# Patient Record
Sex: Female | Born: 1935 | Race: White | Hispanic: No | Smoking: Never smoker
Health system: Southern US, Community
[De-identification: ages and names within clinical notes are randomized; demographics above are authoritative.]

## PROBLEM LIST (undated history)

## (undated) DIAGNOSIS — I519 Heart disease, unspecified: Secondary | ICD-10-CM

## (undated) DIAGNOSIS — Z950 Presence of cardiac pacemaker: Secondary | ICD-10-CM

## (undated) DIAGNOSIS — Z8619 Personal history of other infectious and parasitic diseases: Secondary | ICD-10-CM

## (undated) DIAGNOSIS — I10 Essential (primary) hypertension: Secondary | ICD-10-CM

## (undated) DIAGNOSIS — E785 Hyperlipidemia, unspecified: Secondary | ICD-10-CM

## (undated) DIAGNOSIS — M1990 Unspecified osteoarthritis, unspecified site: Secondary | ICD-10-CM

## (undated) DIAGNOSIS — C801 Malignant (primary) neoplasm, unspecified: Secondary | ICD-10-CM

## (undated) DIAGNOSIS — T7840XA Allergy, unspecified, initial encounter: Secondary | ICD-10-CM

## (undated) DIAGNOSIS — K635 Polyp of colon: Secondary | ICD-10-CM

## (undated) HISTORY — DX: Personal history of other infectious and parasitic diseases: Z86.19

## (undated) HISTORY — DX: Allergy, unspecified, initial encounter: T78.40XA

## (undated) HISTORY — PX: EYE SURGERY: SHX253

## (undated) HISTORY — DX: Heart disease, unspecified: I51.9

## (undated) HISTORY — DX: Unspecified osteoarthritis, unspecified site: M19.90

## (undated) HISTORY — DX: Polyp of colon: K63.5

## (undated) HISTORY — DX: Essential (primary) hypertension: I10

## (undated) HISTORY — PX: SPINE SURGERY: SHX786

## (undated) HISTORY — DX: Hyperlipidemia, unspecified: E78.5

## (undated) HISTORY — DX: Malignant (primary) neoplasm, unspecified: C80.1

---
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
## 1968-04-06 HISTORY — PX: TUBAL LIGATION: SHX77

## 2000-11-02 LAB — HM COLONOSCOPY

## 2003-09-28 HISTORY — PX: BREAST BIOPSY: SHX20

## 2005-11-25 HISTORY — PX: CATARACT EXTRACTION: SUR2

## 2007-01-19 LAB — HM COLONOSCOPY

## 2007-03-08 HISTORY — PX: ANGIOPLASTY: SHX39

## 2012-11-24 HISTORY — PX: CERVICAL SPINE SURGERY: SHX589

## 2016-04-08 DIAGNOSIS — I10 Essential (primary) hypertension: Secondary | ICD-10-CM | POA: Diagnosis not present

## 2016-04-08 DIAGNOSIS — M542 Cervicalgia: Secondary | ICD-10-CM | POA: Diagnosis not present

## 2016-04-08 DIAGNOSIS — E785 Hyperlipidemia, unspecified: Secondary | ICD-10-CM | POA: Diagnosis not present

## 2016-04-20 DIAGNOSIS — I10 Essential (primary) hypertension: Secondary | ICD-10-CM | POA: Diagnosis not present

## 2016-04-20 DIAGNOSIS — E785 Hyperlipidemia, unspecified: Secondary | ICD-10-CM | POA: Diagnosis not present

## 2016-09-16 DIAGNOSIS — M542 Cervicalgia: Secondary | ICD-10-CM | POA: Diagnosis not present

## 2016-09-16 DIAGNOSIS — I10 Essential (primary) hypertension: Secondary | ICD-10-CM | POA: Insufficient documentation

## 2016-09-16 DIAGNOSIS — G8929 Other chronic pain: Secondary | ICD-10-CM | POA: Diagnosis not present

## 2016-09-16 DIAGNOSIS — M545 Low back pain: Secondary | ICD-10-CM | POA: Diagnosis not present

## 2016-09-16 DIAGNOSIS — E785 Hyperlipidemia, unspecified: Secondary | ICD-10-CM | POA: Diagnosis not present

## 2016-09-16 DIAGNOSIS — J301 Allergic rhinitis due to pollen: Secondary | ICD-10-CM | POA: Insufficient documentation

## 2016-09-16 DIAGNOSIS — C4490 Unspecified malignant neoplasm of skin, unspecified: Secondary | ICD-10-CM | POA: Insufficient documentation

## 2016-10-12 DIAGNOSIS — I10 Essential (primary) hypertension: Secondary | ICD-10-CM | POA: Diagnosis not present

## 2016-10-12 DIAGNOSIS — E785 Hyperlipidemia, unspecified: Secondary | ICD-10-CM | POA: Diagnosis not present

## 2016-10-12 LAB — BASIC METABOLIC PANEL
BUN: 19 (ref 4–21)
CREATININE: 0.7 (ref 0.5–1.1)
Glucose: 95
POTASSIUM: 5.2 (ref 3.4–5.3)
SODIUM: 140 (ref 137–147)

## 2016-10-12 LAB — LIPID PANEL
CHOLESTEROL: 137 (ref 0–200)
HDL: 62 (ref 35–70)
LDL Cholesterol: 61
TRIGLYCERIDES: 77 (ref 40–160)

## 2016-10-12 LAB — HEPATIC FUNCTION PANEL
ALT: 33 (ref 7–35)
AST: 35 (ref 13–35)
Alkaline Phosphatase: 57 (ref 25–125)
Bilirubin, Total: 0.4

## 2016-10-12 LAB — CBC AND DIFFERENTIAL
HCT: 37 (ref 36–46)
Hemoglobin: 12.5 (ref 12.0–16.0)
WBC: 4.8

## 2016-11-05 DIAGNOSIS — H15122 Nodular episcleritis, left eye: Secondary | ICD-10-CM | POA: Diagnosis not present

## 2017-01-05 DIAGNOSIS — R69 Illness, unspecified: Secondary | ICD-10-CM | POA: Diagnosis not present

## 2017-03-09 DIAGNOSIS — J069 Acute upper respiratory infection, unspecified: Secondary | ICD-10-CM | POA: Diagnosis not present

## 2017-03-12 DIAGNOSIS — J069 Acute upper respiratory infection, unspecified: Secondary | ICD-10-CM | POA: Diagnosis not present

## 2017-03-17 DIAGNOSIS — J301 Allergic rhinitis due to pollen: Secondary | ICD-10-CM | POA: Diagnosis not present

## 2017-03-17 DIAGNOSIS — E785 Hyperlipidemia, unspecified: Secondary | ICD-10-CM | POA: Diagnosis not present

## 2017-03-17 DIAGNOSIS — I10 Essential (primary) hypertension: Secondary | ICD-10-CM | POA: Diagnosis not present

## 2017-03-17 DIAGNOSIS — M545 Low back pain: Secondary | ICD-10-CM | POA: Diagnosis not present

## 2017-03-17 DIAGNOSIS — G8929 Other chronic pain: Secondary | ICD-10-CM | POA: Diagnosis not present

## 2017-03-17 DIAGNOSIS — M542 Cervicalgia: Secondary | ICD-10-CM | POA: Diagnosis not present

## 2017-03-23 DIAGNOSIS — I10 Essential (primary) hypertension: Secondary | ICD-10-CM | POA: Diagnosis not present

## 2017-03-23 DIAGNOSIS — E785 Hyperlipidemia, unspecified: Secondary | ICD-10-CM | POA: Diagnosis not present

## 2017-03-24 LAB — BASIC METABOLIC PANEL
BUN: 25 — AB (ref 4–21)
CREATININE: 0.8 (ref 0.5–1.1)
Glucose: 91
Potassium: 4.6 (ref 3.4–5.3)
Sodium: 140 (ref 137–147)

## 2017-03-24 LAB — HEPATIC FUNCTION PANEL
ALK PHOS: 58 (ref 25–125)
ALT: 27 (ref 7–35)
AST: 38 — AB (ref 13–35)
BILIRUBIN, TOTAL: 0.4

## 2017-03-24 LAB — LIPID PANEL
Cholesterol: 134 (ref 0–200)
HDL: 59 (ref 35–70)
LDL Cholesterol: 66
TRIGLYCERIDES: 58 (ref 40–160)

## 2017-03-24 LAB — CBC AND DIFFERENTIAL
HCT: 37 (ref 36–46)
Hemoglobin: 12.1 (ref 12.0–16.0)
Neutrophils Absolute: 2
PLATELETS: 255 (ref 150–399)
WBC: 3.9

## 2017-05-12 ENCOUNTER — Encounter: Payer: Self-pay | Admitting: Emergency Medicine

## 2017-05-12 DIAGNOSIS — M545 Low back pain, unspecified: Secondary | ICD-10-CM

## 2017-05-12 DIAGNOSIS — J301 Allergic rhinitis due to pollen: Secondary | ICD-10-CM

## 2017-05-12 DIAGNOSIS — E785 Hyperlipidemia, unspecified: Secondary | ICD-10-CM

## 2017-05-12 DIAGNOSIS — M542 Cervicalgia: Secondary | ICD-10-CM

## 2017-05-12 DIAGNOSIS — I10 Essential (primary) hypertension: Secondary | ICD-10-CM | POA: Insufficient documentation

## 2017-05-12 DIAGNOSIS — G8929 Other chronic pain: Secondary | ICD-10-CM | POA: Insufficient documentation

## 2017-05-12 DIAGNOSIS — J302 Other seasonal allergic rhinitis: Secondary | ICD-10-CM | POA: Insufficient documentation

## 2017-05-14 ENCOUNTER — Ambulatory Visit (INDEPENDENT_AMBULATORY_CARE_PROVIDER_SITE_OTHER): Payer: Medicare HMO | Admitting: Physician Assistant

## 2017-05-14 ENCOUNTER — Other Ambulatory Visit: Payer: Self-pay

## 2017-05-14 ENCOUNTER — Encounter: Payer: Self-pay | Admitting: Physician Assistant

## 2017-05-14 VITALS — BP 130/70 | HR 64 | Temp 98.2°F | Resp 14 | Ht 61.5 in | Wt 111.0 lb

## 2017-05-14 DIAGNOSIS — E785 Hyperlipidemia, unspecified: Secondary | ICD-10-CM

## 2017-05-14 DIAGNOSIS — I10 Essential (primary) hypertension: Secondary | ICD-10-CM

## 2017-05-14 NOTE — Progress Notes (Signed)
Patient presents to clinic today to establish care.  Acute Concerns: Denies acute concerns today.  Chronic Issues: Hypertension -- Long-standing history. Is currently on a regimen of Carvedilol 12.5 mg BID, Lisinopril 10 mg QD. Is taking daily as directed and denies side effect. Patient denies chest pain, palpitations, lightheadedness, dizziness, vision changes or frequent headaches.  Hyperlipidemia -- Is currently on a regimen of simvastatin 40 mg daily. Is taking as directed. Is keeping a low fat diet and is staying active to get daily exercise. Endorses fasting labs < 1 month ago. Has a copy of labs at home that she will bring in to review. .  Health Maintenance: Immunizations -- up-to-date. Colonoscopy -- Up-to-date. No further screenings needed.  Bone Density --  Unsure of last check. Will obtain and review records.   Past Medical History:  Diagnosis Date  . Allergy   . Arthritis   . Cancer (HCC)    Skin  . Colon polyps   . Heart disease   . History of chickenpox   . Hyperlipidemia   . Hypertension     Past Surgical History:  Procedure Laterality Date  . BREAST BIOPSY    . CATARACT EXTRACTION    . CERVICAL SPINE SURGERY    . TONSILLECTOMY    . TUBAL LIGATION      Current Outpatient Medications on File Prior to Visit  Medication Sig Dispense Refill  . aspirin 81 MG EC tablet aspirin 81 mg tablet,delayed release  Take 1 tablet every day by oral route.    . carvedilol (COREG) 12.5 MG tablet Take 12.5 mg by mouth 2 (two) times daily with a meal.    . fluticasone (FLONASE) 50 MCG/ACT nasal spray Place 1 spray into both nostrils daily.    Marland Kitchen ketotifen (ZADITOR) 0.025 % ophthalmic solution Place 1 drop into both eyes 2 (two) times daily.    Marland Kitchen lisinopril (PRINIVIL,ZESTRIL) 10 MG tablet Take 10 mg by mouth 2 (two) times daily.    . prednisoLONE acetate (PREDNISOLONE ACETATE P-F) 1 % ophthalmic suspension Place 1 drop into the right eye 4 (four) times daily.    .  simvastatin (ZOCOR) 40 MG tablet Take 40 mg by mouth daily.    . traMADol (ULTRAM) 50 MG tablet Take 50 mg by mouth every 8 (eight) hours as needed.     No current facility-administered medications on file prior to visit.     Allergies  Allergen Reactions  . Nickel   . Penicillins Rash  . Sulfa Antibiotics Rash    Family History  Problem Relation Age of Onset  . Hearing loss Mother   . Hypertension Mother   . Osteoporosis Mother   . Arthritis Mother   . Cancer Father        Skin  . Hearing loss Father   . Arthritis Father   . Diabetes Brother   . Stroke Brother   . Early death Brother   . Depression Daughter   . Early death Son   . Early death Maternal Grandfather   . Early death Paternal Grandfather     Social History   Socioeconomic History  . Marital status: Divorced    Spouse name: Not on file  . Number of children: Not on file  . Years of education: Not on file  . Highest education level: Not on file  Social Needs  . Financial resource strain: Not on file  . Food insecurity - worry: Not on file  . Food insecurity -  inability: Not on file  . Transportation needs - medical: Not on file  . Transportation needs - non-medical: Not on file  Occupational History  . Occupation: Retired  Tobacco Use  . Smoking status: Never Smoker  . Smokeless tobacco: Never Used  Substance and Sexual Activity  . Alcohol use: No    Frequency: Never  . Drug use: No  . Sexual activity: Not Currently  Other Topics Concern  . Not on file  Social History Narrative  . Not on file    Review of Systems  Constitutional: Negative for fever and weight loss.  HENT: Negative for ear discharge, ear pain, hearing loss and tinnitus.   Eyes: Negative for blurred vision, double vision, photophobia and pain.  Respiratory: Negative for cough and shortness of breath.   Cardiovascular: Negative for chest pain and palpitations.  Gastrointestinal: Negative for abdominal pain, blood in stool,  constipation, diarrhea, heartburn, melena, nausea and vomiting.  Genitourinary: Negative for dysuria, flank pain, frequency, hematuria and urgency.  Musculoskeletal: Positive for neck pain (chronic). Negative for falls.  Neurological: Negative for dizziness, loss of consciousness and headaches.  Endo/Heme/Allergies: Negative for environmental allergies.  Psychiatric/Behavioral: Negative for depression, hallucinations, substance abuse and suicidal ideas. The patient is not nervous/anxious and does not have insomnia.    BP 130/70   Pulse 64   Temp 98.2 F (36.8 C) (Oral)   Resp 14   Ht 5' 1.5" (1.562 m)   Wt 111 lb (50.3 kg)   SpO2 98%   BMI 20.63 kg/m   Physical Exam  Constitutional: She is oriented to person, place, and time and well-developed, well-nourished, and in no distress.  HENT:  Head: Normocephalic and atraumatic.  Eyes: Conjunctivae are normal.  Neck: Neck supple.  Cardiovascular: Normal rate, regular rhythm, normal heart sounds and intact distal pulses.  Pulmonary/Chest: Effort normal and breath sounds normal. No respiratory distress. She has no wheezes. She has no rales. She exhibits no tenderness.  Abdominal: Soft. Bowel sounds are normal. She exhibits no distension. There is no tenderness.  Neurological: She is alert and oriented to person, place, and time. No cranial nerve deficit.  Skin: Skin is warm and dry. No rash noted.  Psychiatric: Affect normal.  Vitals reviewed.  Assessment/Plan: Essential hypertension BP normotensive. Asymptomatic. Continue current antihypertensive regimen along with 81 mg ASA daily. She will bring in copy of recent labs for review.   Hyperlipidemia Continue statin. Dietary and exercise recommendations reviewed. Body mass index is 20.63 kg/m. Will have her bring in copy of recent lab results for review. Will alter treatment accordingly.     Leeanne Rio, PA-C

## 2017-05-14 NOTE — Patient Instructions (Signed)
Please continue medications as directed. Consider starting a turmeric supplement on Mon, Wed and Fri to help with joints.  Keep well-hydrated and stay active.  Please bring me a copy of your recent lab results so I can review them! We will get old records to review.   Follow-up when due for your Medicare Physical.

## 2017-05-15 NOTE — Assessment & Plan Note (Signed)
Continue statin. Dietary and exercise recommendations reviewed. Body mass index is 20.63 kg/m. Will have her bring in copy of recent lab results for review. Will alter treatment accordingly.

## 2017-05-15 NOTE — Assessment & Plan Note (Signed)
BP normotensive. Asymptomatic. Continue current antihypertensive regimen along with 81 mg ASA daily. She will bring in copy of recent labs for review.

## 2017-05-19 DIAGNOSIS — Z961 Presence of intraocular lens: Secondary | ICD-10-CM | POA: Diagnosis not present

## 2017-05-19 DIAGNOSIS — H1045 Other chronic allergic conjunctivitis: Secondary | ICD-10-CM | POA: Diagnosis not present

## 2017-06-01 ENCOUNTER — Encounter: Payer: Self-pay | Admitting: Emergency Medicine

## 2017-06-04 ENCOUNTER — Encounter: Payer: Self-pay | Admitting: Physician Assistant

## 2017-07-26 DIAGNOSIS — H1011 Acute atopic conjunctivitis, right eye: Secondary | ICD-10-CM | POA: Diagnosis not present

## 2017-08-20 ENCOUNTER — Other Ambulatory Visit: Payer: Self-pay | Admitting: Emergency Medicine

## 2017-08-20 ENCOUNTER — Encounter: Payer: Self-pay | Admitting: Physician Assistant

## 2017-08-20 MED ORDER — LISINOPRIL 10 MG PO TABS: 10.0000 mg | ORAL_TABLET | Freq: Two times a day (BID) | ORAL | 1 refills | Status: DC

## 2017-08-20 MED ORDER — CARVEDILOL 12.5 MG PO TABS: 12.5000 mg | ORAL_TABLET | Freq: Two times a day (BID) | ORAL | 1 refills | Status: DC

## 2017-10-04 ENCOUNTER — Encounter: Payer: Self-pay | Admitting: Physician Assistant

## 2017-10-04 ENCOUNTER — Other Ambulatory Visit: Payer: Self-pay | Admitting: Emergency Medicine

## 2017-10-04 MED ORDER — SIMVASTATIN 40 MG PO TABS: 40.0000 mg | ORAL_TABLET | Freq: Every day | ORAL | 1 refills | Status: DC

## 2017-11-01 ENCOUNTER — Encounter: Payer: Self-pay | Admitting: Physician Assistant

## 2017-11-01 NOTE — Telephone Encounter (Signed)
Medication has not been filled by PCP.  Please advise of medication refill of the Tramadol for chronic neck pain

## 2017-11-01 NOTE — Telephone Encounter (Signed)
For [place] to take over, patient will need to come in to discuss ongoing neck issues and need for medicine so that we can have documentation supporting my prescribing on the medication due to state laws. I am happy to refill for her after we discuss in detail and get her contract signed.

## 2017-11-03 ENCOUNTER — Encounter: Payer: Self-pay | Admitting: Physician Assistant

## 2017-11-03 ENCOUNTER — Ambulatory Visit (INDEPENDENT_AMBULATORY_CARE_PROVIDER_SITE_OTHER): Payer: Medicare HMO | Admitting: Physician Assistant

## 2017-11-03 ENCOUNTER — Other Ambulatory Visit: Payer: Self-pay

## 2017-11-03 VITALS — BP 133/76 | HR 71 | Temp 98.7°F | Resp 17 | Ht 62.0 in | Wt 111.4 lb

## 2017-11-03 DIAGNOSIS — G8929 Other chronic pain: Secondary | ICD-10-CM

## 2017-11-03 DIAGNOSIS — M545 Low back pain: Secondary | ICD-10-CM

## 2017-11-03 DIAGNOSIS — M542 Cervicalgia: Secondary | ICD-10-CM

## 2017-11-03 NOTE — Patient Instructions (Addendum)
I am taking over the prescribing of your Tramadol. I will want to see you every 6 months to continue this.   I will set you up for a Bone Density test with [facility].  Take Care!

## 2017-11-03 NOTE — Progress Notes (Signed)
Reviewed and updated  Today.  Indication for chronic opioid:  Medication and dose: Tramadol # pills per month: 90 Last UDS date: None. Needed today. Pain contract signed (date): 11/03/2017 Date narcotic database last reviewed (include red flags): 11/03/2017  Pain Inventory (1-10 worse): Average Pain -4/5 Pain Right Now -3 My pain is aching (character i.e. sharp, stabbing, dull, constant etc)  Pain is worse with: certain positioning.  Relief from Meds: good relief.   In the last 24 hours, has pain interfered with the following (1-10 greatest interference) ? General activity - 2 Relation with others - 2 Enjoyment of life - 2 What TIME of day is your pain at its worst? Evening and early AM.  Sleep (in general) - 3-4  Mobility/Function: Assistance device: none How many minutes can you walk? 15-30 Ability to climb steps?  yes Do you drive? yes  Neuro/Psych Sx: (bladder, bowel, weakness, dizziness, depression etc) None currently  Prior Studies: See EMR  Physicians involved in your care: Any changes since last visit?  None  Past Medical History:  Diagnosis Date  . Allergy   . Arthritis   . Cancer (HCC)    Skin  . Colon polyps   . Heart disease   . History of chickenpox   . Hyperlipidemia   . Hypertension     Current Outpatient Medications on File Prior to Visit  Medication Sig Dispense Refill  . aspirin 81 MG EC tablet aspirin 81 mg tablet,delayed release  Take 1 tablet every day by oral route.    . carvedilol (COREG) 12.5 MG tablet Take 1 tablet (12.5 mg total) by mouth 2 (two) times daily with a meal. 180 tablet 1  . fluticasone (FLONASE) 50 MCG/ACT nasal spray Place 1 spray into both nostrils daily.    Marland Kitchen ketotifen (ZADITOR) 0.025 % ophthalmic solution Place 1 drop into both eyes 2 (two) times daily.    Marland Kitchen lisinopril (PRINIVIL,ZESTRIL) 10 MG tablet Take 1 tablet (10 mg total) by mouth 2 (two) times daily. 180 tablet 1  . prednisoLONE acetate (PREDNISOLONE  ACETATE P-F) 1 % ophthalmic suspension Place 1 drop into the right eye 4 (four) times daily.    . simvastatin (ZOCOR) 40 MG tablet Take 1 tablet (40 mg total) by mouth daily. 90 tablet 1  . traMADol (ULTRAM) 50 MG tablet Take 50 mg by mouth every 8 (eight) hours as needed.     No current facility-administered medications on file prior to visit.     Allergies  Allergen Reactions  . Nickel   . Penicillins Rash  . Sulfa Antibiotics Rash    Family History  Problem Relation Age of Onset  . Hearing loss Mother   . Hypertension Mother   . Osteoporosis Mother   . Arthritis Mother   . Cancer Father        Skin  . Hearing loss Father   . Arthritis Father   . Diabetes Brother   . Stroke Brother   . Early death Brother   . Depression Daughter   . Early death Son   . Early death Maternal Grandfather   . Early death Paternal Grandfather     Social History   Socioeconomic History  . Marital status: Divorced    Spouse name: Not on file  . Number of children: Not on file  . Years of education: Not on file  . Highest education level: Not on file  Occupational History  . Occupation: Retired  Scientific laboratory technician  . Financial  resource strain: Not on file  . Food insecurity:    Worry: Not on file    Inability: Not on file  . Transportation needs:    Medical: Not on file    Non-medical: Not on file  Tobacco Use  . Smoking status: Never Smoker  . Smokeless tobacco: Never Used  Substance and Sexual Activity  . Alcohol use: No    Frequency: Never  . Drug use: No  . Sexual activity: Not Currently  Lifestyle  . Physical activity:    Days per week: Not on file    Minutes per session: Not on file  . Stress: Not on file  Relationships  . Social connections:    Talks on phone: Not on file    Gets together: Not on file    Attends religious service: Not on file    Active member of club or organization: Not on file    Attends meetings of clubs or organizations: Not on file     Relationship status: Not on file  Other Topics Concern  . Not on file  Social History Narrative  . Not on file   Review of Systems - See HPI.  All other ROS are negative.  BP 133/76   Pulse 71   Temp 98.7 F (37.1 C) (Oral)   Resp 17   Ht 5\' 2"  (1.575 m)   Wt 111 lb 6 oz (50.5 kg)   SpO2 98%   BMI 20.37 kg/m   Physical Exam  Constitutional: She is oriented to person, place, and time. She appears well-developed and well-nourished.  HENT:  Head: Normocephalic and atraumatic.  Right Ear: External ear normal.  Left Ear: External ear normal.  Mouth/Throat: Oropharynx is clear and moist.  Eyes: Conjunctivae are normal.  Neck: Neck supple.  Cardiovascular: Normal rate, regular rhythm and normal heart sounds.  Pulmonary/Chest: Effort normal.  Neurological: She is alert and oriented to person, place, and time.  Psychiatric: She has a normal mood and affect.  Vitals reviewed.  Assessment/Plan: 1. Chronic neck pain 2. Chronic low back pain without sciatica, unspecified back pain laterality Stablet. Will take over Rx Tramadol. CSC signed. CS Database reviewed with no red flags. Follow-up 6 months as Tramadol is schedule IV.   Leeanne Rio, PA-C

## 2017-11-05 ENCOUNTER — Other Ambulatory Visit: Payer: Self-pay | Admitting: Physician Assistant

## 2017-11-05 ENCOUNTER — Encounter: Payer: Self-pay | Admitting: Emergency Medicine

## 2017-11-05 DIAGNOSIS — N959 Unspecified menopausal and perimenopausal disorder: Secondary | ICD-10-CM

## 2017-11-08 MED ORDER — TRAMADOL HCL 50 MG PO TABS: 50.0000 mg | ORAL_TABLET | Freq: Three times a day (TID) | ORAL | 0 refills | Status: DC | PRN

## 2017-11-11 ENCOUNTER — Other Ambulatory Visit: Payer: Self-pay | Admitting: Physician Assistant

## 2017-11-11 DIAGNOSIS — E2839 Other primary ovarian failure: Secondary | ICD-10-CM

## 2017-12-11 ENCOUNTER — Encounter: Payer: Self-pay | Admitting: Physician Assistant

## 2017-12-13 MED ORDER — TRAMADOL HCL 50 MG PO TABS: 50.0000 mg | ORAL_TABLET | Freq: Three times a day (TID) | ORAL | 0 refills | Status: DC | PRN

## 2018-01-03 ENCOUNTER — Encounter: Payer: Self-pay | Admitting: Physician Assistant

## 2018-01-03 ENCOUNTER — Other Ambulatory Visit: Payer: Self-pay

## 2018-01-03 ENCOUNTER — Ambulatory Visit (INDEPENDENT_AMBULATORY_CARE_PROVIDER_SITE_OTHER): Payer: Medicare HMO | Admitting: Physician Assistant

## 2018-01-03 VITALS — BP 132/70 | HR 74 | Temp 97.7°F | Resp 16 | Ht 62.0 in | Wt 110.0 lb

## 2018-01-03 DIAGNOSIS — M542 Cervicalgia: Secondary | ICD-10-CM

## 2018-01-03 DIAGNOSIS — Z23 Encounter for immunization: Secondary | ICD-10-CM

## 2018-01-03 MED ORDER — TRAMADOL HCL ER 100 MG PO TB24: 100.0000 mg | ORAL_TABLET | Freq: Every day | ORAL | 0 refills | Status: DC

## 2018-01-03 NOTE — Progress Notes (Signed)
Patient presents to clinic today c/o deteriorating cervical neck pain. Patient with history of arthritis, previously well-controlled with Tramdol as needed. Over the past few months, notes that medication has become less therapeutic. Notes breakthrough symptoms with medication. Denies trauma or injury. Denies radiation of pain into extremities. Denies numbness, tingling or weakness of extremity.   Past Medical History:  Diagnosis Date  . Allergy   . Arthritis   . Cancer (HCC)    Skin  . Colon polyps   . Heart disease   . History of chickenpox   . Hyperlipidemia   . Hypertension     Current Outpatient Medications on File Prior to Visit  Medication Sig Dispense Refill  . aspirin 81 MG EC tablet aspirin 81 mg tablet,delayed release  Take 1 tablet every day by oral route.    . carvedilol (COREG) 12.5 MG tablet Take 1 tablet (12.5 mg total) by mouth 2 (two) times daily with a meal. 180 tablet 1  . fluticasone (FLONASE) 50 MCG/ACT nasal spray Place 1 spray into both nostrils daily.    Marland Kitchen ketotifen (ZADITOR) 0.025 % ophthalmic solution Place 1 drop into both eyes 2 (two) times daily.    Marland Kitchen lisinopril (PRINIVIL,ZESTRIL) 10 MG tablet Take 1 tablet (10 mg total) by mouth 2 (two) times daily. 180 tablet 1  . simvastatin (ZOCOR) 40 MG tablet Take 1 tablet (40 mg total) by mouth daily. 90 tablet 1  . prednisoLONE acetate (PREDNISOLONE ACETATE P-F) 1 % ophthalmic suspension Place 1 drop into the right eye 4 (four) times daily.     No current facility-administered medications on file prior to visit.     Allergies  Allergen Reactions  . Nickel   . Penicillins Rash  . Sulfa Antibiotics Rash    Family History  Problem Relation Age of Onset  . Hearing loss Mother   . Hypertension Mother   . Osteoporosis Mother   . Arthritis Mother   . Cancer Father        Skin  . Hearing loss Father   . Arthritis Father   . Diabetes Brother   . Stroke Brother   . Early death Brother   . Depression  Daughter   . Early death Son   . Early death Maternal Grandfather   . Early death Paternal Grandfather     Social History   Socioeconomic History  . Marital status: Divorced    Spouse name: Not on file  . Number of children: Not on file  . Years of education: Not on file  . Highest education level: Not on file  Occupational History  . Occupation: Retired  Scientific laboratory technician  . Financial resource strain: Not on file  . Food insecurity:    Worry: Not on file    Inability: Not on file  . Transportation needs:    Medical: Not on file    Non-medical: Not on file  Tobacco Use  . Smoking status: Never Smoker  . Smokeless tobacco: Never Used  Substance and Sexual Activity  . Alcohol use: No    Frequency: Never  . Drug use: No  . Sexual activity: Not Currently  Lifestyle  . Physical activity:    Days per week: Not on file    Minutes per session: Not on file  . Stress: Not on file  Relationships  . Social connections:    Talks on phone: Not on file    Gets together: Not on file    Attends religious service: Not  on file    Active member of club or organization: Not on file    Attends meetings of clubs or organizations: Not on file    Relationship status: Not on file  Other Topics Concern  . Not on file  Social History Narrative  . Not on file   Review of Systems - See HPI.  All other ROS are negative.  BP 132/70   Pulse 74   Temp 97.7 F (36.5 C) (Oral)   Resp 16   Ht 5\' 2"  (1.575 m)   Wt 110 lb (49.9 kg)   SpO2 99%   BMI 20.12 kg/m   Physical Exam  Constitutional: She appears well-developed and well-nourished.  HENT:  Head: Normocephalic and atraumatic.  Eyes: Pupils are equal, round, and reactive to light. Conjunctivae and EOM are normal.  Neck: Normal range of motion. Neck supple.  Cardiovascular: Normal rate, regular rhythm, normal heart sounds and intact distal pulses.  Lymphadenopathy:    She has no cervical adenopathy.  Psychiatric: She has a normal mood  and affect.  Vitals reviewed.  Assessment/Plan: 1. Cervicalgia Exam overall unremarkable. Feel that symptoms are related to deteriorating arthritic changes. Will check x-ray today. Stop Tramadol. Start Tramadol ER 100 mg daily. Will adjust further once x-ray results are in.  - DG Cervical Spine Complete; Future - traMADol (ULTRAM-ER) 100 MG 24 hr tablet; Take 1 tablet (100 mg total) by mouth daily.  Dispense: 30 tablet; Refill: 0  2. Encounter for immunization High-dose flu given today. - Flu vaccine HIGH DOSE PF    Leeanne Rio, PA-C

## 2018-01-03 NOTE — Patient Instructions (Signed)
Please go to the [place] office at [place] for x-ray. I will call with results and we will make further changes.   For now stop the current prescription for Tramadol and start the new prescription for the extended release version.  Let me know how this works for you. We will follow-up in 3-4 weeks.

## 2018-01-04 ENCOUNTER — Other Ambulatory Visit: Payer: Self-pay | Admitting: Physician Assistant

## 2018-01-04 ENCOUNTER — Ambulatory Visit (INDEPENDENT_AMBULATORY_CARE_PROVIDER_SITE_OTHER): Payer: Medicare HMO

## 2018-01-04 DIAGNOSIS — G8929 Other chronic pain: Secondary | ICD-10-CM

## 2018-01-04 DIAGNOSIS — M542 Cervicalgia: Secondary | ICD-10-CM

## 2018-01-04 DIAGNOSIS — M545 Low back pain: Secondary | ICD-10-CM

## 2018-01-06 ENCOUNTER — Encounter: Payer: Self-pay | Admitting: Physician Assistant

## 2018-01-19 ENCOUNTER — Other Ambulatory Visit: Payer: Self-pay | Admitting: Physician Assistant

## 2018-01-19 DIAGNOSIS — M542 Cervicalgia: Secondary | ICD-10-CM

## 2018-01-19 NOTE — Telephone Encounter (Signed)
Pt calling back

## 2018-01-19 NOTE — Telephone Encounter (Signed)
Called patient and lmovm. We need  In reviewing patient chart On 01/03/18 was taking Tramadol 50 mg q 8hrs-not helping with pain control Medication changed to Tramadol ER 100 mg daily-not helping with pain control Increased Tramadol ER 100 mg 2 tab daily-no lasting long enough for pain control  Patient wants to switch back to Tramadol 50 mg every 6 hrs for better pain control. Or take 250 mg per day. We need clarification while PCP is out of the office

## 2018-01-20 NOTE — Telephone Encounter (Signed)
Patient wants Tramadol 200MG  (extended release) daily. She states if it needs to be the 100mg  tablet she cant take 2 daily.

## 2018-01-20 NOTE — Telephone Encounter (Signed)
LM asking for patient to call.  Need the questions answered that the provider has typed below.    Okay for PEC to discuss this with patient to let us know exactly which one patient is needing.

## 2018-01-21 MED ORDER — TRAMADOL HCL ER 200 MG PO TB24: 200.0000 mg | ORAL_TABLET | Freq: Every day | ORAL | 0 refills | Status: DC

## 2018-01-21 NOTE — Addendum Note (Signed)
Addended by: Midge Minium on: 01/21/2018 09:07 AM   Modules accepted: Orders

## 2018-01-21 NOTE — Telephone Encounter (Signed)
Prescription for Tramadol XR 200mg  sent to pharmacy and should be available for pick up.

## 2018-02-07 ENCOUNTER — Encounter: Payer: Self-pay | Admitting: Physician Assistant

## 2018-02-08 MED ORDER — TRAMADOL HCL 50 MG PO TABS: 50.0000 mg | ORAL_TABLET | Freq: Four times a day (QID) | ORAL | 0 refills | Status: DC | PRN

## 2018-02-14 ENCOUNTER — Ambulatory Visit (INDEPENDENT_AMBULATORY_CARE_PROVIDER_SITE_OTHER): Payer: Self-pay | Admitting: Orthopaedic Surgery

## 2018-02-21 ENCOUNTER — Ambulatory Visit (INDEPENDENT_AMBULATORY_CARE_PROVIDER_SITE_OTHER): Payer: MEDICARE

## 2018-02-21 ENCOUNTER — Encounter (INDEPENDENT_AMBULATORY_CARE_PROVIDER_SITE_OTHER): Payer: Self-pay | Admitting: Orthopaedic Surgery

## 2018-02-21 ENCOUNTER — Ambulatory Visit (INDEPENDENT_AMBULATORY_CARE_PROVIDER_SITE_OTHER): Payer: MEDICARE | Admitting: Orthopaedic Surgery

## 2018-02-21 VITALS — BP 134/66 | HR 74 | Resp 18 | Ht 61.5 in | Wt 110.0 lb

## 2018-02-21 DIAGNOSIS — G8929 Other chronic pain: Secondary | ICD-10-CM

## 2018-02-21 DIAGNOSIS — M5442 Lumbago with sciatica, left side: Secondary | ICD-10-CM

## 2018-02-21 DIAGNOSIS — M5441 Lumbago with sciatica, right side: Secondary | ICD-10-CM

## 2018-02-21 DIAGNOSIS — M542 Cervicalgia: Secondary | ICD-10-CM | POA: Diagnosis not present

## 2018-02-21 NOTE — Progress Notes (Signed)
Office Visit Note   Patient: Bethany Carpenter           Date of Birth: [birth year]           MRN: [id number] Visit Date: 02/21/2018              Requested by: Brunetta Jeans, PA-C [id number] [place], [place] [address] PCP: Brunetta Jeans, PA-C   Assessment & Plan: Visit Diagnoses:  1. Chronic bilateral low back pain with bilateral sciatica   2. 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  Symptoms are also consistent with spinal stenosis and claudication.  Has prior instrumentation of cervical spine from C2-T2 without obvious complication.  We will try a course of physical therapy for her neck and her low back and then reevaluate over the next 4 to 6 weeks.  Consider MRI scan of lumbar spine if still having trouble.  Has extensive instrumentation of the cervical spine many years ago while living in [place].  I am sure she has some residual arthritis above and below the instrumentation that hopefully will respond to therapy.  Low back pain is related to her degenerative arthritis and possible stenosis.  Would consider to the MRI scan if no provement  Follow-Up Instructions: Return in about 6 weeks (around 04/04/2018).   Orders:  Orders Placed This Encounter  Procedures  . XR Lumbar Spine 2-3 Views  . Ambulatory referral to Physical Therapy   No orders of the defined types were placed in this encounter.     Procedures: No procedures performed   Clinical Data: No additional findings.   Subjective: Chief Complaint  Patient presents with  . Lower Back - Pain  . Back Pain    Low back pain, bil posterior thigh pain, increased tramadol - helping,   Bethany Carpenter is 84 years old and visits the office for evaluation of chronic neck and low back pain.  Approximately 10 years ago she underwent cervical spine instrumentation while living in [facility].  She is done fairly well since that time with occasional ache or pain in the upper  thoracic region.  She is not sure if she is had physical therapy.  She has been on chronic tramadol with good relief of her pain.  She is not having any referred pain to either upper extremity.  She also notes that she is had some some chronic low back pain with some pain referred to her buttock and both of her posterior thighs particularly when she stands or walks for a length of time.  No numbness or tingling.  She is still very active and likes to walk and work in her garden and only has minimal problems. She has had recent x-rays of her cervical spine per her primary care physician these were reviewed on the PACS system.  She has posterior instrumentation from about C2 to the second thoracic vertebrae.  No obvious hardware complications.  HPI  Review of Systems  Constitutional: Negative for fatigue.  HENT: Negative for trouble swallowing.   Eyes: Negative for pain.  Respiratory: Negative for shortness of breath.   Cardiovascular: Negative for leg swelling.  Gastrointestinal: Negative for constipation.  Endocrine: Negative for cold intolerance.  Genitourinary: Negative for difficulty urinating.  Musculoskeletal: Positive for back pain and joint swelling.  Skin: Negative for rash.  Allergic/Immunologic: Negative for food allergies.  Neurological: Negative for weakness.  Hematological: Does not bruise/bleed easily.  Psychiatric/Behavioral:  Negative for sleep disturbance.     Objective: Vital Signs: BP 134/66 (BP Location: Left Arm, Patient Position: Sitting, Cuff Size: Normal)   Pulse 74   Resp 18   Ht 5' 1.5" (1.562 m)   Wt 110 lb (49.9 kg)   BMI 20.45 kg/m   Physical Exam  Constitutional: She is oriented to person, place, and time. She appears well-developed and well-nourished.  HENT:  Mouth/Throat: Oropharynx is clear and moist.  Eyes: Pupils are equal, round, and reactive to light. EOM are normal.  Pulmonary/Chest: Effort normal.  Neurological: She is alert and oriented to  person, place, and time.  Skin: Skin is warm and dry.  Psychiatric: She has a normal mood and affect. Her behavior is normal.    Ortho Exam awake alert and oriented x3.  Comfortable sitting.  Straight leg raise negative bilaterally.  Painless range of motion of both hips.  Good strength.  Walks without a limp.  No significant percussible tenderness of the lumbar spine or the sacroiliac joints.  Neurologically intact.  Limited range of motion of cervical spine with no discomfort.  Has about 45 degrees of rotation of the right to the left.  Lacks about 3 fingerbreadths of touching her chin to her chest and only about 40 degrees of normal neck extension related to her instrumentation.  No pain.  Full range of motion of both shoulders elbows with good grip and release of both hands.  Specialty Comments:  No specialty comments available.  Imaging: Xr Lumbar Spine 2-3 Views  Result Date: 02/21/2018 Films of the lumbar spine were obtained in 2 projections.  There is significant degenerative disc disease at L3-4 L4-5 and L5-S1.  There is an anterior listhesis of 3 on 4 and 4 on 5.  Diffuse "facet sclerosis at the same 3 levels.  Diffuse calcification of the abdominal aorta without obvious aneurysmal dilatation.  AP view notes  degenerative left scoliosis    PMFS History: Patient Active Problem List   Diagnosis Date Noted  . Essential hypertension 05/12/2017  . Hyperlipidemia 05/12/2017  . Chronic neck pain 05/12/2017  . Chronic low back pain without sciatica 05/12/2017  . Seasonal allergic rhinitis 05/12/2017  . Skin cancer 09/16/2016   Past Medical History:  Diagnosis Date  . Allergy   . Arthritis   . Cancer (HCC)    Skin  . Colon polyps   . Heart disease   . History of chickenpox   . Hyperlipidemia   . Hypertension     Family History  Problem Relation Age of Onset  . Hearing loss Mother   . Hypertension Mother   . Osteoporosis Mother   . Arthritis Mother   . Cancer Father          Skin  . Hearing loss Father   . Arthritis Father   . Diabetes Brother   . Stroke Brother   . Early death Brother   . Depression Daughter   . Early death Son   . Early death Maternal Grandfather   . Early death Paternal Grandfather     Past Surgical History:  Procedure Laterality Date  . ANGIOPLASTY  03/08/2007  . BREAST BIOPSY  09/28/2003  . CATARACT EXTRACTION  11/25/2005   04/07/2006  . CERVICAL SPINE SURGERY  11/24/2012   02/02/2008  . TONSILLECTOMY  1952  . TUBAL LIGATION  1970   Social History   Occupational History  . Occupation: Retired  Tobacco Use  . Smoking status: Never Smoker  .  Smokeless tobacco: Never Used  Substance and Sexual Activity  . Alcohol use: No    Frequency: Never  . Drug use: No  . Sexual activity: Not Currently

## 2018-02-26 ENCOUNTER — Encounter: Payer: Self-pay | Admitting: Physician Assistant

## 2018-02-28 MED ORDER — CARVEDILOL 12.5 MG PO TABS: 12.5000 mg | ORAL_TABLET | Freq: Two times a day (BID) | ORAL | 1 refills | Status: DC

## 2018-02-28 MED ORDER — LISINOPRIL 10 MG PO TABS: 10.0000 mg | ORAL_TABLET | Freq: Two times a day (BID) | ORAL | 1 refills | Status: DC

## 2018-03-08 ENCOUNTER — Encounter: Payer: Self-pay | Admitting: Physician Assistant

## 2018-03-09 MED ORDER — TRAMADOL HCL 50 MG PO TABS: 50.0000 mg | ORAL_TABLET | Freq: Four times a day (QID) | ORAL | 0 refills | Status: DC | PRN

## 2018-04-12 ENCOUNTER — Encounter: Payer: Self-pay | Admitting: Physician Assistant

## 2018-04-13 MED ORDER — TRAMADOL HCL 50 MG PO TABS: 50.0000 mg | ORAL_TABLET | Freq: Four times a day (QID) | ORAL | 0 refills | Status: DC | PRN

## 2018-04-13 MED ORDER — SIMVASTATIN 40 MG PO TABS: 40.0000 mg | ORAL_TABLET | Freq: Every day | ORAL | 1 refills | Status: DC

## 2018-04-13 NOTE — Telephone Encounter (Signed)
Last rx for Tramadol was filled on 03/09/18 #120 [facility]: 11/03/17 UDS: none  Please advise of refill.  Patient has appointment on 05/02/18

## 2018-04-19 ENCOUNTER — Other Ambulatory Visit: Payer: Self-pay | Admitting: Emergency Medicine

## 2018-04-19 DIAGNOSIS — E785 Hyperlipidemia, unspecified: Secondary | ICD-10-CM

## 2018-04-19 MED ORDER — SIMVASTATIN 40 MG PO TABS: 40.0000 mg | ORAL_TABLET | Freq: Every day | ORAL | 1 refills | Status: DC

## 2018-05-02 ENCOUNTER — Encounter: Payer: Self-pay | Admitting: Physician Assistant

## 2018-05-02 ENCOUNTER — Other Ambulatory Visit: Payer: Self-pay

## 2018-05-02 ENCOUNTER — Ambulatory Visit (INDEPENDENT_AMBULATORY_CARE_PROVIDER_SITE_OTHER): Payer: Medicare HMO | Admitting: Physician Assistant

## 2018-05-02 ENCOUNTER — Ambulatory Visit: Payer: Medicare HMO | Admitting: Physician Assistant

## 2018-05-02 VITALS — BP 102/50 | HR 66 | Temp 97.9°F | Resp 14 | Ht 62.0 in | Wt 105.0 lb

## 2018-05-02 DIAGNOSIS — E785 Hyperlipidemia, unspecified: Secondary | ICD-10-CM

## 2018-05-02 DIAGNOSIS — Z780 Asymptomatic menopausal state: Secondary | ICD-10-CM | POA: Diagnosis not present

## 2018-05-02 DIAGNOSIS — B9789 Other viral agents as the cause of diseases classified elsewhere: Secondary | ICD-10-CM

## 2018-05-02 DIAGNOSIS — M542 Cervicalgia: Secondary | ICD-10-CM

## 2018-05-02 DIAGNOSIS — I10 Essential (primary) hypertension: Secondary | ICD-10-CM

## 2018-05-02 DIAGNOSIS — G8929 Other chronic pain: Secondary | ICD-10-CM

## 2018-05-02 DIAGNOSIS — J329 Chronic sinusitis, unspecified: Secondary | ICD-10-CM | POA: Diagnosis not present

## 2018-05-02 LAB — CBC
HCT: 35.4 % — ABNORMAL LOW (ref 36.0–46.0)
Hemoglobin: 11.7 g/dL — ABNORMAL LOW (ref 12.0–15.0)
MCHC: 33 g/dL (ref 30.0–36.0)
MCV: 88.1 fl (ref 78.0–100.0)
PLATELETS: 167 10*3/uL (ref 150.0–400.0)
RBC: 4.02 Mil/uL (ref 3.87–5.11)
RDW: 14.5 % (ref 11.5–15.5)
WBC: 4.2 10*3/uL (ref 4.0–10.5)

## 2018-05-02 LAB — COMPREHENSIVE METABOLIC PANEL
ALBUMIN: 3.9 g/dL (ref 3.5–5.2)
ALT: 19 U/L (ref 0–35)
AST: 30 U/L (ref 0–37)
Alkaline Phosphatase: 47 U/L (ref 39–117)
BUN: 25 mg/dL — AB (ref 6–23)
CO2: 29 mEq/L (ref 19–32)
Calcium: 9.1 mg/dL (ref 8.4–10.5)
Chloride: 102 mEq/L (ref 96–112)
Creatinine, Ser: 0.88 mg/dL (ref 0.40–1.20)
GFR: 61.49 mL/min (ref >60.00)
GLUCOSE: 113 mg/dL — AB (ref 70–99)
POTASSIUM: 3.8 meq/L (ref 3.5–5.1)
SODIUM: 138 meq/L (ref 135–145)
Total Bilirubin: 0.3 mg/dL (ref 0.2–1.2)
Total Protein: 6.5 g/dL (ref 6.0–8.3)

## 2018-05-02 LAB — LIPID PANEL
Cholesterol: 119 mg/dL (ref 0–200)
HDL: 56.7 mg/dL (ref >39.00)
LDL Cholesterol: 49 mg/dL (ref 0–99)
NonHDL: 62.76
Total CHOL/HDL Ratio: 2
Triglycerides: 70 mg/dL (ref 0.0–149.0)
VLDL: 14 mg/dL (ref 0.0–40.0)

## 2018-05-02 MED ORDER — AZELASTINE HCL 0.1 % NA SOLN: 1.0000 | Freq: Two times a day (BID) | NASAL | 1 refills | Status: DC

## 2018-05-02 NOTE — Progress Notes (Deleted)
Established Patient Office Visit  Subjective:  Patient ID: Bethany Carpenter, female    DOB: Jul 06, 1933  Age: 85 y.o. MRN: [id number]  CC:  Chief Complaint  Patient presents with  . Pain    Neck/Back pain-Refill Tramadol    HPI [name] presents for ***  Cholesterol:  Simvastatin - denies muscle aches, constipation, Headaches  HTN; Denies lightheadedness on usual basis, SOB, swelling.   Back pain:  URI: Started Friday night with slight improvement. body aches, nasal congestion with yellow nasal discharge, no cough, frontal sinus pressure, ear pressure, fever and chills,lightheadedness and decreased appetite.    Denies: vomiting, diarrhea, constipation Nasal rinse is helping.  Pt advised to try pedialyte and gatorade with plenty of water as appetite has been diminished.    Past Medical History:  Diagnosis Date  . Allergy   . Arthritis   . Cancer (HCC)    Skin  . Colon polyps   . Heart disease   . History of chickenpox   . Hyperlipidemia   . Hypertension     Past Surgical History:  Procedure Laterality Date  . ANGIOPLASTY  03/08/2007  . BREAST BIOPSY  09/28/2003  . CATARACT EXTRACTION  11/25/2005   04/07/2006  . CERVICAL SPINE SURGERY  11/24/2012   02/02/2008  . TONSILLECTOMY  1952  . TUBAL LIGATION  1970    Family History  Problem Relation Age of Onset  . Hearing loss Mother   . Hypertension Mother   . Osteoporosis Mother   . Arthritis Mother   . Cancer Father        Skin  . Hearing loss Father   . Arthritis Father   . Diabetes Brother   . Stroke Brother   . Early death Brother   . Depression Daughter   . Early death Son   . Early death Maternal Grandfather   . Early death Paternal Grandfather     Social History   Socioeconomic History  . Marital status: Divorced    Spouse name: Not on file  . Number of children: Not on file  . Years of education: Not on file  . Highest education level: Not on file  Occupational History  . Occupation:  Retired  Scientific laboratory technician  . Financial resource strain: Not on file  . Food insecurity:    Worry: Not on file    Inability: Not on file  . Transportation needs:    Medical: Not on file    Non-medical: Not on file  Tobacco Use  . Smoking status: Never Smoker  . Smokeless tobacco: Never Used  Substance and Sexual Activity  . Alcohol use: No    Frequency: Never  . Drug use: No  . Sexual activity: Not Currently  Lifestyle  . Physical activity:    Days per week: Not on file    Minutes per session: Not on file  . Stress: Not on file  Relationships  . Social connections:    Talks on phone: Not on file    Gets together: Not on file    Attends religious service: Not on file    Active member of club or organization: Not on file    Attends meetings of clubs or organizations: Not on file    Relationship status: Not on file  . Intimate partner violence:    Fear of current or ex partner: Not on file    Emotionally abused: Not on file    Physically abused: Not on file    Forced  sexual activity: Not on file  Other Topics Concern  . Not on file  Social History Narrative  . Not on file    Outpatient Medications Prior to Visit  Medication Sig Dispense Refill  . aspirin 81 MG EC tablet aspirin 81 mg tablet,delayed release  Take 1 tablet every day by oral route.    . carvedilol (COREG) 12.5 MG tablet Take 1 tablet (12.5 mg total) by mouth 2 (two) times daily with a meal. 180 tablet 1  . fluticasone (FLONASE) 50 MCG/ACT nasal spray Place 1 spray into both nostrils daily.    Marland Kitchen ketotifen (ZADITOR) 0.025 % ophthalmic solution Place 1 drop into both eyes 2 (two) times daily.    Marland Kitchen lisinopril (PRINIVIL,ZESTRIL) 10 MG tablet Take 1 tablet (10 mg total) by mouth 2 (two) times daily. 180 tablet 1  . prednisoLONE acetate (PREDNISOLONE ACETATE P-F) 1 % ophthalmic suspension Place 1 drop into the right eye 4 (four) times daily.    . simvastatin (ZOCOR) 40 MG tablet Take 1 tablet (40 mg total) by mouth  daily. 90 tablet 1  . traMADol (ULTRAM) 50 MG tablet Take 1 tablet (50 mg total) by mouth every 6 (six) hours as needed. 120 tablet 0   No facility-administered medications prior to visit.     Allergies  Allergen Reactions  . Nickel   . Penicillins Rash  . Sulfa Antibiotics Rash    ROS Review of Systems    Objective:    Physical Exam  HENT:  Mouth/Throat: Posterior oropharyngeal erythema present.  Neck:    Cardiovascular: Normal rate, regular rhythm and normal heart sounds.  Pulmonary/Chest: Effort normal and breath sounds normal.    BP (!) 102/50   Pulse 66   Temp 97.9 F (36.6 C) (Oral)   Resp 14   Ht 5\' 2"  (1.575 m)   Wt 47.6 kg   SpO2 96%   BMI 19.20 kg/m  Wt Readings from Last 3 Encounters:  05/02/18 47.6 kg  02/21/18 49.9 kg  01/03/18 49.9 kg     Health Maintenance Due  Topic Date Due  . DEXA SCAN  03/03/2001    There are no preventive care reminders to display for this patient.  No results found for: TSH Lab Results  Component Value Date   WBC 3.9 03/24/2017   HGB 12.1 03/24/2017   HCT 37 03/24/2017   PLT 255 03/24/2017   Lab Results  Component Value Date   NA 140 03/24/2017   K 4.6 03/24/2017   BUN 25 (A) 03/24/2017   CREATININE 0.8 03/24/2017   ALKPHOS 58 03/24/2017   AST 38 (A) 03/24/2017   ALT 27 03/24/2017   Lab Results  Component Value Date   CHOL 134 03/24/2017   Lab Results  Component Value Date   HDL 59 03/24/2017   Lab Results  Component Value Date   LDLCALC 66 03/24/2017   Lab Results  Component Value Date   TRIG 58 03/24/2017   No results found for: CHOLHDL No results found for: HGBA1C    Assessment & Plan:   Problem List Items Addressed This Visit    None      No orders of the defined types were placed in this encounter.   Follow-up: No follow-ups on file.    Marqus Macphee E Evalene Vath, Student-PA

## 2018-05-02 NOTE — Patient Instructions (Signed)
Please go to the lab today for blood work.  I will call you with your results. We will alter treatment regimen(s) if indicated by your results.   I want you to hold your lisinopril today and tomorrow while you work on hydrating yourself and working on food intake. Then you can restart.   I am sending in a nasal spray called Astelin to use short-term with your Flonase. Keep with with the saline nasal rinses.  Symptoms should ease up over the next few days. If not please call me.

## 2018-05-02 NOTE — Progress Notes (Signed)
Patient presents to clinic today for follow-up of hypertension, hyperlipidemia and chronic cervicalgia. Patient also with acute concerns.   Patient is currently on a regimen of lisinopril, carvedilol, simvastatin and tramadol. Endorses taking all medications as directed. Denies noted side effect of medication. Patient denies chest pain, palpitations, lightheadedness, dizziness, vision changes or frequent headaches.   BP Readings from Last 3 Encounters:  05/02/18 (!) 102/50  02/21/18 134/66  01/03/18 132/70   Patient endorses URI symptoms starting Friday night (2.5 days ago). Notes nasal congestion, yellow nasal drainage, frontal sinus pressure, ear pressure, chills. Notes some mild lightheadedness with symptoms. Has been trying to keep hydrated. Is uring a nasal rinse. Feels symptoms are improved from onset. Has not been hydrating well.  Past Medical History:  Diagnosis Date  . Allergy   . Arthritis   . Cancer (HCC)    Skin  . Colon polyps   . Heart disease   . History of chickenpox   . Hyperlipidemia   . Hypertension     Current Outpatient Medications on File Prior to Visit  Medication Sig Dispense Refill  . aspirin 81 MG EC tablet aspirin 81 mg tablet,delayed release  Take 1 tablet every day by oral route.    . carvedilol (COREG) 12.5 MG tablet Take 1 tablet (12.5 mg total) by mouth 2 (two) times daily with a meal. 180 tablet 1  . fluticasone (FLONASE) 50 MCG/ACT nasal spray Place 1 spray into both nostrils daily.    Marland Kitchen ketotifen (ZADITOR) 0.025 % ophthalmic solution Place 1 drop into both eyes 2 (two) times daily.    Marland Kitchen lisinopril (PRINIVIL,ZESTRIL) 10 MG tablet Take 1 tablet (10 mg total) by mouth 2 (two) times daily. 180 tablet 1  . prednisoLONE acetate (PREDNISOLONE ACETATE P-F) 1 % ophthalmic suspension Place 1 drop into the right eye 4 (four) times daily.    . simvastatin (ZOCOR) 40 MG tablet Take 1 tablet (40 mg total) by mouth daily. 90 tablet 1  . traMADol (ULTRAM) 50  MG tablet Take 1 tablet (50 mg total) by mouth every 6 (six) hours as needed. 120 tablet 0   No current facility-administered medications on file prior to visit.     Allergies  Allergen Reactions  . Nickel   . Penicillins Rash  . Sulfa Antibiotics Rash    Family History  Problem Relation Age of Onset  . Hearing loss Mother   . Hypertension Mother   . Osteoporosis Mother   . Arthritis Mother   . Cancer Father        Skin  . Hearing loss Father   . Arthritis Father   . Diabetes Brother   . Stroke Brother   . Early death Brother   . Depression Daughter   . Early death Son   . Early death Maternal Grandfather   . Early death Paternal Grandfather     Social History   Socioeconomic History  . Marital status: Divorced    Spouse name: Not on file  . Number of children: Not on file  . Years of education: Not on file  . Highest education level: Not on file  Occupational History  . Occupation: Retired  Scientific laboratory technician  . Financial resource strain: Not on file  . Food insecurity:    Worry: Not on file    Inability: Not on file  . Transportation needs:    Medical: Not on file    Non-medical: Not on file  Tobacco Use  . Smoking status:  Never Smoker  . Smokeless tobacco: Never Used  Substance and Sexual Activity  . Alcohol use: No    Frequency: Never  . Drug use: No  . Sexual activity: Not Currently  Lifestyle  . Physical activity:    Days per week: Not on file    Minutes per session: Not on file  . Stress: Not on file  Relationships  . Social connections:    Talks on phone: Not on file    Gets together: Not on file    Attends religious service: Not on file    Active member of club or organization: Not on file    Attends meetings of clubs or organizations: Not on file    Relationship status: Not on file  Other Topics Concern  . Not on file  Social History Narrative  . Not on file   Review of Systems - See HPI.  All other ROS are negative.  BP (!) 102/50    Pulse 66   Temp 97.9 F (36.6 C) (Oral)   Resp 14   Ht _0  (1.575 m)   Wt 105 lb (47.6 kg)   SpO2 96%   BMI 19.20 kg/m   Physical Exam Vitals signs reviewed.  Constitutional:      Appearance: Normal appearance.  HENT:     Head: Normocephalic and atraumatic.     Right Ear: Tympanic membrane normal.     Left Ear: Tympanic membrane normal.     Nose: Congestion present.     Mouth/Throat:     Mouth: Mucous membranes are moist.  Eyes:     Pupils: Pupils are equal, round, and reactive to light.  Neck:     Musculoskeletal: Neck supple.  Cardiovascular:     Rate and Rhythm: Normal rate and regular rhythm.     Pulses: Normal pulses.     Heart sounds: Normal heart sounds.  Pulmonary:     Effort: Pulmonary effort is normal.     Breath sounds: Normal breath sounds.  Neurological:     General: No focal deficit present.     Mental Status: She is alert and oriented to person, place, and time.  Psychiatric:        Mood and Affect: Mood normal.    Assessment/Plan: 1. Essential hypertension BP slightly low today likely due to lack of intake the past few days. Hold lisinopril today and tomorrow. Push fluids and work on regular diet. Once improved at home, restart lisinopril. Will check labs today. - Comp Met (CMET) - CBC - Lipid Profile  2. Hyperlipidemia, unspecified hyperlipidemia type - CBC - Lipid Profile  3. Chronic neck pain Doing well. Continue current regimen. Repeat LFT today. - Comp Met (CMET)  4. Postmenopausal estrogen deficiency DEXA reordered. - DG Bone Density; Future  5. Viral sinusitis Improving. 2.5 days of symptoms. Increase fluids. Rest. Continue Flonase and start Astelin. Supportive measures reviewed. Strict return precautions reviewed with patient.    Leeanne Rio, PA-C

## 2018-05-06 ENCOUNTER — Ambulatory Visit: Payer: Medicare HMO | Admitting: Physician Assistant

## 2018-05-09 ENCOUNTER — Telehealth: Payer: Self-pay

## 2018-05-09 NOTE — Telephone Encounter (Signed)
Pt called in stating that in her last appt you

## 2018-05-10 ENCOUNTER — Other Ambulatory Visit: Payer: Self-pay | Admitting: Physician Assistant

## 2018-05-10 DIAGNOSIS — R7989 Other specified abnormal findings of blood chemistry: Secondary | ICD-10-CM

## 2018-05-15 ENCOUNTER — Other Ambulatory Visit: Payer: Self-pay | Admitting: Physician Assistant

## 2018-05-16 ENCOUNTER — Encounter: Payer: Self-pay | Admitting: Physician Assistant

## 2018-05-16 MED ORDER — TRAMADOL HCL 50 MG PO TABS: 50.0000 mg | ORAL_TABLET | Freq: Four times a day (QID) | ORAL | 0 refills | Status: DC | PRN

## 2018-05-16 NOTE — Telephone Encounter (Signed)
Last refill:04/13/18 #120, 0 Last OV:05/02/18

## 2018-05-25 ENCOUNTER — Other Ambulatory Visit (INDEPENDENT_AMBULATORY_CARE_PROVIDER_SITE_OTHER): Payer: Medicare HMO

## 2018-05-25 DIAGNOSIS — R7989 Other specified abnormal findings of blood chemistry: Secondary | ICD-10-CM

## 2018-05-25 LAB — CBC WITH DIFFERENTIAL/PLATELET
BASOS ABS: 0 10*3/uL (ref 0.0–0.1)
Basophils Relative: 0.7 % (ref 0.0–3.0)
Eosinophils Absolute: 0.1 10*3/uL (ref 0.0–0.7)
Eosinophils Relative: 3.2 % (ref 0.0–5.0)
HCT: 35.1 % — ABNORMAL LOW (ref 36.0–46.0)
Hemoglobin: 11.6 g/dL — ABNORMAL LOW (ref 12.0–15.0)
Lymphocytes Relative: 30.4 % (ref 12.0–46.0)
Lymphs Abs: 1.3 10*3/uL (ref 0.7–4.0)
MCHC: 33 g/dL (ref 30.0–36.0)
MCV: 88 fl (ref 78.0–100.0)
Monocytes Absolute: 0.5 10*3/uL (ref 0.1–1.0)
Monocytes Relative: 11.7 % (ref 3.0–12.0)
Neutro Abs: 2.4 10*3/uL (ref 1.4–7.7)
Neutrophils Relative %: 54 % (ref 43.0–77.0)
PLATELETS: 206 10*3/uL (ref 150.0–400.0)
RBC: 3.98 Mil/uL (ref 3.87–5.11)
RDW: 14.4 % (ref 11.5–15.5)
WBC: 4.4 10*3/uL (ref 4.0–10.5)

## 2018-05-26 ENCOUNTER — Other Ambulatory Visit: Payer: Self-pay | Admitting: Physician Assistant

## 2018-05-26 DIAGNOSIS — D649 Anemia, unspecified: Secondary | ICD-10-CM

## 2018-06-15 ENCOUNTER — Encounter: Payer: Self-pay | Admitting: Physician Assistant

## 2018-06-15 DIAGNOSIS — M542 Cervicalgia: Secondary | ICD-10-CM

## 2018-06-15 MED ORDER — TRAMADOL HCL 50 MG PO TABS: 50.0000 mg | ORAL_TABLET | Freq: Four times a day (QID) | ORAL | 0 refills | Status: DC | PRN

## 2018-06-15 NOTE — Telephone Encounter (Signed)
Last rx for Tramadol on 05/16/18 #120 Last OV 05/02/18

## 2018-06-17 ENCOUNTER — Encounter: Payer: Self-pay | Admitting: Physician Assistant

## 2018-07-18 ENCOUNTER — Encounter: Payer: Self-pay | Admitting: Physician Assistant

## 2018-07-18 DIAGNOSIS — M542 Cervicalgia: Secondary | ICD-10-CM

## 2018-07-18 MED ORDER — TRAMADOL HCL 50 MG PO TABS: 50.0000 mg | ORAL_TABLET | Freq: Four times a day (QID) | ORAL | 0 refills | Status: DC | PRN

## 2018-07-18 NOTE — Telephone Encounter (Signed)
Indication for chronic opioid: Chronic Neck Pain Medication and dose: Tramadol 50 mg # pills per month: 120 on 06/15/18 Last UDS date: No Opioid Treatment Agreement signed (Y/N): No Opioid Treatment Agreement last reviewed with patient:   N[facility] reviewed this encounter (include red flags):     Please advise

## 2018-08-05 ENCOUNTER — Encounter: Payer: Self-pay | Admitting: Physician Assistant

## 2018-08-09 ENCOUNTER — Encounter: Payer: Self-pay | Admitting: Physician Assistant

## 2018-08-09 ENCOUNTER — Other Ambulatory Visit: Payer: Self-pay | Admitting: Emergency Medicine

## 2018-08-09 DIAGNOSIS — M542 Cervicalgia: Secondary | ICD-10-CM

## 2018-08-09 DIAGNOSIS — I10 Essential (primary) hypertension: Secondary | ICD-10-CM

## 2018-08-09 MED ORDER — TRAMADOL HCL 50 MG PO TABS: 50.0000 mg | ORAL_TABLET | Freq: Four times a day (QID) | ORAL | 0 refills | Status: DC | PRN

## 2018-08-09 MED ORDER — CARVEDILOL 12.5 MG PO TABS: 12.5000 mg | ORAL_TABLET | Freq: Two times a day (BID) | ORAL | 1 refills | Status: DC

## 2018-08-09 MED ORDER — LISINOPRIL 10 MG PO TABS: 10.0000 mg | ORAL_TABLET | Freq: Two times a day (BID) | ORAL | 1 refills | Status: DC

## 2018-08-09 NOTE — Telephone Encounter (Signed)
Have refilled the Carvedilol and Lisinopril to the [facility].  Patient does want rx for Tramadol to go to the mail order pharmacy.  Tramadol last rx 07/18/18 #120

## 2018-08-16 ENCOUNTER — Encounter: Payer: Self-pay | Admitting: Physician Assistant

## 2018-08-16 ENCOUNTER — Ambulatory Visit (INDEPENDENT_AMBULATORY_CARE_PROVIDER_SITE_OTHER): Payer: Medicare HMO | Admitting: Physician Assistant

## 2018-08-16 ENCOUNTER — Other Ambulatory Visit: Payer: Self-pay

## 2018-08-16 ENCOUNTER — Encounter: Payer: Self-pay | Admitting: Emergency Medicine

## 2018-08-16 DIAGNOSIS — G8929 Other chronic pain: Secondary | ICD-10-CM

## 2018-08-16 DIAGNOSIS — M542 Cervicalgia: Secondary | ICD-10-CM | POA: Diagnosis not present

## 2018-08-16 NOTE — Progress Notes (Signed)
Virtual Visit via Telephone Note  I connected with Bethany Carpenter on 08/16/18 at  9:20 AM EDT by telephone and verified that I am speaking with the correct person using two identifiers.  Location: Patient: Home Provider: [facility]   I discussed the limitations, risks, security and privacy concerns of performing an evaluation and management service by telephone and the availability of in person appointments. I also discussed with the patient that there may be a patient responsible charge related to this service. The patient expressed understanding and agreed to proceed.   History of Present Illness: Patient presents today via phone to follow-up for chronic cervicalgia 2/2 DDD. Is currently on a regimen of Tramadol 50 mg Q6H. Has been on this regimen for sometime. Still taking as directed and tolerating well overall. Notes if she is consistent with medication her pain level is very well-controlled. If she forgets to take the medicine she notes a huge increase in the dull, aching pain. Notes without medication pain averages 6-7/10, sometimes a 9/10 if she has done a lot around the house. With her medication notes pain down to a 1-2/10.    Observations/Objective: Patient is in no acute distress.  Resting comfortably at home.  No labored breathing.  Speech is clear and coherent with logical content.  Patient is alert and oriented at baseline.   Assessment and Plan: 1. Chronic neck pain Doing very well. Continue current regimen. PDMP reviewed today -- no red flags. Will renew [place] next visit in office. Meds just filled so not due for 3.5 weeks.    Follow Up Instructions: Follow-up 3 months for reassessment.    I discussed the assessment and treatment plan with the patient. The patient was provided an opportunity to ask questions and all were answered. The patient agreed with the plan and demonstrated an understanding of the instructions.   The patient was advised to call back  or seek an in-person evaluation if the symptoms worsen or if the condition fails to improve as anticipated.  I provided 10 minutes of non-face-to-face time during this encounter.   Bethany Rio, PA-C

## 2018-08-16 NOTE — Progress Notes (Signed)
I have discussed the procedure for the virtual visit with the patient who has given consent to proceed with assessment and treatment.   Shelitha Magley S Lillias Difrancesco, CMA     

## 2018-09-13 ENCOUNTER — Encounter: Payer: Self-pay | Admitting: Physician Assistant

## 2018-09-13 DIAGNOSIS — M542 Cervicalgia: Secondary | ICD-10-CM

## 2018-09-13 MED ORDER — TRAMADOL HCL 50 MG PO TABS: 50.0000 mg | ORAL_TABLET | Freq: Four times a day (QID) | ORAL | 0 refills | Status: DC | PRN

## 2018-09-13 NOTE — Telephone Encounter (Signed)
Tramadol last rx 08/09/18 #120 LOV: 08/16/18 Chronic neck pain

## 2018-10-13 ENCOUNTER — Encounter: Payer: Self-pay | Admitting: Physician Assistant

## 2018-10-13 DIAGNOSIS — M542 Cervicalgia: Secondary | ICD-10-CM

## 2018-10-13 MED ORDER — TRAMADOL HCL 50 MG PO TABS: 50.0000 mg | ORAL_TABLET | Freq: Four times a day (QID) | ORAL | 0 refills | Status: DC | PRN

## 2018-11-16 ENCOUNTER — Encounter: Payer: Self-pay | Admitting: Physician Assistant

## 2018-11-16 DIAGNOSIS — M542 Cervicalgia: Secondary | ICD-10-CM

## 2018-11-18 MED ORDER — TRAMADOL HCL 50 MG PO TABS: 50.0000 mg | ORAL_TABLET | Freq: Four times a day (QID) | ORAL | 0 refills | Status: DC | PRN

## 2018-11-23 ENCOUNTER — Encounter: Payer: Self-pay | Admitting: Physician Assistant

## 2018-11-23 ENCOUNTER — Ambulatory Visit (INDEPENDENT_AMBULATORY_CARE_PROVIDER_SITE_OTHER): Payer: Medicare HMO | Admitting: Physician Assistant

## 2018-11-23 ENCOUNTER — Other Ambulatory Visit: Payer: Self-pay

## 2018-11-23 DIAGNOSIS — M542 Cervicalgia: Secondary | ICD-10-CM | POA: Diagnosis not present

## 2018-11-23 DIAGNOSIS — G8929 Other chronic pain: Secondary | ICD-10-CM

## 2018-11-23 NOTE — Progress Notes (Signed)
Virtual Visit via Video   I connected with patient on 11/23/18 at 10:00 AM EDT by a video enabled telemedicine application and verified that I am speaking with the correct person using two identifiers.  Location patient: Home Location provider: [facility], Office Persons participating in the virtual visit: Patient, Provider, [place] (Patina Moore)  I discussed the limitations of evaluation and management by telemedicine and the availability of in person appointments. The patient expressed understanding and agreed to proceed.  Subjective:   HPI:   Reviewed and updated today.  Indication for chronic opioid: Chronic Cervicalgia, OA of cervical spine Medication and dose: Tramadol 50 mg  # pills per month: 120 Last UDS date: Due. Will obtain at next in-office visit.  Pain contract signed (date): 11/03/2017. Needs to be renewed.  Date narcotic database last reviewed (include red flags): 11/23/2018. No red flags.   Pain Inventory (1-10 worse): Average Pain 8/10 before meds, down to 1-2/10 with medication Pain Right Now 3/10 My pain is aching and constant (character i.e. sharp, stabbing, dull, constant etc)  Pain is worse with: exertion, weather Relief from Meds: great  In the last 24 hours, has pain interfered with the following (1-10 greatest interference) ? General activity 2 Relation with others 1 Enjoyment of life 1 What TIME of day is your pain at its worst? Evening or after yardwork       Sleep (in general) stable  Mobility/Function: Assistance device: None How many minutes can you walk? 15-20 Ability to climb steps?  Y Do you drive? Y Disabled (date): N/A  Neuro/Psych Sx: (bladder, bowel, weakness, dizziness, depression etc) None at present. Prior Studies: See EMR Physicians involved in your care: Any changes since last visit?  None  ROS:   See pertinent positives and negatives per HPI.  Patient Active Problem List   Diagnosis Date Noted  . Essential  hypertension 05/12/2017  . Hyperlipidemia 05/12/2017  . Chronic neck pain 05/12/2017  . Chronic low back pain without sciatica 05/12/2017  . Seasonal allergic rhinitis 05/12/2017  . Skin cancer 09/16/2016    Social History   Tobacco Use  . Smoking status: Never Smoker  . Smokeless tobacco: Never Used  Substance Use Topics  . Alcohol use: No    Frequency: Never    Current Outpatient Medications:  .  aspirin 81 MG EC tablet, aspirin 81 mg tablet,delayed release  Take 1 tablet every day by oral route., Disp: , Rfl:  .  carvedilol (COREG) 12.5 MG tablet, Take 1 tablet (12.5 mg total) by mouth 2 (two) times daily with a meal., Disp: 180 tablet, Rfl: 1 .  fluticasone (FLONASE) 50 MCG/ACT nasal spray, Place 1 spray into both nostrils daily., Disp: , Rfl:  .  lisinopril (ZESTRIL) 10 MG tablet, Take 1 tablet (10 mg total) by mouth 2 (two) times daily., Disp: 180 tablet, Rfl: 1 .  prednisoLONE acetate (PREDNISOLONE ACETATE P-F) 1 % ophthalmic suspension, Place 1 drop into the right eye 4 (four) times daily., Disp: , Rfl:  .  simvastatin (ZOCOR) 40 MG tablet, Take 1 tablet (40 mg total) by mouth daily., Disp: 90 tablet, Rfl: 1 .  traMADol (ULTRAM) 50 MG tablet, Take 1 tablet (50 mg total) by mouth every 6 (six) hours as needed., Disp: 120 tablet, Rfl: 0  Allergies  Allergen Reactions  . Nickel   . Penicillins Rash  . Sulfa Antibiotics Rash    Objective:   There were no vitals taken for this visit.  Patient is well-developed,  well-nourished in no acute distress.  Resting comfortably at home.  Head is normocephalic, atraumatic.  No labored breathing.  Speech is clear and coherent with logical content.  Patient is alert and oriented at baseline.   Assessment and Plan:   1. Chronic neck pain 2. Encounter for chronic pain management Stable. New CSC sent to MyChart for completion. She is going to scan this back to [place]. PDMP reviewed today. No red flags. Medications refilled. Follow-up  in office in 3 months. Will update UDS at that time.     Leeanne Rio, PA-C 11/23/2018

## 2018-11-23 NOTE — Progress Notes (Signed)
I have discussed the procedure for the virtual visit with the patient who has given consent to proceed with assessment and treatment.   Makenah Karas S Brek Reece, CMA     

## 2018-12-06 ENCOUNTER — Ambulatory Visit (INDEPENDENT_AMBULATORY_CARE_PROVIDER_SITE_OTHER): Payer: Medicare HMO

## 2018-12-06 ENCOUNTER — Other Ambulatory Visit: Payer: Self-pay

## 2018-12-06 DIAGNOSIS — Z23 Encounter for immunization: Secondary | ICD-10-CM

## 2018-12-18 DIAGNOSIS — Z1159 Encounter for screening for other viral diseases: Secondary | ICD-10-CM | POA: Diagnosis not present

## 2018-12-22 ENCOUNTER — Telehealth: Payer: Self-pay | Admitting: Physician Assistant

## 2018-12-22 ENCOUNTER — Other Ambulatory Visit: Payer: Self-pay

## 2018-12-22 DIAGNOSIS — M542 Cervicalgia: Secondary | ICD-10-CM

## 2018-12-22 MED ORDER — TRAMADOL HCL 50 MG PO TABS: 50.0000 mg | ORAL_TABLET | Freq: Four times a day (QID) | ORAL | 0 refills | Status: DC | PRN

## 2018-12-22 NOTE — Telephone Encounter (Signed)
Refill request has been sent to Dr. Birdie Riddle for approval

## 2018-12-22 NOTE — Telephone Encounter (Signed)
Pt called in asking for a refill on the tramadol to be sent into the [facility]. Pt can be reached at the home # she states that she will be out of this medication tomorrow.

## 2018-12-22 NOTE — Telephone Encounter (Signed)
Last refill: 11/18/18 #120, 0 Last OV: 8.19.20 dx. Chronic neck pain

## 2018-12-22 NOTE — Telephone Encounter (Signed)
Medication filled to pharmacy as requested.   

## 2018-12-24 ENCOUNTER — Other Ambulatory Visit: Payer: Self-pay | Admitting: Physician Assistant

## 2018-12-24 DIAGNOSIS — E785 Hyperlipidemia, unspecified: Secondary | ICD-10-CM

## 2019-01-10 ENCOUNTER — Telehealth: Payer: Self-pay

## 2019-01-10 ENCOUNTER — Ambulatory Visit (INDEPENDENT_AMBULATORY_CARE_PROVIDER_SITE_OTHER): Payer: Medicare HMO | Admitting: Physician Assistant

## 2019-01-10 ENCOUNTER — Encounter: Payer: Self-pay | Admitting: Physician Assistant

## 2019-01-10 ENCOUNTER — Other Ambulatory Visit: Payer: Self-pay

## 2019-01-10 VITALS — BP 130/60 | HR 29 | Temp 96.3°F | Resp 14 | Ht 62.0 in | Wt 111.0 lb

## 2019-01-10 DIAGNOSIS — R001 Bradycardia, unspecified: Secondary | ICD-10-CM | POA: Diagnosis not present

## 2019-01-10 LAB — TSH: TSH: 2.36 u[IU]/mL (ref 0.35–4.50)

## 2019-01-10 LAB — CBC
HCT: 35.4 % — ABNORMAL LOW (ref 36.0–46.0)
Hemoglobin: 11.6 g/dL — ABNORMAL LOW (ref 12.0–15.0)
MCHC: 32.7 g/dL (ref 30.0–36.0)
MCV: 89.3 fl (ref 78.0–100.0)
Platelets: 176 10*3/uL (ref 150.0–400.0)
RBC: 3.97 Mil/uL (ref 3.87–5.11)
RDW: 14.5 % (ref 11.5–15.5)
WBC: 6.1 10*3/uL (ref 4.0–10.5)

## 2019-01-10 LAB — BASIC METABOLIC PANEL
BUN: 42 mg/dL — ABNORMAL HIGH (ref 6–23)
CO2: 26 mEq/L (ref 19–32)
Calcium: 9.6 mg/dL (ref 8.4–10.5)
Chloride: 102 mEq/L (ref 96–112)
Creatinine, Ser: 1.29 mg/dL — ABNORMAL HIGH (ref 0.40–1.20)
GFR: 39.48 mL/min — ABNORMAL LOW (ref >60.00)
Glucose, Bld: 108 mg/dL — ABNORMAL HIGH (ref 70–99)
Potassium: 4.7 mEq/L (ref 3.5–5.1)
Sodium: 135 mEq/L (ref 135–145)

## 2019-01-10 NOTE — Patient Instructions (Addendum)
Please go to the lab today for blood work.  I will call you with your results. We will alter treatment regimen(s) if indicated by your results.   Stop Carvedilol immediately.  I want you to follow-up with me on Thursday morning for repeat assessment. If still low at that time we will need to have you see Cardiology..  If you note any chest pain, shortness of breath or severe lightheadedness, please call 911.  Bethany Carpenter

## 2019-01-10 NOTE — Telephone Encounter (Signed)
° ° °  Call from [place] at [place], requesting same day appointment for patient with low HR. Waiting for response from DOD. Message left vcml to ask for DOD nurse at [place]. Please transfer to DOD -NL  STAT if HR is under 50 or over 120 (normal HR is 60-100 beats per minute)  1) What is your heart rate? 20-->30  2) Do you have a log of your heart rate readings (document readings)?   3) Do you have any other symptoms?

## 2019-01-10 NOTE — Progress Notes (Signed)
Patient presents to clinic today c/o fatigue and neck ache x 5 days. Has history of cervicalgia but notes this is a muscular aching. Not noting elsewhere. Denies any trauma or injury. Denies recent heavy lifting. No radiation of pain into arms or thoracic back. Notes some lightheadedness with exertion. Noting some decreased appetite as well. Tramadol helps some. Denies fever, chills. Denies recent travel or sick contact. Denies known exposure to COVID.  Past Medical History:  Diagnosis Date  . Allergy   . Arthritis   . Cancer (HCC)    Skin  . Colon polyps   . Heart disease   . History of chickenpox   . Hyperlipidemia   . Hypertension     Current Outpatient Medications on File Prior to Visit  Medication Sig Dispense Refill  . aspirin 81 MG EC tablet aspirin 81 mg tablet,delayed release  Take 1 tablet every day by oral route.    . carvedilol (COREG) 12.5 MG tablet Take 1 tablet (12.5 mg total) by mouth 2 (two) times daily with a meal. 180 tablet 1  . fluticasone (FLONASE) 50 MCG/ACT nasal spray Place 1 spray into both nostrils daily.    Marland Kitchen lisinopril (ZESTRIL) 10 MG tablet Take 1 tablet (10 mg total) by mouth 2 (two) times daily. 180 tablet 1  . prednisoLONE acetate (PREDNISOLONE ACETATE P-F) 1 % ophthalmic suspension Place 1 drop into the right eye 4 (four) times daily.    . simvastatin (ZOCOR) 40 MG tablet TAKE 1 TABLET DAILY 90 tablet 1  . traMADol (ULTRAM) 50 MG tablet Take 1 tablet (50 mg total) by mouth every 6 (six) hours as needed. 120 tablet 0   No current facility-administered medications on file prior to visit.     Allergies  Allergen Reactions  . Nickel   . Penicillins Rash  . Sulfa Antibiotics Rash    Family History  Problem Relation Age of Onset  . Hearing loss Mother   . Hypertension Mother   . Osteoporosis Mother   . Arthritis Mother   . Cancer Father        Skin  . Hearing loss Father   . Arthritis Father   . Diabetes Brother   . Stroke Brother   .  Early death Brother   . Depression Daughter   . Early death Son   . Early death Maternal Grandfather   . Early death Paternal Grandfather     Social History   Socioeconomic History  . Marital status: Divorced    Spouse name: Not on file  . Number of children: Not on file  . Years of education: Not on file  . Highest education level: Not on file  Occupational History  . Occupation: Retired  Scientific laboratory technician  . Financial resource strain: Not on file  . Food insecurity    Worry: Not on file    Inability: Not on file  . Transportation needs    Medical: Not on file    Non-medical: Not on file  Tobacco Use  . Smoking status: Never Smoker  . Smokeless tobacco: Never Used  Substance and Sexual Activity  . Alcohol use: No    Frequency: Never  . Drug use: No  . Sexual activity: Not Currently  Lifestyle  . Physical activity    Days per week: Not on file    Minutes per session: Not on file  . Stress: Not on file  Relationships  . Social connections    Talks on phone: Not on  file    Gets together: Not on file    Attends religious service: Not on file    Active member of club or organization: Not on file    Attends meetings of clubs or organizations: Not on file    Relationship status: Not on file  Other Topics Concern  . Not on file  Social History Narrative  . Not on file   Review of Systems - See HPI.  All other ROS are negative.  BP 130/60   Pulse (!) 29   Temp (!) 96.3 F (35.7 C) (Temporal)   Resp 14   Ht 5\' 2"  (1.575 m)   Wt 111 lb (50.3 kg)   SpO2 100%   BMI 20.30 kg/m   Physical Exam Vitals signs reviewed.  Constitutional:      Appearance: Normal appearance.  HENT:     Head: Normocephalic and atraumatic.     Right Ear: Tympanic membrane normal.     Left Ear: Tympanic membrane normal.  Cardiovascular:     Rate and Rhythm: Bradycardia present.     Pulses: Normal pulses.     Heart sounds: Normal heart sounds.  Pulmonary:     Effort: Pulmonary effort is  normal.     Breath sounds: Normal breath sounds.  Neurological:     General: No focal deficit present.     Mental Status: She is alert and oriented to person, place, and time.     Cranial Nerves: No cranial nerve deficit.     Coordination: Coordination normal.  Psychiatric:        Mood and Affect: Mood normal.    Assessment/Plan: 1. Bradycardia significant bradycardia which is new for her. Otherwise asymptomatic at present. EKG revealing bradycardia with ventricular rate of 59 bpm (after she had jumped off of the table to fix gown and jumped back on table per CMA). Some ectopic beats noted. Pulse remained 29-34 throughout duration of visit otherwise. Exam otherwise unremarkable. She is on low dose Carvedilol BID which is contributing. She was told to stop this. Contacted [facility] to discuss need for Cards consult today. Spoke with Dr. Gwenlyn Found who advised if she is otherwise stable, no ER or Card appt needed at present. Agreed stopping her BB with close follow-up in 2 days for reassessment. Strict ER precautions reviewed with patient. Follow-up scheduled for Thursday morning. Labs obtained today as noted below.  - EKG 12-Lead - CBC - Basic metabolic panel - TSH - Ambulatory referral to Cardiology - canceled after talk with Cardiologist.   Leeanne Rio, PA-C

## 2019-01-10 NOTE — Telephone Encounter (Signed)
Contacted [facility]ka who transferred call to [place], [place]. He spoke with Dr. Gwenlyn Found regarding this pt. Dr. Gwenlyn Found advised that pt should follow up with Einar Pheasant, PA-C in 2 days to check HR

## 2019-01-11 ENCOUNTER — Ambulatory Visit: Payer: Medicare HMO | Admitting: Physician Assistant

## 2019-01-11 ENCOUNTER — Telehealth: Payer: Self-pay | Admitting: *Deleted

## 2019-01-11 NOTE — Telephone Encounter (Signed)
A message was left, re: her new patient appointment. 

## 2019-01-12 ENCOUNTER — Telehealth: Payer: Self-pay | Admitting: Physician Assistant

## 2019-01-12 ENCOUNTER — Encounter (HOSPITAL_COMMUNITY)
Admission: EM | Disposition: A | Discharge status: Home / Self Care | Payer: Self-pay | Source: Home / Self Care | Attending: Emergency Medicine

## 2019-01-12 ENCOUNTER — Ambulatory Visit: Payer: Medicare HMO | Admitting: Physician Assistant

## 2019-01-12 ENCOUNTER — Ambulatory Visit (HOSPITAL_COMMUNITY)
Admission: EM | Admit: 2019-01-12 | Discharge: 2019-01-13 | Disposition: A | Discharge status: Home / Self Care | Payer: Medicare HMO | Attending: Internal Medicine | Admitting: Internal Medicine

## 2019-01-12 ENCOUNTER — Emergency Department (HOSPITAL_COMMUNITY): Payer: Medicare HMO

## 2019-01-12 ENCOUNTER — Other Ambulatory Visit: Payer: Self-pay

## 2019-01-12 ENCOUNTER — Emergency Department (HOSPITAL_BASED_OUTPATIENT_CLINIC_OR_DEPARTMENT_OTHER): Payer: Medicare HMO

## 2019-01-12 ENCOUNTER — Encounter (HOSPITAL_COMMUNITY): Payer: Self-pay | Admitting: Emergency Medicine

## 2019-01-12 DIAGNOSIS — I10 Essential (primary) hypertension: Secondary | ICD-10-CM | POA: Diagnosis not present

## 2019-01-12 DIAGNOSIS — I340 Nonrheumatic mitral (valve) insufficiency: Secondary | ICD-10-CM | POA: Diagnosis not present

## 2019-01-12 DIAGNOSIS — M542 Cervicalgia: Secondary | ICD-10-CM | POA: Diagnosis not present

## 2019-01-12 DIAGNOSIS — E785 Hyperlipidemia, unspecified: Secondary | ICD-10-CM | POA: Insufficient documentation

## 2019-01-12 DIAGNOSIS — Z20828 Contact with and (suspected) exposure to other viral communicable diseases: Secondary | ICD-10-CM | POA: Diagnosis not present

## 2019-01-12 DIAGNOSIS — I361 Nonrheumatic tricuspid (valve) insufficiency: Secondary | ICD-10-CM

## 2019-01-12 DIAGNOSIS — I442 Atrioventricular block, complete: Secondary | ICD-10-CM | POA: Diagnosis not present

## 2019-01-12 DIAGNOSIS — Z7982 Long term (current) use of aspirin: Secondary | ICD-10-CM | POA: Diagnosis not present

## 2019-01-12 DIAGNOSIS — I959 Hypotension, unspecified: Secondary | ICD-10-CM | POA: Diagnosis not present

## 2019-01-12 DIAGNOSIS — M1990 Unspecified osteoarthritis, unspecified site: Secondary | ICD-10-CM | POA: Diagnosis not present

## 2019-01-12 DIAGNOSIS — Z959 Presence of cardiac and vascular implant and graft, unspecified: Secondary | ICD-10-CM

## 2019-01-12 DIAGNOSIS — Z950 Presence of cardiac pacemaker: Secondary | ICD-10-CM | POA: Diagnosis not present

## 2019-01-12 DIAGNOSIS — Z880 Allergy status to penicillin: Secondary | ICD-10-CM | POA: Diagnosis not present

## 2019-01-12 DIAGNOSIS — R42 Dizziness and giddiness: Secondary | ICD-10-CM | POA: Diagnosis not present

## 2019-01-12 DIAGNOSIS — Z79899 Other long term (current) drug therapy: Secondary | ICD-10-CM | POA: Diagnosis not present

## 2019-01-12 DIAGNOSIS — R001 Bradycardia, unspecified: Secondary | ICD-10-CM

## 2019-01-12 DIAGNOSIS — I4430 Unspecified atrioventricular block: Secondary | ICD-10-CM | POA: Diagnosis not present

## 2019-01-12 DIAGNOSIS — Z882 Allergy status to sulfonamides status: Secondary | ICD-10-CM | POA: Diagnosis not present

## 2019-01-12 HISTORY — DX: Presence of cardiac pacemaker: Z95.0

## 2019-01-12 HISTORY — PX: PACEMAKER IMPLANT: EP1218

## 2019-01-12 HISTORY — PX: INSERT / REPLACE / REMOVE PACEMAKER: SUR710

## 2019-01-12 LAB — CBC
HCT: 35.6 % — ABNORMAL LOW (ref 36.0–46.0)
Hemoglobin: 11.6 g/dL — ABNORMAL LOW (ref 12.0–15.0)
MCH: 29.9 pg (ref 26.0–34.0)
MCHC: 32.6 g/dL (ref 30.0–36.0)
MCV: 91.8 fL (ref 80.0–100.0)
Platelets: 181 10*3/uL (ref 150–400)
RBC: 3.88 MIL/uL (ref 3.87–5.11)
RDW: 13.8 % (ref 11.5–15.5)
WBC: 7 10*3/uL (ref 4.0–10.5)
nRBC: 0 % (ref 0.0–0.2)

## 2019-01-12 LAB — SURGICAL PCR SCREEN
MRSA, PCR: NEGATIVE
Staphylococcus aureus: NEGATIVE

## 2019-01-12 LAB — BASIC METABOLIC PANEL
Anion gap: 10 (ref 5–15)
BUN: 38 mg/dL — ABNORMAL HIGH (ref 8–23)
CO2: 22 mmol/L (ref 22–32)
Calcium: 9.1 mg/dL (ref 8.9–10.3)
Chloride: 103 mmol/L (ref 98–111)
Creatinine, Ser: 1.08 mg/dL — ABNORMAL HIGH (ref 0.44–1.00)
GFR calc Af Amer: 55 mL/min — ABNORMAL LOW (ref >60)
GFR calc non Af Amer: 48 mL/min — ABNORMAL LOW (ref >60)
Glucose, Bld: 132 mg/dL — ABNORMAL HIGH (ref 70–99)
Potassium: 4.8 mmol/L (ref 3.5–5.1)
Sodium: 135 mmol/L (ref 135–145)

## 2019-01-12 LAB — ECHOCARDIOGRAM LIMITED

## 2019-01-12 LAB — SARS CORONAVIRUS 2 BY RT PCR (HOSPITAL ORDER, PERFORMED IN ~~LOC~~ HOSPITAL LAB): SARS Coronavirus 2: NEGATIVE

## 2019-01-12 LAB — MAGNESIUM: Magnesium: 2.7 mg/dL — ABNORMAL HIGH (ref 1.7–2.4)

## 2019-01-12 LAB — APTT: aPTT: 25 seconds (ref 24–36)

## 2019-01-12 LAB — PROTIME-INR
INR: 1 (ref 0.8–1.2)
Prothrombin Time: 12.9 seconds (ref 11.4–15.2)

## 2019-01-12 SURGERY — PACEMAKER IMPLANT

## 2019-01-12 MED ORDER — KETOTIFEN FUMARATE 0.025 % OP SOLN
1.0000 [drp] | Freq: Every day | OPHTHALMIC | Status: DC
  Administered 2019-01-13: 1 [drp] via OPHTHALMIC
  Filled 2019-01-12: qty 5

## 2019-01-12 MED ORDER — CARVEDILOL 12.5 MG PO TABS
12.5000 mg | ORAL_TABLET | Freq: Two times a day (BID) | ORAL | Status: DC
  Administered 2019-01-12 – 2019-01-13 (×2): 12.5 mg via ORAL
  Filled 2019-01-12 (×2): qty 1

## 2019-01-12 MED ORDER — SODIUM CHLORIDE 0.9 % IV SOLN
INTRAVENOUS | Status: AC
  Filled 2019-01-12: qty 2

## 2019-01-12 MED ORDER — SODIUM CHLORIDE 0.9 % IV SOLN
250.0000 mL | INTRAVENOUS | Status: DC | PRN
  Administered 2019-01-13: 250 mL via INTRAVENOUS

## 2019-01-12 MED ORDER — LIDOCAINE HCL (PF) 1 % IJ SOLN
INTRAMUSCULAR | Status: AC
  Filled 2019-01-12: qty 30

## 2019-01-12 MED ORDER — VANCOMYCIN HCL IN DEXTROSE 1-5 GM/200ML-% IV SOLN
1000.0000 mg | INTRAVENOUS | Status: AC
  Administered 2019-01-12: 1000 mg via INTRAVENOUS

## 2019-01-12 MED ORDER — FENTANYL CITRATE (PF) 100 MCG/2ML IJ SOLN
INTRAMUSCULAR | Status: AC
  Filled 2019-01-12: qty 2

## 2019-01-12 MED ORDER — ACETAMINOPHEN 325 MG PO TABS: 325.0000 mg | ORAL_TABLET | ORAL | Status: DC | PRN

## 2019-01-12 MED ORDER — VANCOMYCIN HCL IN DEXTROSE 1-5 GM/200ML-% IV SOLN
INTRAVENOUS | Status: AC
  Filled 2019-01-12: qty 200

## 2019-01-12 MED ORDER — HYDROCODONE-ACETAMINOPHEN 5-325 MG PO TABS: 1.0000 | ORAL_TABLET | ORAL | Status: DC | PRN

## 2019-01-12 MED ORDER — SODIUM CHLORIDE 0.9% FLUSH
3.0000 mL | Freq: Two times a day (BID) | INTRAVENOUS | Status: DC
  Administered 2019-01-12 – 2019-01-13 (×2): 3 mL via INTRAVENOUS

## 2019-01-12 MED ORDER — VANCOMYCIN HCL IN DEXTROSE 1-5 GM/200ML-% IV SOLN
1000.0000 mg | Freq: Two times a day (BID) | INTRAVENOUS | Status: AC
  Administered 2019-01-13: 1000 mg via INTRAVENOUS
  Filled 2019-01-12: qty 200

## 2019-01-12 MED ORDER — FLUTICASONE PROPIONATE 50 MCG/ACT NA SUSP
1.0000 | Freq: Every day | NASAL | Status: DC
  Filled 2019-01-12: qty 16

## 2019-01-12 MED ORDER — SODIUM CHLORIDE 0.9% FLUSH: 3.0000 mL | INTRAVENOUS | Status: DC | PRN

## 2019-01-12 MED ORDER — FENTANYL CITRATE (PF) 100 MCG/2ML IJ SOLN
INTRAMUSCULAR | Status: DC | PRN
  Administered 2019-01-12: 12.5 ug via INTRAVENOUS

## 2019-01-12 MED ORDER — LISINOPRIL 10 MG PO TABS
10.0000 mg | ORAL_TABLET | Freq: Two times a day (BID) | ORAL | Status: DC
  Administered 2019-01-12 – 2019-01-13 (×2): 10 mg via ORAL
  Filled 2019-01-12 (×2): qty 1

## 2019-01-12 MED ORDER — TRAMADOL HCL 50 MG PO TABS
50.0000 mg | ORAL_TABLET | Freq: Four times a day (QID) | ORAL | Status: DC | PRN
  Administered 2019-01-12 – 2019-01-13 (×2): 50 mg via ORAL
  Filled 2019-01-12 (×2): qty 1

## 2019-01-12 MED ORDER — IOHEXOL 350 MG/ML SOLN
INTRAVENOUS | Status: DC | PRN
  Administered 2019-01-12: 15 mL

## 2019-01-12 MED ORDER — SODIUM CHLORIDE 0.9 % IV SOLN
80.0000 mg | INTRAVENOUS | Status: AC
  Administered 2019-01-12: 80 mg

## 2019-01-12 MED ORDER — ONDANSETRON HCL 4 MG/2ML IJ SOLN
4.0000 mg | Freq: Four times a day (QID) | INTRAMUSCULAR | Status: DC | PRN
  Administered 2019-01-13: 4 mg via INTRAVENOUS
  Filled 2019-01-12: qty 2

## 2019-01-12 SURGICAL SUPPLY — 10 items
CABLE SURGICAL S-101-97-12 (CABLE) ×3 IMPLANT
IPG PACE AZUR XT DR MRI W1DR01 (Pacemaker) IMPLANT
KIT MICROPUNCTURE NIT STIFF (SHEATH) ×1 IMPLANT
LEAD CAPSURE NOVUS 45CM (Lead) ×1 IMPLANT
LEAD CAPSURE NOVUS 5076-58CM (Lead) ×1 IMPLANT
PACE AZURE XT DR MRI W1DR01 (Pacemaker) ×2 IMPLANT
PAD PRO RADIOLUCENT 2001M-C (PAD) ×2 IMPLANT
SHEATH 7FR PRELUDE SNAP 13 (SHEATH) ×2 IMPLANT
SHEATH PINNACLE 6F 10CM (SHEATH) ×1 IMPLANT
TRAY PACEMAKER INSERTION (PACKS) ×2 IMPLANT

## 2019-01-12 NOTE — Telephone Encounter (Signed)
Can you please follow-up with patient to see if she was taken to hospital. Otherwise she would need to call 911.

## 2019-01-12 NOTE — Interval H&P Note (Signed)
History and Physical Interval Note:  01/12/2019 2:54 PM  Bethany Carpenter  has presented today for surgery, with the diagnosis of heart block.  The various methods of treatment have been discussed with the patient and family. After consideration of risks, benefits and other options for treatment, the patient has consented to  Procedure(s): PACEMAKER IMPLANT (N/A) as a surgical intervention.  The patient's history has been reviewed, patient examined, no change in status, stable for surgery.  I have reviewed the patient's chart and labs.  Questions were answered to the patient's satisfaction.     Thompson Grayer

## 2019-01-12 NOTE — Consult Note (Addendum)
ELECTROPHYSIOLOGY CONSULT NOTE    Patient ID: Bethany Carpenter MRN: [id number], DOB/AGE: 1933/09/16 85 y.o.  Admit date: 01/12/2019 Date of Consult: 01/12/2019  Primary Physician: Brunetta Jeans, PA-C Primary Cardiologist: New Electrophysiologist: New to Dr. Rayann Heman  Referring Provider: Dr. Kathrynn Humble  Patient Profile: Bethany Carpenter is a 85 y.o. female with a history of HLD, HTN, and unspecified heart disease who is being seen today for the evaluation of CHB at the request of Dr. Kathrynn Humble.  HPI:  Bethany Carpenter is a 85 y.o. female who was seen by her PCP 01/10/2019 for fatigue and neck ache x 5 days; she has a history of cervicalgia but thought this felt different.  Instructed to continue her tramadol with no clear reason for there exacerbation. EKG that visit noted variable HB and coreg was stopped.   She presented to the ED today for continued neck, back pain, and fatigue. She has persistent advanced heart block including 2:1 block and CHB. EP asked to see for PPM consideration.    Pt feeling OK at rest. She has had fatigue and near syncope over the past week, worse with exertion.   She states she has a distance history of cardiology work up, in 2002-2004. She underwent catheterization and she does not recall having any blockages. She was told she had "broken heart" syndrome, but does not remember any specific discussion of the pumping function of her heart. She thinks she was told she would likely need a pacemaker at some point some years back.   Currently at rest, she denies chest pain, palpitations, dyspnea, PND, orthopnea, nausea, vomiting, dizziness, syncope, edema, weight gain, or early satiety.  Past Medical History:  Diagnosis Date  . Allergy   . Arthritis   . Cancer (HCC)    Skin  . Colon polyps   . Heart disease   . History of chickenpox   . Hyperlipidemia   . Hypertension      Surgical History:  Past Surgical History:  Procedure Laterality Date  . ANGIOPLASTY   03/08/2007  . BREAST BIOPSY  09/28/2003  . CATARACT EXTRACTION  11/25/2005   04/07/2006  . CERVICAL SPINE SURGERY  11/24/2012   02/02/2008  . TONSILLECTOMY  1952  . TUBAL LIGATION  1970     (Not in a hospital admission)   Inpatient Medications:   Allergies:  Allergies  Allergen Reactions  . Nickel   . Penicillins Rash  . Sulfa Antibiotics Rash    Social History   Socioeconomic History  . Marital status: Divorced    Spouse name: Not on file  . Number of children: Not on file  . Years of education: Not on file  . Highest education level: Not on file  Occupational History  . Occupation: Retired  Scientific laboratory technician  . Financial resource strain: Not on file  . Food insecurity    Worry: Not on file    Inability: Not on file  . Transportation needs    Medical: Not on file    Non-medical: Not on file  Tobacco Use  . Smoking status: Never Smoker  . Smokeless tobacco: Never Used  Substance and Sexual Activity  . Alcohol use: No    Frequency: Never  . Drug use: No  . Sexual activity: Not Currently  Lifestyle  . Physical activity    Days per week: Not on file    Minutes per session: Not on file  . Stress: Not on file  Relationships  . Social connections  Talks on phone: Not on file    Gets together: Not on file    Attends religious service: Not on file    Active member of club or organization: Not on file    Attends meetings of clubs or organizations: Not on file    Relationship status: Not on file  . Intimate partner violence    Fear of current or ex partner: Not on file    Emotionally abused: Not on file    Physically abused: Not on file    Forced sexual activity: Not on file  Other Topics Concern  . Not on file  Social History Narrative  . Not on file     Family History  Problem Relation Age of Onset  . Hearing loss Mother   . Hypertension Mother   . Osteoporosis Mother   . Arthritis Mother   . Cancer Father        Skin  . Hearing loss Father   .  Arthritis Father   . Diabetes Brother   . Stroke Brother   . Early death Brother   . Depression Daughter   . Early death Son   . Early death Maternal Grandfather   . Early death Paternal Grandfather      Review of Systems: All other systems reviewed and are otherwise negative except as noted above.  Physical Exam: Vitals:   01/12/19 1104 01/12/19 1114  BP: (!) 103/52   Pulse: (!) 43   Resp: 20   Temp:  98.2 F (36.8 C)  TempSrc:  Oral  SpO2: 98%     GEN- The patient is elderly appearing, alert and oriented x 3 today.   HEENT: normocephalic, atraumatic; sclera clear, conjunctiva pink; hearing intact; oropharynx clear; neck supple Lungs- Clear to ausculation bilaterally, normal work of breathing.  No wheezes, rales, rhonchi Heart- Marked bradycardia on exam. No murmurs, rubs or gallops appreciated GI- soft, non-tender, non-distended, bowel sounds present Extremities- no clubbing, cyanosis, or edema; DP/PT/radial pulses 2+ bilaterally MS- no significant deformity or atrophy Skin- warm and dry, no rash or lesion Psych- euthymic mood, full affect Neuro- strength and sensation are intact  Labs:   Lab Results  Component Value Date   WBC 6.1 01/10/2019   HGB 11.6 (L) 01/10/2019   HCT 35.4 (L) 01/10/2019   MCV 89.3 01/10/2019   PLT 176.0 01/10/2019    Recent Labs  Lab 01/10/19 1235  NA 135  K 4.7  CL 102  CO2 26  BUN 42*  CREATININE 1.29*  CALCIUM 9.6  GLUCOSE 108*      Radiology/Studies: No results found.  EKG: today shows CHB with escape beat at 26 bpm and QRS of 142 ms   (personally reviewed) EKG 01/10/2019 showed 2:1 AV block at 59 bpms, QRS 140  TELEMETRY: CHB with ventricular escape in the 20-30s. Atrial rate ranges from 60-80 (personally reviewed)  Assessment/Plan: 1. CHB Last dose of coreg 12.5 mg BID taken am of 10/6 Pt has persistent CHB despite wash out of her carvedilol and meets indications for PPM implantation.  She has an unclear history of  cardiomyopathy, and will thus plan stat echo to evaluate EF. If normal, will likely be good candidate for leadless pacemaker this afternoon. If EF abnormal, will need further work up and will discuss disposition with Dr. Rayann Heman.   For questions or updates, please contact [facility] Please consult [email or web address] for contact info under Cardiology/STEMI.  Jacalyn Lefevre, PA-C  Pager: [phone]  01/12/2019 11:24  AM  I have seen, examined the patient, and reviewed the above assessment and plan.  Changes to above are made where necessary.  On exam, bradycardic rhythm.  The patient has symptomatic complete heart block.  No reversible causes are found.  Her EF is preserved today.  I would therefore recommend pacemaker implantation at this time.  Risks, benefits, alternatives to pacemaker implantation were discussed in detail with the patient today. The patient understands that the risks include but are not limited to bleeding, infection, pneumothorax, perforation, tamponade, vascular damage, renal failure, MI, stroke, death,  and lead dislodgement and wishes to proceed. We will therefore proceed urgently to PPM implantation.   Co Sign: Thompson Grayer, MD 01/12/2019 2:52 PM

## 2019-01-12 NOTE — H&P (View-Only) (Signed)
ELECTROPHYSIOLOGY CONSULT NOTE    Patient ID: Bethany Carpenter MRN: [id number], DOB/AGE: 83-May-1935 83 y.o.  Admit date: 01/12/2019 Date of Consult: 01/12/2019  Primary Physician: Brunetta Jeans, PA-C Primary Cardiologist: New Electrophysiologist: New to Dr. Rayann Heman  Referring Provider: Dr. Kathrynn Humble  Patient Profile: Bethany Carpenter is a 85 y.o. female with a history of HLD, HTN, and unspecified heart disease who is being seen today for the evaluation of CHB at the request of Dr. Kathrynn Humble.  HPI:  Bethany Carpenter is a 85 y.o. female who was seen by her PCP 01/10/2019 for fatigue and neck ache x 5 days; she has a history of cervicalgia but thought this felt different.  Instructed to continue her tramadol with no clear reason for there exacerbation. EKG that visit noted variable HB and coreg was stopped.   She presented to the ED today for continued neck, back pain, and fatigue. She has persistent advanced heart block including 2:1 block and CHB. EP asked to see for PPM consideration.    Pt feeling OK at rest. She has had fatigue and near syncope over the past week, worse with exertion.   She states she has a distance history of cardiology work up, in 2002-2004. She underwent catheterization and she does not recall having any blockages. She was told she had "broken heart" syndrome, but does not remember any specific discussion of the pumping function of her heart. She thinks she was told she would likely need a pacemaker at some point some years back.   Currently at rest, she denies chest pain, palpitations, dyspnea, PND, orthopnea, nausea, vomiting, dizziness, syncope, edema, weight gain, or early satiety.  Past Medical History:  Diagnosis Date  . Allergy   . Arthritis   . Cancer (HCC)    Skin  . Colon polyps   . Heart disease   . History of chickenpox   . Hyperlipidemia   . Hypertension      Surgical History:  Past Surgical History:  Procedure Laterality Date  . ANGIOPLASTY   03/08/2007  . BREAST BIOPSY  09/28/2003  . CATARACT EXTRACTION  11/25/2005   04/07/2006  . CERVICAL SPINE SURGERY  11/24/2012   02/02/2008  . TONSILLECTOMY  1952  . TUBAL LIGATION  1970     (Not in a hospital admission)   Inpatient Medications:   Allergies:  Allergies  Allergen Reactions  . Nickel   . Penicillins Rash  . Sulfa Antibiotics Rash    Social History   Socioeconomic History  . Marital status: Divorced    Spouse name: Not on file  . Number of children: Not on file  . Years of education: Not on file  . Highest education level: Not on file  Occupational History  . Occupation: Retired  Scientific laboratory technician  . Financial resource strain: Not on file  . Food insecurity    Worry: Not on file    Inability: Not on file  . Transportation needs    Medical: Not on file    Non-medical: Not on file  Tobacco Use  . Smoking status: Never Smoker  . Smokeless tobacco: Never Used  Substance and Sexual Activity  . Alcohol use: No    Frequency: Never  . Drug use: No  . Sexual activity: Not Currently  Lifestyle  . Physical activity    Days per week: Not on file    Minutes per session: Not on file  . Stress: Not on file  Relationships  . Social connections  Talks on phone: Not on file    Gets together: Not on file    Attends religious service: Not on file    Active member of club or organization: Not on file    Attends meetings of clubs or organizations: Not on file    Relationship status: Not on file  . Intimate partner violence    Fear of current or ex partner: Not on file    Emotionally abused: Not on file    Physically abused: Not on file    Forced sexual activity: Not on file  Other Topics Concern  . Not on file  Social History Narrative  . Not on file     Family History  Problem Relation Age of Onset  . Hearing loss Mother   . Hypertension Mother   . Osteoporosis Mother   . Arthritis Mother   . Cancer Father        Skin  . Hearing loss Father   .  Arthritis Father   . Diabetes Brother   . Stroke Brother   . Early death Brother   . Depression Daughter   . Early death Son   . Early death Maternal Grandfather   . Early death Paternal Grandfather      Review of Systems: All other systems reviewed and are otherwise negative except as noted above.  Physical Exam: Vitals:   01/12/19 1104 01/12/19 1114  BP: (!) 103/52   Pulse: (!) 43   Resp: 20   Temp:  98.2 F (36.8 C)  TempSrc:  Oral  SpO2: 98%     GEN- The patient is elderly appearing, alert and oriented x 3 today.   HEENT: normocephalic, atraumatic; sclera clear, conjunctiva pink; hearing intact; oropharynx clear; neck supple Lungs- Clear to ausculation bilaterally, normal work of breathing.  No wheezes, rales, rhonchi Heart- Marked bradycardia on exam. No murmurs, rubs or gallops appreciated GI- soft, non-tender, non-distended, bowel sounds present Extremities- no clubbing, cyanosis, or edema; DP/PT/radial pulses 2+ bilaterally MS- no significant deformity or atrophy Skin- warm and dry, no rash or lesion Psych- euthymic mood, full affect Neuro- strength and sensation are intact  Labs:   Lab Results  Component Value Date   WBC 6.1 01/10/2019   HGB 11.6 (L) 01/10/2019   HCT 35.4 (L) 01/10/2019   MCV 89.3 01/10/2019   PLT 176.0 01/10/2019    Recent Labs  Lab 01/10/19 1235  NA 135  K 4.7  CL 102  CO2 26  BUN 42*  CREATININE 1.29*  CALCIUM 9.6  GLUCOSE 108*      Radiology/Studies: No results found.  EKG: today shows CHB with escape beat at 26 bpm and QRS of 142 ms   (personally reviewed) EKG 01/10/2019 showed 2:1 AV block at 59 bpms, QRS 140  TELEMETRY: CHB with ventricular escape in the 20-30s. Atrial rate ranges from 60-80 (personally reviewed)  Assessment/Plan: 1. CHB Last dose of coreg 12.5 mg BID taken am of 10/6 Pt has persistent CHB despite wash out of her carvedilol and meets indications for PPM implantation.  She has an unclear history of  cardiomyopathy, and will thus plan stat echo to evaluate EF. If normal, will likely be good candidate for leadless pacemaker this afternoon. If EF abnormal, will need further work up and will discuss disposition with Dr. Rayann Heman.   For questions or updates, please contact [place] Please consult [email or web address] for contact info under Cardiology/STEMI.  Jacalyn Lefevre, PA-C  Pager: [phone]  01/12/2019 11:24  AM  I have seen, examined the patient, and reviewed the above assessment and plan.  Changes to above are made where necessary.  On exam, bradycardic rhythm.  The patient has symptomatic complete heart block.  No reversible causes are found.  Her EF is preserved today.  I would therefore recommend pacemaker implantation at this time.  Risks, benefits, alternatives to pacemaker implantation were discussed in detail with the patient today. The patient understands that the risks include but are not limited to bleeding, infection, pneumothorax, perforation, tamponade, vascular damage, renal failure, MI, stroke, death,  and lead dislodgement and wishes to proceed. We will therefore proceed urgently to PPM implantation.   Co Sign: Thompson Grayer, MD 01/12/2019 2:52 PM

## 2019-01-12 NOTE — ED Provider Notes (Signed)
[facility] EMERGENCY DEPARTMENT Provider Note   CSN: [id number] Arrival date & time: 01/12/19  1046     History   Chief Complaint Chief Complaint  Patient presents with  . Heart Block    HPI Bethany Carpenter is a 85 y.o. female.     HPI  85 year old female comes in a chief complaint of dizziness and weakness. She has no known cardiac history.  She has history of hypertension and hyperlipidemia.  Patient reports that over the past few days she has been having episodes of weakness, chest discomfort.  She has had some dizziness with near fainting spell as well.  She has not seen the doctor, but last night she was unable to sleep because of chest discomfort and decided to come to the ER.  She denies any history of CAD with stent placement.  Past Medical History:  Diagnosis Date  . Allergy   . Arthritis   . Cancer (HCC)    Skin  . Colon polyps   . Heart disease   . History of chickenpox   . Hyperlipidemia   . Hypertension     Patient Active Problem List   Diagnosis Date Noted  . Essential hypertension 05/12/2017  . Hyperlipidemia 05/12/2017  . Chronic neck pain 05/12/2017  . Chronic low back pain without sciatica 05/12/2017  . Seasonal allergic rhinitis 05/12/2017  . Skin cancer 09/16/2016    Past Surgical History:  Procedure Laterality Date  . ANGIOPLASTY  03/08/2007  . BREAST BIOPSY  09/28/2003  . CATARACT EXTRACTION  11/25/2005   04/07/2006  . CERVICAL SPINE SURGERY  11/24/2012   02/02/2008  . TONSILLECTOMY  1952  . TUBAL LIGATION  1970     OB History   No obstetric history on file.      Home Medications    Prior to Admission medications   Medication Sig Start Date End Date Taking? Authorizing Provider  aspirin 81 MG EC tablet Take 81 mg by mouth daily.    Yes [provider]  fluticasone (FLONASE) 50 MCG/ACT nasal spray Place 1 spray into both nostrils daily.   Yes [provider]  ketotifen (ALAWAY) 0.025 %  ophthalmic solution Place 1 drop into both eyes daily.   Yes [provider]  lisinopril (ZESTRIL) 10 MG tablet Take 1 tablet (10 mg total) by mouth 2 (two) times daily. 08/09/18  Yes Brunetta Jeans, PA-C  simvastatin (ZOCOR) 40 MG tablet TAKE 1 TABLET DAILY Patient taking differently: Take 40 mg by mouth daily.  12/26/18  Yes Brunetta Jeans, PA-C  traMADol (ULTRAM) 50 MG tablet Take 1 tablet (50 mg total) by mouth every 6 (six) hours as needed. Patient taking differently: Take 50 mg by mouth every 6 (six) hours as needed for moderate pain.  12/22/18  Yes Midge Minium, MD  carvedilol (COREG) 12.5 MG tablet Take 1 tablet (12.5 mg total) by mouth 2 (two) times daily with a meal. 08/09/18   Brunetta Jeans, PA-C    Family History Family History  Problem Relation Age of Onset  . Hearing loss Mother   . Hypertension Mother   . Osteoporosis Mother   . Arthritis Mother   . Cancer Father        Skin  . Hearing loss Father   . Arthritis Father   . Diabetes Brother   . Stroke Brother   . Early death Brother   . Depression Daughter   . Early death Son   .  Early death Maternal Grandfather   . Early death Paternal Grandfather     Social History Social History   Tobacco Use  . Smoking status: Never Smoker  . Smokeless tobacco: Never Used  Substance Use Topics  . Alcohol use: No    Frequency: Never  . Drug use: No     Allergies   Nickel, Penicillins, and Sulfa antibiotics   Review of Systems Review of Systems  Constitutional: Positive for activity change.  Respiratory: Positive for shortness of breath.   Cardiovascular: Positive for chest pain.  Gastrointestinal: Positive for nausea. Negative for vomiting.  Allergic/Immunologic: Negative for immunocompromised state.  Neurological: Positive for dizziness.  Hematological: Does not bruise/bleed easily.  All other systems reviewed and are negative.    Physical Exam Updated Vital Signs BP 106/60 (BP  Location: Left Arm)   Pulse (!) 25   Temp 98.2 F (36.8 C) (Oral)   Resp 17   SpO2 93%   Physical Exam Vitals signs and nursing note reviewed.  Constitutional:      Appearance: She is well-developed.  HENT:     Head: Normocephalic and atraumatic.  Eyes:     Pupils: Pupils are equal, round, and reactive to light.  Neck:     Musculoskeletal: Neck supple.  Cardiovascular:     Rate and Rhythm: Normal rate and regular rhythm.     Heart sounds: Normal heart sounds.  Pulmonary:     Effort: Pulmonary effort is normal. No respiratory distress.  Abdominal:     General: There is no distension.     Palpations: Abdomen is soft.     Tenderness: There is no abdominal tenderness. There is no guarding or rebound.  Skin:    General: Skin is warm and dry.  Neurological:     Mental Status: She is alert and oriented to person, place, and time.      ED Treatments / Results  Labs (all labs ordered are listed, but only abnormal results are displayed) Labs Reviewed  BASIC METABOLIC PANEL - Abnormal; Notable for the following components:      Result Value   Glucose, Bld 132 (*)    BUN 38 (*)    Creatinine, Ser 1.08 (*)    GFR calc non Af Amer 48 (*)    GFR calc Af Amer 55 (*)    All other components within normal limits  MAGNESIUM - Abnormal; Notable for the following components:   Magnesium 2.7 (*)    All other components within normal limits  CBC - Abnormal; Notable for the following components:   Hemoglobin 11.6 (*)    HCT 35.6 (*)    All other components within normal limits  SARS CORONAVIRUS 2 (HOSPITAL ORDER, [facility])  SURGICAL PCR SCREEN  PROTIME-INR  APTT    EKG EKG Interpretation  Date/Time:  Thursday January 12 2019 10:53:38 EDT Ventricular Rate:  26 PR Interval:    QRS Duration: 142 QT Interval:  684 QTC Calculation: 450 R Axis:   58 Text Interpretation:  Complete AV block with wide QRS complex Right bundle branch block complete  heart block is new No old tracing to compare Confirmed by Varney Biles [phone]) on 01/12/2019 1:10:12 PM   Radiology Dg Chest Port 1 View  Result Date: 01/12/2019 CLINICAL DATA:  Bradycardia EXAM: PORTABLE CHEST 1 VIEW COMPARISON:  None FINDINGS: Lungs are clear. Heart size mildly enlarged. No signs of consolidation or pleural effusion. Osteopenia without acute bone finding. Signs of cervicothoracic  spinal fusion partially imaged. IMPRESSION: No acute cardiopulmonary disease. Electronically Signed   By: Zetta Bills M.D.   On: 01/12/2019 11:33    Procedures .Critical Care Performed by: Varney Biles, MD Authorized by: Varney Biles, MD   Critical care provider statement:    Critical care time (minutes):  38   Critical care was time spent personally by me on the following activities:  Discussions with consultants, evaluation of patient's response to treatment, examination of patient, ordering and performing treatments and interventions, ordering and review of laboratory studies, ordering and review of radiographic studies, pulse oximetry, re-evaluation of patient's condition, obtaining history from patient or surrogate and review of old charts   (including critical care time)  Medications Ordered in ED Medications - No data to display   Initial Impression / Assessment and Plan / ED Course  I have reviewed the triage vital signs and the nursing notes.  Pertinent labs & imaging results that were available during my care of the patient were reviewed by me and considered in my medical decision making (see chart for details).        85 year old comes in a chief complaint weakness.  She is noted to be in complete heart block.  She reports she has been feeling sick for the last few days now and has been thinking about coming to the ER, but has been deferring the visit until last night when she could not sleep.  She has had episodes of dizziness and near fainting.  She has had episodes  of nonspecific chest pain and shortness of breath.  On exam she has bradycardia.  We will put her on cardiac monitor.  Hemodynamically she is stable and she will not require atropine at this time.  I have already consulted cardiology as she will likely need a pacemaker.  Patient states that she did have a cath in [place] a few years back.  No recent provocative cardiac testing.  No recent illnesses.   Final Clinical Impressions(s) / ED Diagnoses   Final diagnoses:  Complete heart block (HCC)  Symptomatic bradycardia    ED Discharge Orders         Ordered    Amb referral to [facility] Clinic     01/12/19 [place], [place], MD 01/12/19 1321

## 2019-01-12 NOTE — ED Triage Notes (Signed)
Pt arrives to ED from home with complaints of worsening chronic back and neck pain. On EMS arrival patient was found to be in complete heart block vs type 2 second degree heart block with HR ranging between 30's to 80's and pulse in the 40's. Patient was found to be in the same rhythm two days ago and was instructed to stop taking her carvedilol which she had done. No other complaints at this time.

## 2019-01-12 NOTE — ED Notes (Signed)
Cardiology at bedside.

## 2019-01-12 NOTE — Telephone Encounter (Signed)
Update - patient went to ER via EMS. Will keep eye out on her notes.

## 2019-01-12 NOTE — Telephone Encounter (Signed)
Patient called. She states she "had a bad night". She is going to see if her neighbor can take her to the hospital. She did not cancel her appointment since she doesn't know if her neighbor can take her to the hospital. Patient states she is afraid to drive with her heart rate so low.

## 2019-01-13 ENCOUNTER — Other Ambulatory Visit: Payer: Self-pay

## 2019-01-13 ENCOUNTER — Encounter (HOSPITAL_COMMUNITY): Payer: Self-pay | Admitting: Internal Medicine

## 2019-01-13 ENCOUNTER — Ambulatory Visit (HOSPITAL_COMMUNITY): Payer: Medicare HMO

## 2019-01-13 DIAGNOSIS — I517 Cardiomegaly: Secondary | ICD-10-CM | POA: Diagnosis not present

## 2019-01-13 DIAGNOSIS — M1990 Unspecified osteoarthritis, unspecified site: Secondary | ICD-10-CM | POA: Diagnosis not present

## 2019-01-13 DIAGNOSIS — Z950 Presence of cardiac pacemaker: Secondary | ICD-10-CM | POA: Diagnosis not present

## 2019-01-13 DIAGNOSIS — E785 Hyperlipidemia, unspecified: Secondary | ICD-10-CM | POA: Diagnosis not present

## 2019-01-13 DIAGNOSIS — Z20828 Contact with and (suspected) exposure to other viral communicable diseases: Secondary | ICD-10-CM | POA: Diagnosis not present

## 2019-01-13 DIAGNOSIS — Z7982 Long term (current) use of aspirin: Secondary | ICD-10-CM | POA: Diagnosis not present

## 2019-01-13 DIAGNOSIS — I442 Atrioventricular block, complete: Secondary | ICD-10-CM | POA: Diagnosis not present

## 2019-01-13 DIAGNOSIS — Z882 Allergy status to sulfonamides status: Secondary | ICD-10-CM | POA: Diagnosis not present

## 2019-01-13 DIAGNOSIS — Z79899 Other long term (current) drug therapy: Secondary | ICD-10-CM | POA: Diagnosis not present

## 2019-01-13 DIAGNOSIS — Z880 Allergy status to penicillin: Secondary | ICD-10-CM | POA: Diagnosis not present

## 2019-01-13 DIAGNOSIS — I10 Essential (primary) hypertension: Secondary | ICD-10-CM | POA: Diagnosis not present

## 2019-01-13 MED ORDER — ACETAMINOPHEN 325 MG PO TABS: 325.0000 mg | ORAL_TABLET | ORAL | Status: DC | PRN

## 2019-01-13 MED FILL — Lidocaine HCl Local Inj 1%: INTRAMUSCULAR | Qty: 40 | Status: AC

## 2019-01-13 MED FILL — Gentamicin Sulfate Inj 40 MG/ML: INTRAMUSCULAR | Qty: 80 | Status: AC

## 2019-01-13 NOTE — Progress Notes (Signed)
Patient BP 168/70 with no prns ordered. Cardiology notified.

## 2019-01-13 NOTE — Progress Notes (Signed)
Resident is alert and oriented x 4, takes her medications whole without any difficulties. Bowel sounds present x 4 quadrants, lung sounds clear bilateral. No  Respiratory difficulty noted during assessment. She has a new pace maker intact to her left upper chest, site intact, dressing dry with no s/s of infection noted to area. Pt rates her pain as 2 to her neck, according her her it radiating from her back. She suppose to be going home today according to report. We will continue  To monitor her progress.

## 2019-01-13 NOTE — Discharge Summary (Addendum)
ELECTROPHYSIOLOGY PROCEDURE DISCHARGE SUMMARY    Patient ID: Bethany Carpenter,  MRN: [id number], DOB/AGE: [birth year] 85 y.o.  Admit date: 01/12/2019 Discharge date: 01/13/2019  Primary Care Physician: Brunetta Jeans, PA-C  Primary Cardiologist: No primary care provider on file.  Electrophysiologist: Dr. Rayann Heman  Primary Discharge Diagnosis:  Symptomatic complete heart block status post pacemaker implantation this admission  Secondary Discharge Diagnosis:  HTN  Allergies  Allergen Reactions  . Nickel   . Penicillins Rash    Did it involve swelling of the face/tongue/throat, SOB, or low BP? No Did it involve sudden or severe rash/hives, skin peeling, or any reaction on the inside of your mouth or nose? Yes Did you need to seek medical attention at a hospital or doctor's office? Yes When did it last happen?years ago  If all above answers are "NO", may proceed with cephalosporin use.   . Sulfa Antibiotics Rash     Procedures This Admission:  1.  Implantation of a Medtronic dual chamber PPM on 01/12/2019 by Dr. Rayann Heman.  The patient received a Medtronic model number [id number] PPM with model number [id number] right atrial lead and 5076-58 right ventricular lead. There were no immediate post procedure complications. 2.  CXR on 01/13/2019 demonstrated no pneumothorax status post device implantation.   Brief HPI: Bethany Carpenter is a 85 y.o. female was referred to electrophysiology in the outpatient setting for consideration of PPM implantation.  Past medical history includes above.  The patient has had symptomatic bradycardia without reversible causes identified.  Risks, benefits, and alternatives to PPM implantation were reviewed with the patient who wished to proceed.   Hospital Course:  The patient was admitted with CHB without reversible cause and underwent implantation of a Medtronic dual chamber PPM with details as outlined above.  She was monitored on telemetry overnight which  demonstrated appropriate pacing.  Left chest was without hematoma or ecchymosis.  The device was interrogated and found to be functioning normally.  CXR was obtained and demonstrated no pneumothorax status post device implantation.  Wound care, arm mobility, and restrictions were reviewed with the patient. Her home meds were resume for her HTN. The patient was examined and considered stable for discharge to home.   Physical Exam: Vitals:   01/13/19 0413 01/13/19 0739 01/13/19 0753 01/13/19 0829  BP: (!) 168/70 (!) 141/58 (!) 141/58 (!) 141/58  Pulse: 69 68 70 70  Resp: 17 20  20   Temp: 97.9 F (36.6 C) 98.2 F (36.8 C)  98.2 F (36.8 C)  TempSrc: Oral Oral    SpO2: 99% 99%  97%  Weight:      Height:        GEN- The patient is well appearing, alert and oriented x 3 today.   HEENT: normocephalic, atraumatic; sclera clear, conjunctiva pink; hearing intact; oropharynx clear; neck supple, no JVP Lymph- no cervical lymphadenopathy Lungs- Clear to ausculation bilaterally, normal work of breathing.  No wheezes, rales, rhonchi Heart- Regular rate and rhythm, no murmurs, rubs or gallops, PMI not laterally displaced GI- soft, non-tender, non-distended, bowel sounds present, no hepatosplenomegaly Extremities- no clubbing, cyanosis, or edema; DP/PT/radial pulses 2+ bilaterally MS- no significant deformity or atrophy Skin- warm and dry, no rash or lesion, left chest without hematoma/ecchymosis Psych- euthymic mood, full affect Neuro- strength and sensation are intact  Labs:   Lab Results  Component Value Date   WBC 7.0 01/12/2019   HGB 11.6 (L) 01/12/2019   HCT 35.6 (L) 01/12/2019   MCV 91.8 01/12/2019  PLT 181 01/12/2019    Recent Labs  Lab 01/12/19 1123  NA 135  K 4.8  CL 103  CO2 22  BUN 38*  CREATININE 1.08*  CALCIUM 9.1  GLUCOSE 132*    Discharge Medications:  Allergies as of 01/13/2019      Reactions   Nickel    Penicillins Rash   Did it involve swelling of the  face/tongue/throat, SOB, or low BP? No Did it involve sudden or severe rash/hives, skin peeling, or any reaction on the inside of your mouth or nose? Yes Did you need to seek medical attention at a hospital or doctor's office? Yes When did it last happen?years ago  If all above answers are "NO", may proceed with cephalosporin use.   Sulfa Antibiotics Rash      Medication List    TAKE these medications   acetaminophen 325 MG tablet Commonly known as: TYLENOL Take 1-2 tablets (325-650 mg total) by mouth every 4 (four) hours as needed for mild pain.   [place] 0.025 % ophthalmic solution Generic drug: ketotifen Place 1 drop into both eyes daily.   aspirin 81 MG EC tablet Take 81 mg by mouth daily.   carvedilol 12.5 MG tablet Commonly known as: COREG Take 1 tablet (12.5 mg total) by mouth 2 (two) times daily with a meal.   fluticasone 50 MCG/ACT nasal spray Commonly known as: FLONASE Place 1 spray into both nostrils daily.   lisinopril 10 MG tablet Commonly known as: ZESTRIL Take 1 tablet (10 mg total) by mouth 2 (two) times daily.   simvastatin 40 MG tablet Commonly known as: ZOCOR TAKE 1 TABLET DAILY   traMADol 50 MG tablet Commonly known as: ULTRAM Take 1 tablet (50 mg total) by mouth every 6 (six) hours as needed. What changed: reasons to take this       Disposition:  Discharge Instructions    Amb referral to [facility] Clinic   Complete by: As directed      Follow-up Information    Thompson Grayer, MD Follow up on 04/17/2019.   Specialty: Cardiology Why: at 2 pm for 3 month pacer check Contact information: [address] [place] [address] [phone]        [facility] Follow up on 01/26/2019.   Why: at 1100 am for post pacemaker wound check Contact information: [place] [phone]          Duration of Discharge Encounter:  Greater than 30 minutes including physician time.  Signed, Shirley Friar, PA-C  01/13/2019 8:39 AM  I have seen, examined the patient, and reviewed the above assessment and plan.  Changes to above are made where necessary.  On exam, RRR.  Doing well s/p PPM. Device interrogation is reviewed and normal.  CXR reveals stable leads, no ptx.  Co Sign: Thompson Grayer, MD 01/13/2019 9:46 PM

## 2019-01-13 NOTE — Discharge Instructions (Signed)
After Your Pacemaker   You have a Medtronic Pacemaker   Do not lift your arm above shoulder height for 1 week after your procedure. After 7 days, you may progress as below.     Thursday January 19, 2019  Friday January 20, 2019 Saturday January 21, 2019 Sunday January 22, 2019    Do not lift, push, pull, or carry anything over 10 pounds with the affected arm until 6 weeks (Friday February 24, 2019) after your procedure.    Do not drive until your wound check, or until instructed by your healthcare provider that you are safe to do so.    Monitor your pacemaker site for redness, swelling, and drainage. Call the device clinic at [phone] if you experience these symptoms or fever/chills.   If your incision is sealed with Steri-strips or staples. You may shower 7 days after your procedure and wash your incision with soap and water as long as it is healed. If your incision is closed with Dermabond/Surgical glue. You may shower 1 day after your pacemaker implant and wash your incision with soap and water. Avoid lotions, ointments, or perfumes over your incision until it is well-healed.   You may use a hot tub or a pool AFTER your wound check appointment if the incision is completely closed.   Your Pacemaker may be MRI compatible. We will discuss this at your first follow up/wound check. .    Remote monitoring is used to monitor your pacemaker from home. This monitoring is scheduled every 91 days by our office. It allows [place] to keep an eye on the functioning of your device to ensure it is working properly. You will routinely see your Electrophysiologist annually (more often if necessary).    Pacemaker Implantation, Care After This sheet gives you information about how to care for yourself after your procedure. Your health care provider may also give you more specific instructions. If you have problems or questions, contact your health care provider. What can I expect after the  procedure? After the procedure, it is common to have:  Mild pain.  Slight bruising.  Some swelling over the incision.  A slight bump over the skin where the device was placed. Sometimes, it is possible to feel the device under the skin. This is normal.  You should received your Pacemaker ID card within 4-8 weeks. Follow these instructions at home: Medicines  Take over-the-counter and prescription medicines only as told by your health care provider.  If you were prescribed an antibiotic medicine, take it as told by your health care provider. Do not stop taking the antibiotic even if you start to feel better. Wound care     Do not remove the bandage on your chest until directed to do so by your health care provider.  After your bandage is removed, you may see pieces of tape called skin adhesive strips over the area where the cut was made (incision site). Let them fall off on their own.  Check the incision site every day to make sure it is not infected, bleeding, or starting to pull apart.  Do not use lotions or ointments near the incision site unless directed to do so.  Keep the incision area clean and dry for 7 days after the procedure or as directed by your health care provider. It takes several weeks for the incision site to completely heal.  Do not take baths, swim, or use a hot tub for 7-10 days or as otherwise directed by your  health care provider. Activity  Do not drive or use heavy machinery while taking prescription pain medicine.  Do not drive for 24 hours if you were given a medicine to help you relax (sedative).  Check with your health care provider before you start to drive or play sports.  Avoid sudden jerking, pulling, or chopping movements that pull your upper arm far away from your body. Avoid these movements for at least 6 weeks or as long as told by your health care provider.  Do not lift your upper arm above your shoulders for at least 6 weeks or as long  as told by your health care provider. This means no tennis, golf, or swimming.  You may go back to work when your health care provider says it is okay. Pacemaker care  You may be shown how to transfer data from your pacemaker through the phone to your health care provider.  Always let all health care providers know about your pacemaker before you have any medical procedures or tests.  Wear a medical ID bracelet or necklace stating that you have a pacemaker. Carry a pacemaker ID card with you at all times.  Your pacemaker battery will last for 5-15 years. Routine checks by your health care provider will let the health care provider know when the battery is starting to run down. The pacemaker will need to be replaced when the battery starts to run down.  Do not use amateur Chief of Staff. Other electrical devices are safe to use, including power tools, lawn mowers, and speakers. If you are unsure of whether something is safe to use, ask your health care provider.  When using your cell phone, hold it to the ear opposite the pacemaker. Do not leave your cell phone in a pocket over the pacemaker.  Avoid places or objects that have a strong electric or magnetic field, including: ? Airport Herbalist. When at the airport, let officials know that you have a pacemaker. ? Power plants. ? Large electrical generators. ? Radiofrequency transmission towers, such as cell phone and radio towers. General instructions  Weigh yourself every day. If you suddenly gain weight, fluid may be building up in your body.  Keep all follow-up visits as told by your health care provider. This is important. Contact a health care provider if:  You gain weight suddenly.  Your legs or feet swell.  It feels like your heart is fluttering or skipping beats (heart palpitations).  You have chills or a fever.  You have more redness, swelling, or pain around your incisions.  You have  more fluid or blood coming from your incisions.  Your incisions feel warm to the touch.  You have pus or a bad smell coming from your incisions. Get help right away if:  You have chest pain.  You have trouble breathing or are short of breath.  You become extremely tired.  You are light-headed or you faint. This information is not intended to replace advice given to you by your health care provider. Make sure you discuss any questions you have with your health care provider.

## 2019-01-13 NOTE — Progress Notes (Signed)
Patient has home medications in the room.  Patient does not want to send to pharmacy since planned d/c tomorrow.  RN educated patient on not using home medication for safety reasons while in the hospital and patient was agreeable to leave in bag in the closet.

## 2019-01-16 ENCOUNTER — Telehealth: Payer: Self-pay

## 2019-01-16 NOTE — Telephone Encounter (Signed)
LM requesting call back to complete TCM. Hospital follow up scheduled 10.13.20

## 2019-01-17 ENCOUNTER — Ambulatory Visit (INDEPENDENT_AMBULATORY_CARE_PROVIDER_SITE_OTHER): Payer: Medicare HMO | Admitting: Physician Assistant

## 2019-01-17 ENCOUNTER — Encounter: Payer: Self-pay | Admitting: Physician Assistant

## 2019-01-17 ENCOUNTER — Other Ambulatory Visit: Payer: Self-pay

## 2019-01-17 VITALS — BP 150/64 | HR 76 | Temp 97.0°F | Resp 14 | Ht 61.0 in | Wt 111.0 lb

## 2019-01-17 DIAGNOSIS — I442 Atrioventricular block, complete: Secondary | ICD-10-CM

## 2019-01-17 LAB — BASIC METABOLIC PANEL
BUN: 21 mg/dL (ref 6–23)
CO2: 28 mEq/L (ref 19–32)
Calcium: 8.8 mg/dL (ref 8.4–10.5)
Chloride: 104 mEq/L (ref 96–112)
Creatinine, Ser: 0.78 mg/dL (ref 0.40–1.20)
GFR: 70.55 mL/min (ref >60.00)
Glucose, Bld: 102 mg/dL — ABNORMAL HIGH (ref 70–99)
Potassium: 4.5 mEq/L (ref 3.5–5.1)
Sodium: 137 mEq/L (ref 135–145)

## 2019-01-17 LAB — MAGNESIUM: Magnesium: 2.3 mg/dL (ref 1.5–2.5)

## 2019-01-17 NOTE — Progress Notes (Signed)
Patient presents to clinic today for Hospital follow-up for CHB and pacemaker implantation. Patient sent to the ER on 01/12/2019 by this office due to continued bradycardia despite cessation of her BB medications. Was noted to have new heart block. EP consulted and patient had pacemaker implanted. Hospital stay uncomplicated. Patient remained stable throughout stay. Was discharged home on 01/13/2019 with scheduled PCP and Cardiology follow-up (01/24/19).  Since discharge, patient endorses doing well overall. Patient denies chest pain, palpitations, lightheadedness, dizziness, vision changes or frequent headaches. Some residual soreness at implantation site without drainage, increased redness, fever, chills or malaise. Feels much better than before, having more energy to get things done. .   Past Medical History:  Diagnosis Date  . Allergy   . Arthritis   . Cancer (HCC)    Skin  . Colon polyps   . Heart disease   . History of chickenpox   . Hyperlipidemia   . Hypertension   . Presence of permanent cardiac pacemaker 01/12/2019    Current Outpatient Medications on File Prior to Visit  Medication Sig Dispense Refill  . aspirin 81 MG EC tablet Take 81 mg by mouth daily.     . carvedilol (COREG) 12.5 MG tablet Take 1 tablet (12.5 mg total) by mouth 2 (two) times daily with a meal. 180 tablet 1  . fluticasone (FLONASE) 50 MCG/ACT nasal spray Place 1 spray into both nostrils daily.    Marland Kitchen ketotifen (ALAWAY) 0.025 % ophthalmic solution Place 1 drop into both eyes daily.    Marland Kitchen lisinopril (ZESTRIL) 10 MG tablet Take 1 tablet (10 mg total) by mouth 2 (two) times daily. 180 tablet 1  . simvastatin (ZOCOR) 40 MG tablet TAKE 1 TABLET DAILY (Patient taking differently: Take 40 mg by mouth daily. ) 90 tablet 1  . traMADol (ULTRAM) 50 MG tablet Take 1 tablet (50 mg total) by mouth every 6 (six) hours as needed. (Patient taking differently: Take 50 mg by mouth every 6 (six) hours as needed for moderate pain. )  120 tablet 0   No current facility-administered medications on file prior to visit.     Allergies  Allergen Reactions  . Other Hives and Itching    Fragrances  . Nickel   . Penicillins Rash    Did it involve swelling of the face/tongue/throat, SOB, or low BP? No Did it involve sudden or severe rash/hives, skin peeling, or any reaction on the inside of your mouth or nose? Yes Did you need to seek medical attention at a hospital or doctor's office? Yes When did it last happen?years ago  If all above answers are "NO", may proceed with cephalosporin use.   . Sulfa Antibiotics Rash    Family History  Problem Relation Age of Onset  . Hearing loss Mother   . Hypertension Mother   . Osteoporosis Mother   . Arthritis Mother   . Cancer Father        Skin  . Hearing loss Father   . Arthritis Father   . Diabetes Brother   . Stroke Brother   . Early death Brother   . Depression Daughter   . Early death Son   . Early death Maternal Grandfather   . Early death Paternal Grandfather     Social History   Socioeconomic History  . Marital status: Divorced    Spouse name: Not on file  . Number of children: Not on file  . Years of education: Not on file  . Highest  education level: Not on file  Occupational History  . Occupation: Retired  Scientific laboratory technician  . Financial resource strain: Not on file  . Food insecurity    Worry: Not on file    Inability: Not on file  . Transportation needs    Medical: Not on file    Non-medical: Not on file  Tobacco Use  . Smoking status: Never Smoker  . Smokeless tobacco: Never Used  Substance and Sexual Activity  . Alcohol use: No    Frequency: Never  . Drug use: No  . Sexual activity: Not Currently  Lifestyle  . Physical activity    Days per week: Not on file    Minutes per session: Not on file  . Stress: Not on file  Relationships  . Social Herbalist on phone: Not on file    Gets together: Not on file    Attends  religious service: Not on file    Active member of club or organization: Not on file    Attends meetings of clubs or organizations: Not on file    Relationship status: Not on file  Other Topics Concern  . Not on file  Social History Narrative  . Not on file   Review of Systems - See HPI.  All other ROS are negative.  BP (!) 150/64   Pulse 76   Temp (!) 97 F (36.1 C) (Temporal)   Resp 14   Ht 5\' 1"  (1.549 m)   Wt 111 lb (50.3 kg)   SpO2 90%   BMI 20.97 kg/m   Physical Exam Vitals signs reviewed.  Constitutional:      Appearance: Normal appearance.  HENT:     Head: Normocephalic and atraumatic.     Right Ear: Tympanic membrane normal.     Left Ear: Tympanic membrane normal.     Nose: Nose normal.     Mouth/Throat:     Mouth: Mucous membranes are moist.  Eyes:     Conjunctiva/sclera: Conjunctivae normal.     Pupils: Pupils are equal, round, and reactive to light.  Neck:     Musculoskeletal: Neck supple.  Cardiovascular:     Rate and Rhythm: Normal rate and regular rhythm.     Pulses: Normal pulses.     Heart sounds: Normal heart sounds.  Pulmonary:     Effort: Pulmonary effort is normal.  Chest:    Abdominal:     Palpations: Abdomen is soft.  Neurological:     General: No focal deficit present.     Mental Status: She is alert and oriented to person, place, and time.  Psychiatric:        Mood and Affect: Mood normal.     Recent Results (from the past 2160 hour(s))  CBC     Status: Abnormal   Collection Time: 01/10/19 12:35 PM  Result Value Ref Range   WBC 6.1 4.0 - 10.5 K/uL   RBC 3.97 3.87 - 5.11 Mil/uL   Platelets 176.0 150.0 - 400.0 K/uL   Hemoglobin 11.6 (L) 12.0 - 15.0 g/dL   HCT 35.4 (L) 36.0 - 46.0 %   MCV 89.3 78.0 - 100.0 fl   MCHC 32.7 30.0 - 36.0 g/dL   RDW 14.5 11.5 - [id number] %  Basic metabolic panel     Status: Abnormal   Collection Time: 01/10/19 12:35 PM  Result Value Ref Range   Sodium 135 135 - 145 mEq/L   Potassium 4.7 3.5 - 5.1  mEq/L  Chloride 102 96 - 112 mEq/L   CO2 26 19 - 32 mEq/L   Glucose, Bld 108 (H) 70 - 99 mg/dL   BUN 42 (H) 6 - 23 mg/dL   Creatinine, Ser 1.29 (H) 0.40 - 1.20 mg/dL   Calcium 9.6 8.4 - 10.5 mg/dL   GFR 39.48 (L) >60.00 mL/min  TSH     Status: None   Collection Time: 01/10/19 12:35 PM  Result Value Ref Range   TSH 2.36 0.35 - 4.50 uIU/mL  Basic metabolic panel     Status: Abnormal   Collection Time: 01/12/19 11:23 AM  Result Value Ref Range   Sodium 135 135 - 145 mmol/L   Potassium 4.8 3.5 - 5.1 mmol/L   Chloride 103 98 - 111 mmol/L   CO2 22 22 - 32 mmol/L   Glucose, Bld 132 (H) 70 - 99 mg/dL   BUN 38 (H) 8 - 23 mg/dL   Creatinine, Ser 1.08 (H) 0.44 - 1.00 mg/dL   Calcium 9.1 8.9 - 10.3 mg/dL   GFR calc non Af Amer 48 (L) >60 mL/min   GFR calc Af Amer 55 (L) >60 mL/min   Anion gap 10 5 - 15    Comment: Performed at [facility], [place] [address] [place], [place] [address]  Magnesium     Status: Abnormal   Collection Time: 01/12/19 11:23 AM  Result Value Ref Range   Magnesium 2.7 (H) 1.7 - 2.4 mg/dL    Comment: Performed at [facility] [address] [place], [place] [address]  CBC     Status: Abnormal   Collection Time: 01/12/19 11:23 AM  Result Value Ref Range   WBC 7.0 4.0 - 10.5 K/uL   RBC 3.88 3.87 - 5.11 MIL/uL   Hemoglobin 11.6 (L) 12.0 - 15.0 g/dL   HCT 35.6 (L) 36.0 - 46.0 %   MCV 91.8 80.0 - 100.0 fL   MCH 29.9 26.0 - 34.0 pg   MCHC 32.6 30.0 - 36.0 g/dL   RDW 13.8 11.5 - 15.5 %   Platelets 181 150 - 400 K/uL   nRBC 0.0 0.0 - 0.2 %    Comment: Performed at [facility], [place] [address] [place], [place] [address]  Protime-INR (if pt is taking coumadin)     Status: None   Collection Time: 01/12/19 11:23 AM  Result Value Ref Range   Prothrombin Time 12.9 11.4 - 15.2 seconds   INR 1.0 0.8 - 1.2    Comment: (NOTE) INR goal varies based on device and disease states. Performed at [facility], [place] [address] [place], [place]  [address]   APTT  (IF patient is taking Pradaxa)     Status: None   Collection Time: 01/12/19 11:23 AM  Result Value Ref Range   aPTT 25 24 - 36 seconds    Comment: Performed at [facility] [address] [place],  [address]  SARS Coronavirus 2 [facility] order, Performed in [facility] hospital lab) Nasopharyngeal Nasopharyngeal Swab     Status: None   Collection Time: 01/12/19 11:23 AM   Specimen: Nasopharyngeal Swab  Result Value Ref Range   SARS Coronavirus 2 NEGATIVE NEGATIVE    Comment: (NOTE) If result is NEGATIVE SARS-CoV-2 target nucleic acids are NOT DETECTED. The SARS-CoV-2 RNA is generally detectable in upper and lower  respiratory specimens during the acute phase of infection. The lowest  concentration of SARS-CoV-2 viral copies this assay can  detect is 250  copies / mL. A negative result does not preclude SARS-CoV-2 infection  and should not be used as the sole basis for treatment or other  patient management decisions.  A negative result may occur with  improper specimen collection / handling, submission of specimen other  than nasopharyngeal swab, presence of viral mutation(s) within the  areas targeted by this assay, and inadequate number of viral copies  (<250 copies / mL). A negative result must be combined with clinical  observations, patient history, and epidemiological information. If result is POSITIVE SARS-CoV-2 target nucleic acids are DETECTED. The SARS-CoV-2 RNA is generally detectable in upper and lower  respiratory specimens dur ing the acute phase of infection.  Positive  results are indicative of active infection with SARS-CoV-2.  Clinical  correlation with patient history and other diagnostic information is  necessary to determine patient infection status.  Positive results do  not rule out bacterial infection or co-infection with other viruses. If result is PRESUMPTIVE POSTIVE SARS-CoV-2 nucleic acids MAY BE PRESENT.   A presumptive  positive result was obtained on the submitted specimen  and confirmed on repeat testing.  While 2019 novel coronavirus  (SARS-CoV-2) nucleic acids may be present in the submitted sample  additional confirmatory testing may be necessary for epidemiological  and / or clinical management purposes  to differentiate between  SARS-CoV-2 and other Sarbecovirus currently known to infect humans.  If clinically indicated additional testing with an alternate test  methodology [phone]) is advised. The SARS-CoV-2 RNA is generally  detectable in upper and lower respiratory sp ecimens during the acute  phase of infection. The expected result is Negative. Fact Sheet for Patients:  StrictlyIdeas.no Fact Sheet for Healthcare Providers: BankingDealers.co.za This test is not yet approved or cleared by the [place] FDA and has been authorized for detection and/or diagnosis of SARS-CoV-2 by FDA under an Emergency Use Authorization (EUA).  This EUA will remain in effect (meaning this test can be used) for the duration of the COVID-19 declaration under Section 564(b)(1) of the Act, 21 U.S.C. section 360bbb-3(b)(1), unless the authorization is terminated or revoked sooner. Performed at [facility], [place] [address] [place], [place] [address]   ECHOCARDIOGRAM LIMITED     Status: None   Collection Time: 01/12/19 12:39 PM  Result Value Ref Range   BP 103/52 mmHg  Surgical PCR screen     Status: None   Collection Time: 01/12/19  1:15 PM   Specimen: Nasal Mucosa; Nasal Swab  Result Value Ref Range   MRSA, PCR NEGATIVE NEGATIVE   Staphylococcus aureus NEGATIVE NEGATIVE    Comment: (NOTE) The Xpert SA Assay (FDA approved for NASAL specimens in patients 5 years of age and older), is one component of a comprehensive surveillance program. It is not intended to diagnose infection nor to guide or monitor treatment. Performed at [facility],  [place] [address] [place], [place] [address]     Assessment/Plan: 1. CHB (complete heart block) ([place]) Pacemaker in place. Heart RRR. Vitals stable overall. BP mildly elevated. Asymptomatic. Will have her monitor at home until follow-up with Cardiology. Repeat labs today.  - Basic metabolic panel - Magnesium   Leeanne Rio, PA-C

## 2019-01-17 NOTE — Patient Instructions (Signed)
Please go to the lab today for blood work.  I will call you with your results. We will alter treatment regimen(s) if indicated by your results.   Please follow-up with Cardiology as scheduled.  I am glad you are doing so well!  Hang in there!

## 2019-01-19 ENCOUNTER — Encounter: Payer: Self-pay | Admitting: Physician Assistant

## 2019-01-19 DIAGNOSIS — M542 Cervicalgia: Secondary | ICD-10-CM

## 2019-01-19 MED ORDER — TRAMADOL HCL 50 MG PO TABS: 50.0000 mg | ORAL_TABLET | Freq: Four times a day (QID) | ORAL | 0 refills | Status: DC | PRN

## 2019-01-19 NOTE — Telephone Encounter (Signed)
Last rx for Tramadol 12/22/18 #120 LOV: 01/17/19

## 2019-01-26 ENCOUNTER — Ambulatory Visit (INDEPENDENT_AMBULATORY_CARE_PROVIDER_SITE_OTHER): Payer: Medicare HMO | Admitting: *Deleted

## 2019-01-26 ENCOUNTER — Other Ambulatory Visit: Payer: Self-pay

## 2019-01-26 DIAGNOSIS — I442 Atrioventricular block, complete: Secondary | ICD-10-CM

## 2019-01-26 LAB — CUP PACEART INCLINIC DEVICE CHECK
Battery Remaining Longevity: 137 mo
Battery Voltage: 3.22 V
Brady Statistic AP VP Percent: 10.15 %
Brady Statistic AP VS Percent: 0 %
Brady Statistic AS VP Percent: 88.81 %
Brady Statistic AS VS Percent: 1.04 %
Brady Statistic RA Percent Paced: 10.37 %
Brady Statistic RV Percent Paced: 98.96 %
Date Time Interrogation Session: 20201022114655
Implantable Lead Implant Date: 20201008
Implantable Lead Implant Date: 20201008
Implantable Lead Location: 753859
Implantable Lead Location: 753860
Implantable Lead Model: 5076
Implantable Lead Model: 5076
Implantable Pulse Generator Implant Date: 20201008
Lead Channel Impedance Value: 285 Ohm
Lead Channel Impedance Value: 361 Ohm
Lead Channel Impedance Value: 494 Ohm
Lead Channel Impedance Value: 494 Ohm
Lead Channel Pacing Threshold Amplitude: 0.75 V
Lead Channel Pacing Threshold Amplitude: 0.75 V
Lead Channel Pacing Threshold Pulse Width: 0.4 ms
Lead Channel Pacing Threshold Pulse Width: 0.4 ms
Lead Channel Sensing Intrinsic Amplitude: 0.625 mV
Lead Channel Sensing Intrinsic Amplitude: 16.25 mV
Lead Channel Setting Pacing Amplitude: 3.5 V
Lead Channel Setting Pacing Amplitude: 3.5 V
Lead Channel Setting Pacing Pulse Width: 0.4 ms
Lead Channel Setting Sensing Sensitivity: 5.6 mV

## 2019-01-26 NOTE — Progress Notes (Signed)
Wound check appointment. Steri-strips removed. Wound without redness or edema. Incision edges approximated, wound well healed. Normal device function. Thresholds, sensing, and impedances consistent with implant measurements. Device programmed at 3.5V/auto capture programmed on for extra safety margin until 3 month visit. Histogram distribution appropriate for patient and level of activity. No mode switches or high ventricular rates noted. Patient educated about wound care, arm mobility, lifting restrictions. ROV in 3 months with Dr Rayann Heman on 04/17/19. remote transmission scheduled for 07/19/19.

## 2019-01-26 NOTE — Patient Instructions (Signed)
Call office if you develop swelling, redness, or drainage from the wound site.

## 2019-02-10 ENCOUNTER — Encounter: Payer: Self-pay | Admitting: Physician Assistant

## 2019-02-10 DIAGNOSIS — I10 Essential (primary) hypertension: Secondary | ICD-10-CM

## 2019-02-10 MED ORDER — LISINOPRIL 10 MG PO TABS: 10.0000 mg | ORAL_TABLET | Freq: Two times a day (BID) | ORAL | 1 refills | Status: DC

## 2019-02-10 MED ORDER — CARVEDILOL 12.5 MG PO TABS: 12.5000 mg | ORAL_TABLET | Freq: Two times a day (BID) | ORAL | 1 refills | Status: DC

## 2019-02-22 ENCOUNTER — Encounter: Payer: Self-pay | Admitting: Physician Assistant

## 2019-02-22 DIAGNOSIS — M542 Cervicalgia: Secondary | ICD-10-CM

## 2019-02-22 MED ORDER — TRAMADOL HCL 50 MG PO TABS: 50.0000 mg | ORAL_TABLET | Freq: Four times a day (QID) | ORAL | 0 refills | Status: DC | PRN

## 2019-02-22 NOTE — Addendum Note (Signed)
Addended by: Brunetta Jeans on: 02/22/2019 05:07 PM   Modules accepted: Orders

## 2019-02-22 NOTE — Telephone Encounter (Signed)
Tramadol last rx #120 01/19/19 LOV: 01/17/19

## 2019-03-03 DIAGNOSIS — Z03818 Encounter for observation for suspected exposure to other biological agents ruled out: Secondary | ICD-10-CM | POA: Diagnosis not present

## 2019-03-03 DIAGNOSIS — Z20828 Contact with and (suspected) exposure to other viral communicable diseases: Secondary | ICD-10-CM | POA: Diagnosis not present

## 2019-03-08 ENCOUNTER — Emergency Department (HOSPITAL_COMMUNITY): Payer: Medicare HMO

## 2019-03-08 ENCOUNTER — Emergency Department (HOSPITAL_COMMUNITY)
Admission: EM | Admit: 2019-03-08 | Discharge: 2019-03-08 | Disposition: A | Discharge status: Home / Self Care | Payer: Medicare HMO | Attending: Emergency Medicine | Admitting: Emergency Medicine

## 2019-03-08 ENCOUNTER — Other Ambulatory Visit: Payer: Self-pay

## 2019-03-08 ENCOUNTER — Encounter (HOSPITAL_COMMUNITY): Payer: Self-pay | Admitting: Emergency Medicine

## 2019-03-08 DIAGNOSIS — I10 Essential (primary) hypertension: Secondary | ICD-10-CM | POA: Insufficient documentation

## 2019-03-08 DIAGNOSIS — Z950 Presence of cardiac pacemaker: Secondary | ICD-10-CM | POA: Insufficient documentation

## 2019-03-08 DIAGNOSIS — M546 Pain in thoracic spine: Secondary | ICD-10-CM | POA: Diagnosis not present

## 2019-03-08 DIAGNOSIS — W19XXXA Unspecified fall, initial encounter: Secondary | ICD-10-CM

## 2019-03-08 DIAGNOSIS — M4324 Fusion of spine, thoracic region: Secondary | ICD-10-CM | POA: Diagnosis not present

## 2019-03-08 DIAGNOSIS — Z7982 Long term (current) use of aspirin: Secondary | ICD-10-CM | POA: Diagnosis not present

## 2019-03-08 DIAGNOSIS — R52 Pain, unspecified: Secondary | ICD-10-CM

## 2019-03-08 DIAGNOSIS — Z79899 Other long term (current) drug therapy: Secondary | ICD-10-CM | POA: Diagnosis not present

## 2019-03-08 DIAGNOSIS — M25511 Pain in right shoulder: Secondary | ICD-10-CM | POA: Diagnosis not present

## 2019-03-08 DIAGNOSIS — M791 Myalgia, unspecified site: Secondary | ICD-10-CM | POA: Diagnosis not present

## 2019-03-08 DIAGNOSIS — M7918 Myalgia, other site: Secondary | ICD-10-CM

## 2019-03-08 DIAGNOSIS — M542 Cervicalgia: Secondary | ICD-10-CM | POA: Diagnosis not present

## 2019-03-08 DIAGNOSIS — I959 Hypotension, unspecified: Secondary | ICD-10-CM | POA: Diagnosis not present

## 2019-03-08 DIAGNOSIS — R079 Chest pain, unspecified: Secondary | ICD-10-CM | POA: Diagnosis not present

## 2019-03-08 MED ORDER — METHOCARBAMOL 500 MG PO TABS
500.0000 mg | ORAL_TABLET | Freq: Once | ORAL | Status: AC
  Administered 2019-03-08: 500 mg via ORAL
  Filled 2019-03-08: qty 1

## 2019-03-08 MED ORDER — METHOCARBAMOL 500 MG PO TABS: 500.0000 mg | ORAL_TABLET | Freq: Two times a day (BID) | ORAL | 0 refills | Status: DC

## 2019-03-08 NOTE — ED Notes (Signed)
Patient family member is coming to pick patient up. RN was able to get in touch with family member.

## 2019-03-08 NOTE — ED Provider Notes (Signed)
[facility] DEPT Provider Note   CSN: [id number] Arrival date & time: 03/08/19  0321     History   Chief Complaint Chief Complaint  Patient presents with  . Neck Pain    HPI Bethany Carpenter is a 85 y.o. female.      Neck Pain Pain location:  R side Quality:  Aching Pain radiates to:  R shoulder and R scapula Pain severity:  Mild Duration:  3 days Timing:  Constant Chronicity:  New Context comment:  After waking up Relieved by: tramadol seems to help. Worsened by:  Position Associated symptoms: no bladder incontinence, no chest pain, no leg pain and no photophobia     Past Medical History:  Diagnosis Date  . Allergy   . Arthritis   . Cancer (HCC)    Skin  . Colon polyps   . Heart disease   . History of chickenpox   . Hyperlipidemia   . Hypertension   . Presence of permanent cardiac pacemaker 01/12/2019    Patient Active Problem List   Diagnosis Date Noted  . CHB (complete heart block) ([place]) 01/12/2019  . Essential hypertension 05/12/2017  . Hyperlipidemia 05/12/2017  . Chronic neck pain 05/12/2017  . Chronic low back pain without sciatica 05/12/2017  . Seasonal allergic rhinitis 05/12/2017  . Skin cancer 09/16/2016    Past Surgical History:  Procedure Laterality Date  . ANGIOPLASTY  03/08/2007  . BREAST BIOPSY  09/28/2003  . CATARACT EXTRACTION  11/25/2005   04/07/2006  . CERVICAL SPINE SURGERY  11/24/2012   02/02/2008  . INSERT / REPLACE / REMOVE PACEMAKER  01/12/2019  . PACEMAKER IMPLANT N/A 01/12/2019   Procedure: PACEMAKER IMPLANT;  Surgeon: Thompson Grayer, MD;  Location: [facility];  Service: Cardiovascular;  Laterality: N/A;  . TONSILLECTOMY  1952  . TUBAL LIGATION  1970     OB History   No obstetric history on file.      Home Medications    Prior to Admission medications   Medication Sig Start Date End Date Taking? Authorizing Provider  aspirin 81 MG EC tablet Take 81 mg by mouth daily.    Yes  [provider]  carvedilol (COREG) 12.5 MG tablet Take 1 tablet (12.5 mg total) by mouth 2 (two) times daily with a meal. 02/10/19  Yes Brunetta Jeans, PA-C  fluticasone [facility]) 50 MCG/ACT nasal spray Place 1 spray into both nostrils daily.   Yes [provider]  ketotifen (ALAWAY) 0.025 % ophthalmic solution Place 1 drop into both eyes daily.   Yes [provider]  lisinopril (ZESTRIL) 10 MG tablet Take 1 tablet (10 mg total) by mouth 2 (two) times daily. 02/10/19  Yes Brunetta Jeans, PA-C  simvastatin (ZOCOR) 40 MG tablet TAKE 1 TABLET DAILY Patient taking differently: Take 40 mg by mouth daily.  12/26/18  Yes Brunetta Jeans, PA-C  traMADol (ULTRAM) 50 MG tablet Take 1 tablet (50 mg total) by mouth every 6 (six) hours as needed. Patient taking differently: Take 50 mg by mouth every 6 (six) hours as needed for moderate pain.  02/22/19  Yes Brunetta Jeans, PA-C  methocarbamol (ROBAXIN) 500 MG tablet Take 1 tablet (500 mg total) by mouth 2 (two) times daily. 03/08/19   Windy Dudek, Corene Cornea, MD    Family History Family History  Problem Relation Age of Onset  . Hearing loss Mother   . Hypertension Mother   . Osteoporosis Mother   . Arthritis Mother   .  Cancer Father        Skin  . Hearing loss Father   . Arthritis Father   . Diabetes Brother   . Stroke Brother   . Early death Brother   . Depression Daughter   . Early death Son   . Early death Maternal Grandfather   . Early death Paternal Grandfather     Social History Social History   Tobacco Use  . Smoking status: Never Smoker  . Smokeless tobacco: Never Used  Substance Use Topics  . Alcohol use: No    Frequency: Never  . Drug use: No     Allergies   Other, Nickel, Penicillins, and Sulfa antibiotics   Review of Systems Review of Systems  Eyes: Negative for photophobia.  Cardiovascular: Negative for chest pain.  Genitourinary: Negative for bladder incontinence.  Musculoskeletal:  Positive for neck pain.  All other systems reviewed and are negative.    Physical Exam Updated Vital Signs BP 136/77   Pulse 81   Temp 97.7 F (36.5 C) (Oral)   Resp (!) 21   Ht 5\' 1"  (1.549 m)   Wt 49 kg   SpO2 97%   BMI 20.41 kg/m   Physical Exam Vitals signs and nursing note reviewed.  Constitutional:      Appearance: She is well-developed.  HENT:     Head: Normocephalic and atraumatic.     Mouth/Throat:     Mouth: Mucous membranes are dry.     Pharynx: Oropharynx is clear.  Eyes:     Conjunctiva/sclera: Conjunctivae normal.  Neck:     Musculoskeletal: Normal range of motion.  Cardiovascular:     Rate and Rhythm: Normal rate and regular rhythm.  Pulmonary:     Effort: No respiratory distress.     Breath sounds: No stridor.  Abdominal:     General: There is no distension.  Musculoskeletal: Normal range of motion.        General: Tenderness (right paracervical and paraspinal muscles near upper thoracic spine) present.  Skin:    General: Skin is warm and dry.  Neurological:     Mental Status: She is alert.      ED Treatments / Results  Labs (all labs ordered are listed, but only abnormal results are displayed) Labs Reviewed - No data to display  EKG None  Radiology Dg Chest 1 View  Result Date: 03/08/2019 CLINICAL DATA:  Chest pain. EXAM: CHEST  1 VIEW COMPARISON:  01/13/2019 FINDINGS: The pacer wires are stable. The cardiac silhouette, mediastinal and hilar contours are stable. Hyperinflation and emphysematous changes. There is a small right pleural effusion with overlying atelectasis. No definite infiltrates or pulmonary edema. No worrisome pulmonary lesions. The bony thorax is intact. IMPRESSION: Small right pleural effusion with overlying atelectasis. Stable underlying emphysematous changes. Electronically Signed   By: Marijo Sanes M.D.   On: 03/08/2019 06:28   Dg Cervical Spine Complete  Result Date: 03/08/2019 CLINICAL DATA:  Increased neck pain  over the last 3 days. Chronic neck pain. Fracture and spine surgery 6 years ago. EXAM: CERVICAL SPINE - COMPLETE 4+ VIEW COMPARISON:  Cervical spine radiographs 01/04/2018 FINDINGS: Main cervical spine is fused from C2 through T3. Hardware stable. No acute or healing fractures are present. The prevertebral soft tissues are normal. Lung apices are clear. IMPRESSION: 1. No acute or healing fractures. 2. Stable appearance of C2-T3 fusion and hardware. Electronically Signed   By: San Morelle M.D.   On: 03/08/2019 06:18   Dg Thoracic  Spine 2 View  Result Date: 03/08/2019 CLINICAL DATA:  Neck and upper thoracic spine pain. Neck fracture or surgery 6 years ago. EXAM: THORACIC SPINE 2 VIEWS COMPARISON:  Two-view chest x-ray 01/13/2019 FINDINGS: Twelve rib-bearing thoracic type vertebral bodies are present. Cervicothoracic fusion is present. Screws are present in the pedicle and lateral mass of T1, T2, and T3. Construct is stable. Vertebral body heights are maintained. Visualized lungs are clear. Pacing wires are stable. IMPRESSION: 1. Stable appearance of cervicothoracic fusion hardware without radiographic evidence for complication. 2. No acute abnormality. Electronically Signed   By: San Morelle M.D.   On: 03/08/2019 06:17    Procedures Procedures (including critical care time)  Medications Ordered in ED Medications  methocarbamol (ROBAXIN) tablet 500 mg (500 mg Oral Given 03/08/19 0608)     Initial Impression / Assessment and Plan / ED Course  I have reviewed the triage vital signs and the nursing notes.  Pertinent labs & imaging results that were available during my care of the patient were reviewed by me and considered in my medical decision making (see chart for details).   Seems muscular. Will tx w/ robaxin and check xr's.  Tests negative. Will dc w/ robaxin, suspect muscular cause, pcp follow up if not improving for further management.   Final Clinical Impressions(s) / ED  Diagnoses   Final diagnoses:  Neck pain  Musculoskeletal pain    ED Discharge Orders         Ordered    methocarbamol (ROBAXIN) 500 MG tablet  2 times daily     03/08/19 0641           Louana Fontenot, Corene Cornea, MD 03/09/19 0007

## 2019-03-08 NOTE — ED Notes (Signed)
Nurse Secretary is Medical sales representative and patient with attaining a [employer] for transport. RN attempted to call family member but there was no response.

## 2019-03-08 NOTE — ED Notes (Addendum)
Patient is from [employer] [facility].

## 2019-03-08 NOTE — ED Triage Notes (Signed)
Patient comes from independent living at [facility]. Patient is complaining of chronic neck and back pain. This started three days ago.

## 2019-03-08 NOTE — ED Notes (Signed)
[facility] has been called for patient transport back to [facility].

## 2019-03-15 ENCOUNTER — Ambulatory Visit (INDEPENDENT_AMBULATORY_CARE_PROVIDER_SITE_OTHER): Payer: Medicare HMO | Admitting: Physician Assistant

## 2019-03-15 ENCOUNTER — Encounter: Payer: Self-pay | Admitting: Physician Assistant

## 2019-03-15 ENCOUNTER — Other Ambulatory Visit: Payer: Self-pay

## 2019-03-15 VITALS — BP 150/80 | HR 71 | Temp 98.7°F | Resp 14 | Ht 61.0 in | Wt 107.0 lb

## 2019-03-15 DIAGNOSIS — M542 Cervicalgia: Secondary | ICD-10-CM | POA: Diagnosis not present

## 2019-03-15 DIAGNOSIS — M62838 Other muscle spasm: Secondary | ICD-10-CM | POA: Diagnosis not present

## 2019-03-15 MED ORDER — METHOCARBAMOL 500 MG PO TABS: 250.0000 mg | ORAL_TABLET | Freq: Every evening | ORAL | 0 refills | Status: DC | PRN

## 2019-03-15 NOTE — Progress Notes (Signed)
Patient presents to clinic today to discuss ongoing issue with neck pain. 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 Patient is s/p fusion of C2-T3. Has been managed well on Tramadol every 6 hours as needed. Some breakthrough pain recently. Denies trauma or injury. Had evaluation in ER on 03/08/2019 for this. Imaging unremarkable. Felt exacerbation of symptoms was related to muscular tension/spasm. Was started on Robaxin in the evenings. Has taken as directed and noted a vast improvement in symptoms and her quality of life. Denies lightheadedness, dizziness or excessive fatigue with medication. Is wondering if this is something she can continue and/or what other options are available to her.    Past Medical History:  Diagnosis Date  . Allergy   . Arthritis   . Cancer (HCC)    Skin  . Colon polyps   . Heart disease   . History of chickenpox   . Hyperlipidemia   . Hypertension   . Presence of permanent cardiac pacemaker 01/12/2019    Current Outpatient Medications on File Prior to Visit  Medication Sig Dispense Refill  . aspirin 81 MG EC tablet Take 81 mg by mouth daily.     . carvedilol (COREG) 12.5 MG tablet Take 1 tablet (12.5 mg total) by mouth 2 (two) times daily with a meal. 180 tablet 1  . fluticasone (FLONASE) 50 MCG/ACT nasal spray Place 1 spray into both nostrils daily.    Marland Kitchen ketotifen (ALAWAY) 0.025 % ophthalmic solution Place 1 drop into both eyes daily.    Marland Kitchen lisinopril (ZESTRIL) 10 MG tablet Take 1 tablet (10 mg total) by mouth 2 (two) times daily. 180 tablet 1  . methocarbamol (ROBAXIN) 500 MG tablet Take 1 tablet (500 mg total) by mouth 2 (two) times daily. 20 tablet 0  . simvastatin (ZOCOR) 40 MG tablet TAKE 1 TABLET DAILY (Patient taking differently: Take 40 mg by mouth daily. ) 90 tablet 1  . traMADol (ULTRAM) 50 MG tablet Take 1 tablet (50 mg total) by mouth every 6 (six) hours as needed. (Patient taking differently:  Take 50 mg by mouth every 6 (six) hours as needed for moderate pain. ) 120 tablet 0   No current facility-administered medications on file prior to visit.     Allergies  Allergen Reactions  . Other Hives and Itching    Fragrances  . Nickel   . Penicillins Rash    Did it involve swelling of the face/tongue/throat, SOB, or low BP? No Did it involve sudden or severe rash/hives, skin peeling, or any reaction on the inside of your mouth or nose? Yes Did you need to seek medical attention at a hospital or doctor's office? Yes When did it last happen?years ago  If all above answers are "NO", may proceed with cephalosporin use.   . Sulfa Antibiotics Rash    Family History  Problem Relation Age of Onset  . Hearing loss Mother   . Hypertension Mother   . Osteoporosis Mother   . Arthritis Mother   . Cancer Father        Skin  . Hearing loss Father   . Arthritis Father   . Diabetes Brother   . Stroke Brother   . Early death Brother   . Depression Daughter   . Early death Son   . Early death Maternal Grandfather   . Early death Paternal Grandfather     Social History   Socioeconomic History  . Marital  status: Divorced    Spouse name: Not on file  . Number of children: Not on file  . Years of education: Not on file  . Highest education level: Not on file  Occupational History  . Occupation: Retired  Scientific laboratory technician  . Financial resource strain: Not on file  . Food insecurity    Worry: Not on file    Inability: Not on file  . Transportation needs    Medical: Not on file    Non-medical: Not on file  Tobacco Use  . Smoking status: Never Smoker  . Smokeless tobacco: Never Used  Substance and Sexual Activity  . Alcohol use: No    Frequency: Never  . Drug use: No  . Sexual activity: Not Currently  Lifestyle  . Physical activity    Days per week: Not on file    Minutes per session: Not on file  . Stress: Not on file  Relationships  . Social Herbalist  on phone: Not on file    Gets together: Not on file    Attends religious service: Not on file    Active member of club or organization: Not on file    Attends meetings of clubs or organizations: Not on file    Relationship status: Not on file  Other Topics Concern  . Not on file  Social History Narrative  . Not on file    Review of Systems - See HPI.  All other ROS are negative.  Wt 107 lb (48.5 kg)   BMI 20.22 kg/m   Physical Exam Vitals reviewed.  Constitutional:      Appearance: Normal appearance.  HENT:     Head: Normocephalic and atraumatic.  Cardiovascular:     Rate and Rhythm: Normal rate and regular rhythm.  Pulmonary:     Effort: Pulmonary effort is normal.  Musculoskeletal:     Cervical back: Neck supple. No rigidity. Muscular tenderness present. No spinous process tenderness. Normal range of motion.  Neurological:     General: No focal deficit present.     Mental Status: She is alert and oriented to person, place, and time.     Motor: No weakness.  Psychiatric:        Mood and Affect: Mood normal.     Recent Results (from the past 2160 hour(s))  CBC     Status: Abnormal   Collection Time: 01/10/19 12:35 PM  Result Value Ref Range   WBC 6.1 4.0 - 10.5 K/uL   RBC 3.97 3.87 - 5.11 Mil/uL   Platelets 176.0 150.0 - 400.0 K/uL   Hemoglobin 11.6 (L) 12.0 - 15.0 g/dL   HCT 35.4 (L) 36.0 - 46.0 %   MCV 89.3 78.0 - 100.0 fl   MCHC 32.7 30.0 - 36.0 g/dL   RDW 14.5 11.5 - [id number] %  Basic metabolic panel     Status: Abnormal   Collection Time: 01/10/19 12:35 PM  Result Value Ref Range   Sodium 135 135 - 145 mEq/L   Potassium 4.7 3.5 - 5.1 mEq/L   Chloride 102 96 - 112 mEq/L   CO2 26 19 - 32 mEq/L   Glucose, Bld 108 (H) 70 - 99 mg/dL   BUN 42 (H) 6 - 23 mg/dL   Creatinine, Ser 1.29 (H) 0.40 - 1.20 mg/dL   Calcium 9.6 8.4 - 10.5 mg/dL   GFR 39.48 (L) >60.00 mL/min  TSH     Status: None   Collection Time: 01/10/19 12:35 PM  Result Value Ref Range   TSH 2.36  0.35 - 4.50 uIU/mL  Basic metabolic panel     Status: Abnormal   Collection Time: 01/12/19 11:23 AM  Result Value Ref Range   Sodium 135 135 - 145 mmol/L   Potassium 4.8 3.5 - 5.1 mmol/L   Chloride 103 98 - 111 mmol/L   CO2 22 22 - 32 mmol/L   Glucose, Bld 132 (H) 70 - 99 mg/dL   BUN 38 (H) 8 - 23 mg/dL   Creatinine, Ser 1.08 (H) 0.44 - 1.00 mg/dL   Calcium 9.1 8.9 - 10.3 mg/dL   GFR calc non Af Amer 48 (L) >60 mL/min   GFR calc Af Amer 55 (L) >60 mL/min   Anion gap 10 5 - 15    Comment: Performed at [facility], [place] [address] [place], [place] [address]  Magnesium     Status: Abnormal   Collection Time: 01/12/19 11:23 AM  Result Value Ref Range   Magnesium 2.7 (H) 1.7 - 2.4 mg/dL    Comment: Performed at [facility] [address] [place], [place] [address]  CBC     Status: Abnormal   Collection Time: 01/12/19 11:23 AM  Result Value Ref Range   WBC 7.0 4.0 - 10.5 K/uL   RBC 3.88 3.87 - 5.11 MIL/uL   Hemoglobin 11.6 (L) 12.0 - 15.0 g/dL   HCT 35.6 (L) 36.0 - 46.0 %   MCV 91.8 80.0 - 100.0 fL   MCH 29.9 26.0 - 34.0 pg   MCHC 32.6 30.0 - 36.0 g/dL   RDW 13.8 11.5 - 15.5 %   Platelets 181 150 - 400 K/uL   nRBC 0.0 0.0 - 0.2 %    Comment: Performed at [facility], [place] [address], [place], [place] [address]  Protime-INR (if pt is taking coumadin)     Status: None   Collection Time: 01/12/19 11:23 AM  Result Value Ref Range   Prothrombin Time 12.9 11.4 - 15.2 seconds   INR 1.0 0.8 - 1.2    Comment: (NOTE) INR goal varies based on device and disease states. Performed at [facility], [place] [address], [place], [place] [address]   APTT  (IF patient is taking Pradaxa)     Status: None   Collection Time: 01/12/19 11:23 AM  Result Value Ref Range   aPTT 25 24 - 36 seconds    Comment: Performed at [facility] [address] [place], [place] [address]  SARS Coronavirus 2 [facility] order, Performed in [facility] hospital lab) Nasopharyngeal  Nasopharyngeal Swab     Status: None   Collection Time: 01/12/19 11:23 AM   Specimen: Nasopharyngeal Swab  Result Value Ref Range   SARS Coronavirus 2 NEGATIVE NEGATIVE    Comment: (NOTE) If result is NEGATIVE SARS-CoV-2 target nucleic acids are NOT DETECTED. The SARS-CoV-2 RNA is generally detectable in upper and lower  respiratory specimens during the acute phase of infection. The lowest  concentration of SARS-CoV-2 viral copies this assay can detect is 250  copies / mL. A negative result does not preclude SARS-CoV-2 infection  and should not be used as the sole basis for treatment or other  patient management decisions.  A negative result may occur with  improper specimen collection / handling, submission of specimen other  than nasopharyngeal swab, presence of viral mutation(s) within the  areas targeted by this assay, and inadequate number of viral copies  (<  250 copies / mL). A negative result must be combined with clinical  observations, patient history, and epidemiological information. If result is POSITIVE SARS-CoV-2 target nucleic acids are DETECTED. The SARS-CoV-2 RNA is generally detectable in upper and lower  respiratory specimens dur ing the acute phase of infection.  Positive  results are indicative of active infection with SARS-CoV-2.  Clinical  correlation with patient history and other diagnostic information is  necessary to determine patient infection status.  Positive results do  not rule out bacterial infection or co-infection with other viruses. If result is PRESUMPTIVE POSTIVE SARS-CoV-2 nucleic acids MAY BE PRESENT.   A presumptive positive result was obtained on the submitted specimen  and confirmed on repeat testing.  While 2019 novel coronavirus  (SARS-CoV-2) nucleic acids may be present in the submitted sample  additional confirmatory testing may be necessary for epidemiological  and / or clinical management purposes  to differentiate between  SARS-CoV-2  and other Sarbecovirus currently known to infect humans.  If clinically indicated additional testing with an alternate test  methodology [phone]) is advised. The SARS-CoV-2 RNA is generally  detectable in upper and lower respiratory sp ecimens during the acute  phase of infection. The expected result is Negative. Fact Sheet for Patients:  StrictlyIdeas.no Fact Sheet for Healthcare Providers: BankingDealers.co.za This test is not yet approved or cleared by the [place] FDA and has been authorized for detection and/or diagnosis of SARS-CoV-2 by FDA under an Emergency Use Authorization (EUA).  This EUA will remain in effect (meaning this test can be used) for the duration of the COVID-19 declaration under Section 564(b)(1) of the Act, 21 U.S.C. section 360bbb-3(b)(1), unless the authorization is terminated or revoked sooner. Performed at [facility], [place] [address] [place], [place] [address]   ECHOCARDIOGRAM LIMITED     Status: None   Collection Time: 01/12/19 12:39 PM  Result Value Ref Range   BP 103/52 mmHg  Surgical PCR screen     Status: None   Collection Time: 01/12/19  1:15 PM   Specimen: Nasal Mucosa; Nasal Swab  Result Value Ref Range   MRSA, PCR NEGATIVE NEGATIVE   Staphylococcus aureus NEGATIVE NEGATIVE    Comment: (NOTE) The Xpert SA Assay (FDA approved for NASAL specimens in patients 57 years of age and older), is one component of a comprehensive surveillance program. It is not intended to diagnose infection nor to guide or monitor treatment. Performed at [facility], [place] [address], [place], [place] [id number]   Basic metabolic panel     Status: Abnormal   Collection Time: 01/17/19 11:14 AM  Result Value Ref Range   Sodium 137 135 - 145 mEq/L   Potassium 4.5 3.5 - 5.1 mEq/L   Chloride 104 96 - 112 mEq/L   CO2 28 19 - 32 mEq/L   Glucose, Bld 102 (H) 70 - 99 mg/dL   BUN 21 6 - 23 mg/dL    Creatinine, Ser 0.78 0.40 - 1.20 mg/dL   Calcium 8.8 8.4 - 10.5 mg/dL   GFR 70.55 >60.00 mL/min  Magnesium     Status: None   Collection Time: 01/17/19 11:14 AM  Result Value Ref Range   Magnesium 2.3 1.5 - 2.5 mg/dL  CUP PACEART INCLINIC DEVICE CHECK     Status: None   Collection Time: 01/26/19  3:20 PM  Result Value Ref Range   Date Time Interrogation Session [id number]    Pulse Generator Manufacturer MERM    Pulse Gen Model  [id number] Azure XT DR MRI    Pulse Gen Serial Number [id number]    Clinic Name [facility] Pulse Generator Type Implantable Pulse Generator    Implantable Pulse Generator Implant Date GX:4201428    Implantable Lead Manufacturer MERM    Implantable Lead Model 5076 CapSureFix Novus MRI SureScan    Implantable Lead Serial Number [id number]    Implantable Lead Implant Date GX:4201428    Implantable Lead Location Detail 1 APPENDAGE    Implantable Lead Location [id number]    Implantable Lead Manufacturer MERM    Implantable Lead Model 5076 CapSureFix Novus MRI SureScan    Implantable Lead Serial Number [id number]    Implantable Lead Implant Date GX:4201428    Implantable Lead Location Detail 1 APEX    Implantable Lead Location [phone]    Lead Channel Setting Sensing Sensitivity 5.6 mV   Lead Channel Setting Pacing Amplitude 3.5 V   Lead Channel Setting Pacing Pulse Width 0.4 ms   Lead Channel Setting Pacing Amplitude 3.5 V   Lead Channel Impedance Value 494 ohm   Lead Channel Impedance Value 285 ohm   Lead Channel Sensing Intrinsic Amplitude 0.625 mV   Lead Channel Pacing Threshold Amplitude 0.75 V   Lead Channel Pacing Threshold Pulse Width 0.4 ms   Lead Channel Impedance Value 494 ohm   Lead Channel Impedance Value 361 ohm   Lead Channel Sensing Intrinsic Amplitude 16.25 mV   Lead Channel Pacing Threshold Amplitude 0.75 V   Lead Channel Pacing Threshold Pulse Width 0.4 ms   Battery Status OK    Battery Remaining Longevity 137 mo   Battery  Voltage 3.22 V   Brady Statistic RA Percent Paced 10.37 %   Brady Statistic RV Percent Paced 98.96 %   Brady Statistic AP VP Percent 10.15 %   Brady Statistic AS VP Percent 88.81 %   Brady Statistic AP VS Percent 0 %   Brady Statistic AS VS Percent 1.04 %   Eval Rhythm VP @ 30    Assessment/Plan: 1. Cervicalgia 2. Trapezius muscle spasm Ongoing cervicalgia. Recent flare of pain related to tension and spasm in trapezius. On further discussion she has been under a lot of stress recently which both she and I feel is a contributing factor. Have to be very careful with muscle relaxants. Discussed this in detail with patient. Want her to work on use of heating pads or topical OTC voltaren to the neck. Recommend massage therapy. Stress-relief tactics reviewed. Will allow  Robaxin at 250 mg dose to use in evenings only if needed for more significant tension as her benefit from the medication and impact on QOL are quite significant. The goal with other treatment modalities is to cut down on need for the Robaxin. Close follow-up scheduled.    Leeanne Rio, PA-C

## 2019-03-15 NOTE — Patient Instructions (Signed)
Please reduce the Methocarbamol to 1/2 tablet max in the evening. Only use when needed. Continue the Tramadol as directed.   Get some over-the-counter Voltaren gel to apply to the neck and shoulders twice daily. This should help cut down on need for the methocarbamol.  Consider getting some massages.  I think stress will be better once you get moved.  Let's follow-up in 3-4 weeks via video to see how things are going. Sooner if needed.

## 2019-03-23 ENCOUNTER — Encounter: Payer: Self-pay | Admitting: Physician Assistant

## 2019-03-23 DIAGNOSIS — M542 Cervicalgia: Secondary | ICD-10-CM

## 2019-03-23 MED ORDER — TRAMADOL HCL 50 MG PO TABS: 50.0000 mg | ORAL_TABLET | Freq: Four times a day (QID) | ORAL | 0 refills | Status: DC | PRN

## 2019-03-23 NOTE — Telephone Encounter (Signed)
Tramadol last rx 02/22/19 #120 LOV:03/15/19 Cervicalgia CSC: 11/16/17

## 2019-04-01 ENCOUNTER — Encounter: Payer: Self-pay | Admitting: Physician Assistant

## 2019-04-05 ENCOUNTER — Encounter: Payer: Self-pay | Admitting: Physician Assistant

## 2019-04-05 ENCOUNTER — Other Ambulatory Visit: Payer: Self-pay

## 2019-04-05 ENCOUNTER — Ambulatory Visit (INDEPENDENT_AMBULATORY_CARE_PROVIDER_SITE_OTHER): Payer: Medicare HMO | Admitting: Physician Assistant

## 2019-04-05 DIAGNOSIS — M542 Cervicalgia: Secondary | ICD-10-CM

## 2019-04-05 DIAGNOSIS — G8929 Other chronic pain: Secondary | ICD-10-CM

## 2019-04-05 NOTE — Progress Notes (Signed)
I have discussed the procedure for the virtual visit with the patient who has given consent to proceed with assessment and treatment.   Hendrix Yurkovich S Michella Detjen, CMA     

## 2019-04-05 NOTE — Progress Notes (Signed)
Virtual Visit via Telephone Note  I connected with Bethany Carpenter on 04/05/19 at 10:00 AM EST by telephone and verified that I am speaking with the correct person using two identifiers.  Location: Patient: Home Provider: [place] [facility] at [facility]   I discussed the limitations, risks, security and privacy concerns of performing an evaluation and management service by telephone and the availability of in person appointments. I also discussed with the patient that there may be a patient responsible charge related to this service. The patient expressed understanding and agreed to proceed.   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  At last visit she had noted she was taking Robaxin at night which was given to her from the ER provider.  This was decreased to 250 mg nightly as needed in for neck pain only.  Recommended topical Voltaren gel.  Sleep hygiene reviewed.  Since last visit, patient notes she has been sleeping well.  Has been able to cook relax her mind and fall asleep.  Is staying asleep.  Has not had to use the Robaxin at night.  Neck pain currently controlled with chronic medication regimen and heating pad at bedtime.  Notes overall she is doing very well.   Observations/Objective: No labored breathing.  Speech is clear and coherent with logical content.  Patient is alert and oriented at baseline.   Assessment and Plan: 1. Chronic neck pain Has returned to baseline.  Is sleeping well without any medication.  Continue sleep hygiene practices and chronic medication regimen.  Follow-up as needed for any worsening symptoms.  Otherwise she is to follow-up as scheduled.   Follow Up Instructions:  I discussed the assessment and treatment plan with the patient. The patient was provided an opportunity to ask questions and all were answered. The patient agreed with the plan  and demonstrated an understanding of the instructions.   The patient was advised to call back or seek an in-person evaluation if the symptoms worsen or if the condition fails to improve as anticipated.  I provided 5-10 minutes of non-face-to-face time during this encounter.   Leeanne Rio, PA-C

## 2019-04-06 ENCOUNTER — Encounter: Payer: Self-pay | Admitting: Physician Assistant

## 2019-04-09 ENCOUNTER — Encounter: Payer: Self-pay | Admitting: Physician Assistant

## 2019-04-09 DIAGNOSIS — M542 Cervicalgia: Secondary | ICD-10-CM

## 2019-04-10 MED ORDER — TRAMADOL HCL 50 MG PO TABS: 50.0000 mg | ORAL_TABLET | Freq: Four times a day (QID) | ORAL | 0 refills | Status: DC | PRN

## 2019-04-12 ENCOUNTER — Telehealth: Payer: Self-pay

## 2019-04-17 ENCOUNTER — Encounter: Payer: Medicare HMO | Admitting: Internal Medicine

## 2019-04-27 DIAGNOSIS — H1013 Acute atopic conjunctivitis, bilateral: Secondary | ICD-10-CM | POA: Diagnosis not present

## 2019-05-04 ENCOUNTER — Other Ambulatory Visit: Payer: Self-pay | Admitting: Physician Assistant

## 2019-05-04 DIAGNOSIS — M542 Cervicalgia: Secondary | ICD-10-CM

## 2019-05-05 ENCOUNTER — Encounter: Payer: Self-pay | Admitting: Physician Assistant

## 2019-05-08 ENCOUNTER — Ambulatory Visit (INDEPENDENT_AMBULATORY_CARE_PROVIDER_SITE_OTHER): Payer: Medicare HMO | Admitting: *Deleted

## 2019-05-08 DIAGNOSIS — I442 Atrioventricular block, complete: Secondary | ICD-10-CM

## 2019-05-08 LAB — CUP PACEART REMOTE DEVICE CHECK
Battery Remaining Longevity: 138 mo
Battery Voltage: 3.2 V
Brady Statistic AP VP Percent: 6.61 %
Brady Statistic AP VS Percent: 0 %
Brady Statistic AS VP Percent: 91.46 %
Brady Statistic AS VS Percent: 1.92 %
Brady Statistic RA Percent Paced: 6.99 %
Brady Statistic RV Percent Paced: 98.08 %
Date Time Interrogation Session: 20210131222122
Implantable Lead Implant Date: 20201008
Implantable Lead Implant Date: 20201008
Implantable Lead Location: 753859
Implantable Lead Location: 753860
Implantable Lead Model: 5076
Implantable Lead Model: 5076
Implantable Pulse Generator Implant Date: 20201008
Lead Channel Impedance Value: 266 Ohm
Lead Channel Impedance Value: 361 Ohm
Lead Channel Impedance Value: 456 Ohm
Lead Channel Impedance Value: 513 Ohm
Lead Channel Pacing Threshold Amplitude: 0.75 V
Lead Channel Pacing Threshold Amplitude: 0.75 V
Lead Channel Pacing Threshold Pulse Width: 0.4 ms
Lead Channel Pacing Threshold Pulse Width: 0.4 ms
Lead Channel Sensing Intrinsic Amplitude: 1 mV
Lead Channel Sensing Intrinsic Amplitude: 1 mV
Lead Channel Sensing Intrinsic Amplitude: 16.25 mV
Lead Channel Sensing Intrinsic Amplitude: 16.25 mV
Lead Channel Setting Pacing Amplitude: 1.5 V
Lead Channel Setting Pacing Amplitude: 2.5 V
Lead Channel Setting Pacing Pulse Width: 0.4 ms
Lead Channel Setting Sensing Sensitivity: 5.6 mV

## 2019-05-09 NOTE — Progress Notes (Signed)
PPM Remote  

## 2019-06-12 ENCOUNTER — Encounter: Payer: Self-pay | Admitting: Physician Assistant

## 2019-06-12 DIAGNOSIS — M542 Cervicalgia: Secondary | ICD-10-CM

## 2019-06-12 MED ORDER — TRAMADOL HCL 50 MG PO TABS: ORAL_TABLET | ORAL | 0 refills | Status: DC

## 2019-06-12 NOTE — Telephone Encounter (Signed)
Indication for pain mangement: Chronic Neck pain Medication and dose: Tramadol # pills per month: 120 on 05/05/19 Last UDS date: None Opioid Treatment Agreement signed (Y/N): Yes, 11/16/17 Opioid Treatment Agreement last reviewed with patient:   NCCSRS reviewed this encounter (include red flags):     LOV: 04/05/19 Chronic neck pain

## 2019-06-13 DIAGNOSIS — Z0100 Encounter for examination of eyes and vision without abnormal findings: Secondary | ICD-10-CM | POA: Diagnosis not present

## 2019-06-16 ENCOUNTER — Telehealth: Payer: Self-pay | Admitting: Physician Assistant

## 2019-06-16 NOTE — Telephone Encounter (Signed)
[facility] called in about a prescription that was prescribed to the patient, I was unable to get any more information as I was having phone troubles.

## 2019-06-16 NOTE — Telephone Encounter (Signed)
Returned call to [facility] for additional information for patient prescription sent to [facility]. They were needing Supervising MD DEA number to complete the refill

## 2019-06-27 ENCOUNTER — Encounter: Payer: Self-pay | Admitting: Physician Assistant

## 2019-06-27 DIAGNOSIS — E785 Hyperlipidemia, unspecified: Secondary | ICD-10-CM

## 2019-06-27 MED ORDER — SIMVASTATIN 40 MG PO TABS: 40.0000 mg | ORAL_TABLET | Freq: Every day | ORAL | 1 refills | Status: DC

## 2019-07-10 ENCOUNTER — Encounter: Payer: Medicare HMO | Admitting: Internal Medicine

## 2019-07-12 ENCOUNTER — Telehealth: Payer: Self-pay | Admitting: Physician Assistant

## 2019-07-12 ENCOUNTER — Encounter: Payer: Self-pay | Admitting: Physician Assistant

## 2019-07-12 DIAGNOSIS — M542 Cervicalgia: Secondary | ICD-10-CM

## 2019-07-12 MED ORDER — TRAMADOL HCL 50 MG PO TABS: ORAL_TABLET | ORAL | 0 refills | Status: DC

## 2019-07-12 NOTE — Progress Notes (Signed)
°  Chronic Care Management   Outreach Note  07/12/2019 Name: Marcenia Mawson MRN: [id number] DOB: [birth year]  Referred by: Brunetta Jeans, PA-C Reason for referral : No chief complaint on file.   An unsuccessful telephone outreach was attempted today. The patient was referred to the pharmacist for assistance with care management and care coordination.   Follow Up Plan:   Earney Hamburg Upstream Scheduler

## 2019-07-12 NOTE — Telephone Encounter (Signed)
Tramadol last rx 06/12/19 #120 LOV: 04/05/19 Chronic Neck Pain [facility] 11/16/17

## 2019-07-14 ENCOUNTER — Telehealth: Payer: Self-pay | Admitting: Physician Assistant

## 2019-07-14 NOTE — Progress Notes (Signed)
  Chronic Care Management   Outreach Note  07/14/2019 Name: Mikhayla Olaes MRN: [id number] DOB: 27-Jul-1933  Referred by: Brunetta Jeans, PA-C Reason for referral : No chief complaint on file.   A second unsuccessful telephone outreach was attempted today. The patient was referred to pharmacist for assistance with care management and care coordination.  Follow Up Plan:   Earney Hamburg Upstream Scheduler

## 2019-07-14 NOTE — Progress Notes (Signed)
°  Chronic Care Management   Note  07/14/2019 Name: [name] MRN: [id number] DOB: 22-Sep-1933  Bethany Carpenter is a 86 y.o. year old female who is a primary care patient of Delorse Limber. I reached out to [name] by phone today in response to a referral sent by [name] PCP, Brunetta Jeans, PA-C.   [name] was given information about Chronic Care Management services today including:  1. CCM service includes personalized support from designated clinical staff supervised by her physician, including individualized plan of care and coordination with other care providers 2. 24/7 contact phone numbers for assistance for urgent and routine care needs. 3. Service will only be billed when office clinical staff spend 20 minutes or more in a month to coordinate care. 4. Only one practitioner may furnish and bill the service in a calendar month. 5. The patient may stop CCM services at any time (effective at the end of the month) by phone call to the office staff.   Patient agreed to services and verbal consent obtained.   Follow up plan:   Earney Hamburg Upstream Scheduler

## 2019-07-18 ENCOUNTER — Encounter: Payer: Self-pay | Admitting: Physician Assistant

## 2019-07-18 ENCOUNTER — Telehealth: Payer: Self-pay | Admitting: Physician Assistant

## 2019-07-18 NOTE — Progress Notes (Signed)
  Chronic Care Management   Outreach Note  07/18/2019 Name: Bethany Carpenter MRN: [id number] DOB: 1933/05/17  Referred by: Brunetta Jeans, PA-C Reason for referral : No chief complaint on file.   An unsuccessful telephone outreach was attempted today. The patient was referred to the pharmacist for assistance with care management and care coordination.   Follow Up Plan:   Earney Hamburg Upstream Scheduler

## 2019-07-21 ENCOUNTER — Other Ambulatory Visit: Payer: Self-pay | Admitting: Emergency Medicine

## 2019-07-21 DIAGNOSIS — E785 Hyperlipidemia, unspecified: Secondary | ICD-10-CM

## 2019-07-21 DIAGNOSIS — I10 Essential (primary) hypertension: Secondary | ICD-10-CM

## 2019-07-25 ENCOUNTER — Other Ambulatory Visit: Payer: Self-pay

## 2019-07-25 ENCOUNTER — Ambulatory Visit: Payer: Medicare HMO

## 2019-07-25 DIAGNOSIS — E785 Hyperlipidemia, unspecified: Secondary | ICD-10-CM

## 2019-07-25 DIAGNOSIS — G8929 Other chronic pain: Secondary | ICD-10-CM

## 2019-07-25 DIAGNOSIS — M542 Cervicalgia: Secondary | ICD-10-CM

## 2019-07-25 DIAGNOSIS — I10 Essential (primary) hypertension: Secondary | ICD-10-CM

## 2019-07-25 NOTE — Progress Notes (Signed)
Chronic Care Management Pharmacy  Name: Bethany Carpenter  MRN: [id number] DOB: [birth year]  Chief Complaint/ HPI Bethany Carpenter,  86 y.o. , female presents for their Initial CCM visit with the clinical pharmacist In office.  PCP : Brunetta Jeans, PA-C.  Their chronic conditions include: HTN, HLD, chronic pain.  Office Visits:  12/30: Chronic neck pain returned to baseline; continued management with tramadol and sleep hygiene.  Outpatient Encounter Medications as of 07/25/2019  Medication Sig  . aspirin 81 MG EC tablet Take 81 mg by mouth daily.   . carvedilol (COREG) 12.5 MG tablet Take 1 tablet (12.5 mg total) by mouth 2 (two) times daily with a meal.  . fluticasone (FLONASE) 50 MCG/ACT nasal spray Place 1 spray into both nostrils daily.  Marland Kitchen ketotifen (ALAWAY) 0.025 % ophthalmic solution Place 1 drop into both eyes daily.  . simvastatin (ZOCOR) 40 MG tablet Take 1 tablet (40 mg total) by mouth daily.  . traMADol (ULTRAM) 50 MG tablet TAKE 1 TABLET EVERY 6 HOURSAS NEEDED FOR MODERATE PAIN  . lisinopril (ZESTRIL) 10 MG tablet Take 1 tablet (10 mg total) by mouth 2 (two) times daily.  . methocarbamol (ROBAXIN) 500 MG tablet Take 0.5 tablets (250 mg total) by mouth at bedtime as needed for muscle spasms. (Patient not taking: Reported on 04/05/2019)   No facility-administered encounter medications on file as of 07/25/2019.   Current Diagnosis/Assessment: Goals Addressed            This Visit's Progress   . PharmD Care Plan       CARE PLAN ENTRY  Current Barriers:  . Chronic Disease Management support, education, and care coordination needs related to  high cholesterol, high blood pressure, chronic pain.  Pharmacist Clinical Goal(s):  Over next 6 months: . Maintain LDL cholesterol <100, blood pressure <140/90, notify us of any worsening pain.  Interventions: . Comprehensive medication review performed.  Patient Self Care Activities:  . Patient verbalizes understanding of plan  to check blood pressure once weekly and notify us if readings consistently >140/90. Please call with any questions!   Initial goal documentation      Hypertension   BP Readings from Last 3 Encounters:  03/15/19 (!) 150/80  03/08/19 136/77  01/17/19 (!) 150/64   Patient is currently controlled on the following medications: lisinopril 10 mg twice daily and carvedilol 12.5 mg by mouth two times a day with a meal. Patient checks BP at home infrequently. Patient home BP readings are ranging: 130s-140s/80s. Denies dizziness, SOB, chest pain. We discussed monitoring blood pressure once weekly at home to assess overal blood pressure. Patient agreed to monitoring plan.   Plan Continue current medications and control with diet and exercise. Monitor BP once weekly at home.    Hyperlipidemia   Lipid Panel     Component Value Date/Time   CHOL 119 05/02/2018 1142   TRIG 70.0 05/02/2018 1142   HDL 56.70 05/02/2018 1142   CHOLHDL 2 05/02/2018 1142   VLDL 14.0 05/02/2018 1142   LDLCALC 49 05/02/2018 1142    Patient is currently controlled on the following medications: simvastatin 40 mg daily. Denies any muscle or abdominal pain or n/v. Also taking aspirin 81 mg once daily for CVD prevention. Last lipid panel 04/2018, history of being well controlled. Denies any abnormal bruising, bleeding from nose or gums or blood in urine or stool.  Plan Continue current medications.    Chronic Pain  Patient is currently controlled on the following medications: tramadol 50  mg every 6 hours as needed for moderate pain. No longer using methocarbamol 250 mg at bedtime as needed. Reports adequate pain control and is content with current dosing. Will use warm compress in addition to tramadol if needed. Denies any side effects at this time.  Plan Continue current medications.  Vaccines  Reviewed and patient's vaccination history.    Immunization History  Administered Date(s) Administered  . Fluad Quad(high  Dose 65+) 12/06/2018  . Influenza, High Dose Seasonal PF 01/03/2018  . Influenza,inj,Quad PF,6+ Mos 01/06/2016, 01/04/2017  . Influenza,inj,quad, With Preservative 01/06/2016  . Influenza-Unspecified 01/05/2017  . Pneumococcal Conjugate-13 05/15/2015  . Pneumococcal Polysaccharide-23 04/06/2001  . Tdap 04/06/2012, 04/06/2012   Plan Recommended patient receive Shingles vaccine in office/pharmacy.  ______________ Visit Information SDOH (Social Determinants of Health) assessments performed: Yes.   Food Insecurity: No Food Insecurity  . Worried About Charity fundraiser in the Last Year: Never true  . Ran Out of Food in the Last Year: Never true     Transportation Needs: No Transportation Needs  . Lack of Transportation (Medical): No  . Lack of Transportation (Non-Medical): No   [name] was given information about Chronic Care Management services today including:  1. CCM service includes personalized support from designated clinical staff supervised by her physician, including individualized plan of care and coordination with other care providers 2. 24/7 contact phone numbers for assistance for urgent and routine care needs. 3. Standard insurance, coinsurance, copays and deductibles apply for chronic care management only during months in which we provide at least 20 minutes of these services. Most insurances cover these services at 100%, however patients may be responsible for any copay, coinsurance and/or deductible if applicable. This service may help you avoid the need for more expensive face-to-face services. 4. Only one practitioner may furnish and bill the service in a calendar month. 5. The patient may stop CCM services at any time (effective at the end of the month) by phone call to the office staff.  Patient agreed to services and verbal consent obtained.   Madelin Rear, Pharm.D. Clinical Pharmacist [place] [facility] at [facility] [phone]

## 2019-07-25 NOTE — Patient Instructions (Addendum)
Visit Information  Goals Addressed            This Visit's Progress   . PharmD Care Plan       CARE PLAN ENTRY  Current Barriers:  . Chronic Disease Management support, education, and care coordination needs related to  high cholesterol, high blood pressure, chronic pain.  Pharmacist Clinical Goal(s):  Over next 6 months: . Maintain LDL cholesterol <100, blood pressure <140/90, notify us of any worsening pain.  Interventions: . Comprehensive medication review performed.  Patient Self Care Activities:  . Patient verbalizes understanding of plan to check blood pressure once weekly and notify us if readings consistently >140/90. Please call with any questions!   Initial goal documentation      Bethany Carpenter was given information about Chronic Care Management services today including:  1. CCM service includes personalized support from designated clinical staff supervised by her physician, including individualized plan of care and coordination with other care providers 2. 24/7 contact phone numbers for assistance for urgent and routine care needs. 3. Standard insurance, coinsurance, copays and deductibles apply for chronic care management only during months in which we provide at least 20 minutes of these services. Most insurances cover these services at 100%, however patients may be responsible for any copay, coinsurance and/or deductible if applicable. This service may help you avoid the need for more expensive face-to-face services. 4. Only one practitioner may furnish and bill the service in a calendar month. 5. The patient may stop CCM services at any time (effective at the end of the month) by phone call to the office staff.  Patient agreed to services and verbal consent obtained.   The patient verbalized understanding of instructions provided today and agreed to receive a mailed copy of patient instruction and/or educational materials. Telephone follow up appointment with  pharmacy team member scheduled for: See next appointment with "Care Management Staff" under "What's Next" below.   Thank you!  Madelin Rear, Pharm.D. Clinical Pharmacist [place] [facility] at [facility] [phone]  Hypertension, Adult High blood pressure (hypertension) is when the force of blood pumping through the arteries is too strong. The arteries are the blood vessels that carry blood from the heart throughout the body. Hypertension forces the heart to work harder to pump blood and may cause arteries to become narrow or stiff. Untreated or uncontrolled hypertension can cause a heart attack, heart failure, a stroke, kidney disease, and other problems. A blood pressure reading consists of a higher number over a lower number. Ideally, your blood pressure should be below 120/80. The first ("top") number is called the systolic pressure. It is a measure of the pressure in your arteries as your heart beats. The second ("bottom") number is called the diastolic pressure. It is a measure of the pressure in your arteries as the heart relaxes. What are the causes? The exact cause of this condition is not known. There are some conditions that result in or are related to high blood pressure. What increases the risk? Some risk factors for high blood pressure are under your control. The following factors may make you more likely to develop this condition:  Smoking.  Having type 2 diabetes mellitus, high cholesterol, or both.  Not getting enough exercise or physical activity.  Being overweight.  Having too much fat, sugar, calories, or salt (sodium) in your diet.  Drinking too much alcohol. Some risk factors for high blood pressure may be difficult or impossible to change. Some of these factors  include:  Having chronic kidney disease.  Having a family history of high blood pressure.  Age. Risk increases with age.  Race. You may be at higher risk if you are African  American.  Gender. Men are at higher risk than women before age 18. After age 64, women are at higher risk than men.  Having obstructive sleep apnea.  Stress. What are the signs or symptoms? High blood pressure may not cause symptoms. Very high blood pressure (hypertensive crisis) may cause:  Headache.  Anxiety.  Shortness of breath.  Nosebleed.  Nausea and vomiting.  Vision changes.  Severe chest pain.  Seizures. How is this diagnosed? This condition is diagnosed by measuring your blood pressure while you are seated, with your arm resting on a flat surface, your legs uncrossed, and your feet flat on the floor. The cuff of the blood pressure monitor will be placed directly against the skin of your upper arm at the level of your heart. It should be measured at least twice using the same arm. Certain conditions can cause a difference in blood pressure between your right and left arms. Certain factors can cause blood pressure readings to be lower or higher than normal for a short period of time:  When your blood pressure is higher when you are in a health care provider's office than when you are at home, this is called white coat hypertension. Most people with this condition do not need medicines.  When your blood pressure is higher at home than when you are in a health care provider's office, this is called masked hypertension. Most people with this condition may need medicines to control blood pressure. If you have a high blood pressure reading during one visit or you have normal blood pressure with other risk factors, you may be asked to:  Return on a different day to have your blood pressure checked again.  Monitor your blood pressure at home for 1 week or longer. If you are diagnosed with hypertension, you may have other blood or imaging tests to help your health care provider understand your overall risk for other conditions. How is this treated? This condition is treated by  making healthy lifestyle changes, such as eating healthy foods, exercising more, and reducing your alcohol intake. Your health care provider may prescribe medicine if lifestyle changes are not enough to get your blood pressure under control, and if:  Your systolic blood pressure is above 130.  Your diastolic blood pressure is above 80. Your personal target blood pressure may vary depending on your medical conditions, your age, and other factors. Follow these instructions at home: Eating and drinking   Eat a diet that is high in fiber and potassium, and low in sodium, added sugar, and fat. An example eating plan is called the DASH (Dietary Approaches to Stop Hypertension) diet. To eat this way: ? Eat plenty of fresh fruits and vegetables. Try to fill one half of your plate at each meal with fruits and vegetables. ? Eat whole grains, such as whole-wheat pasta, brown rice, or whole-grain bread. Fill about one fourth of your plate with whole grains. ? Eat or drink low-fat dairy products, such as skim milk or low-fat yogurt. ? Avoid fatty cuts of meat, processed or cured meats, and poultry with skin. Fill about one fourth of your plate with lean proteins, such as fish, chicken without skin, beans, eggs, or tofu. ? Avoid pre-made and processed foods. These tend to be higher in sodium, added sugar,  and fat.  Reduce your daily sodium intake. Most people with hypertension should eat less than 1,500 mg of sodium a day.  Do not drink alcohol if: ? Your health care provider tells you not to drink. ? You are pregnant, may be pregnant, or are planning to become pregnant.  If you drink alcohol: ? Limit how much you use to:  0-1 drink a day for women.  0-2 drinks a day for men. ? Be aware of how much alcohol is in your drink. In the [place] one drink equals one 12 oz bottle of beer (355 mL), one 5 oz glass of wine (148 mL), or one 1 oz glass of hard liquor (44 mL). Lifestyle   Work with your health  care provider to maintain a healthy body weight or to lose weight. Ask what an ideal weight is for you.  Get at least 30 minutes of exercise most days of the week. Activities may include walking, swimming, or biking.  Include exercise to strengthen your muscles (resistance exercise), such as Pilates or lifting weights, as part of your weekly exercise routine. Try to do these types of exercises for 30 minutes at least 3 days a week.  Do not use any products that contain nicotine or tobacco, such as cigarettes, e-cigarettes, and chewing tobacco. If you need help quitting, ask your health care provider.  Monitor your blood pressure at home as told by your health care provider.  Keep all follow-up visits as told by your health care provider. This is important. Medicines  Take over-the-counter and prescription medicines only as told by your health care provider. Follow directions carefully. Blood pressure medicines must be taken as prescribed.  Do not skip doses of blood pressure medicine. Doing this puts you at risk for problems and can make the medicine less effective.  Ask your health care provider about side effects or reactions to medicines that you should watch for. Contact a health care provider if you:  Think you are having a reaction to a medicine you are taking.  Have headaches that keep coming back (recurring).  Feel dizzy.  Have swelling in your ankles.  Have trouble with your vision. Get help right away if you:  Develop a severe headache or confusion.  Have unusual weakness or numbness.  Feel faint.  Have severe pain in your chest or abdomen.  Vomit repeatedly.  Have trouble breathing. Summary  Hypertension is when the force of blood pumping through your arteries is too strong. If this condition is not controlled, it may put you at risk for serious complications.  Your personal target blood pressure may vary depending on your medical conditions, your age, and  other factors. For most people, a normal blood pressure is less than 120/80.  Hypertension is treated with lifestyle changes, medicines, or a combination of both. Lifestyle changes include losing weight, eating a healthy, low-sodium diet, exercising more, and limiting alcohol. This information is not intended to replace advice given to you by your health care provider. Make sure you discuss any questions you have with your health care provider. Document Revised: 12/01/2017 Document Reviewed: 12/01/2017 Elsevier Patient Education  2020 [employer].

## 2019-08-04 ENCOUNTER — Ambulatory Visit: Payer: Medicare HMO

## 2019-08-07 ENCOUNTER — Ambulatory Visit (INDEPENDENT_AMBULATORY_CARE_PROVIDER_SITE_OTHER): Payer: Medicare HMO | Admitting: *Deleted

## 2019-08-07 ENCOUNTER — Encounter: Payer: Self-pay | Admitting: Physician Assistant

## 2019-08-07 DIAGNOSIS — I10 Essential (primary) hypertension: Secondary | ICD-10-CM

## 2019-08-07 DIAGNOSIS — I442 Atrioventricular block, complete: Secondary | ICD-10-CM | POA: Diagnosis not present

## 2019-08-07 LAB — CUP PACEART REMOTE DEVICE CHECK
Battery Remaining Longevity: 133 mo
Battery Voltage: 3.15 V
Brady Statistic AP VP Percent: 7.98 %
Brady Statistic AP VS Percent: 0 %
Brady Statistic AS VP Percent: 89.8 %
Brady Statistic AS VS Percent: 2.22 %
Brady Statistic RA Percent Paced: 8.42 %
Brady Statistic RV Percent Paced: 97.78 %
Date Time Interrogation Session: 20210502201458
Implantable Lead Implant Date: 20201008
Implantable Lead Implant Date: 20201008
Implantable Lead Location: 753859
Implantable Lead Location: 753860
Implantable Lead Model: 5076
Implantable Lead Model: 5076
Implantable Pulse Generator Implant Date: 20201008
Lead Channel Impedance Value: 266 Ohm
Lead Channel Impedance Value: 342 Ohm
Lead Channel Impedance Value: 456 Ohm
Lead Channel Impedance Value: 475 Ohm
Lead Channel Pacing Threshold Amplitude: 0.75 V
Lead Channel Pacing Threshold Amplitude: 0.75 V
Lead Channel Pacing Threshold Pulse Width: 0.4 ms
Lead Channel Pacing Threshold Pulse Width: 0.4 ms
Lead Channel Sensing Intrinsic Amplitude: 0.625 mV
Lead Channel Sensing Intrinsic Amplitude: 0.625 mV
Lead Channel Sensing Intrinsic Amplitude: 16.25 mV
Lead Channel Sensing Intrinsic Amplitude: 16.25 mV
Lead Channel Setting Pacing Amplitude: 1.75 V
Lead Channel Setting Pacing Amplitude: 2.5 V
Lead Channel Setting Pacing Pulse Width: 0.4 ms
Lead Channel Setting Sensing Sensitivity: 5.6 mV

## 2019-08-07 MED ORDER — CARVEDILOL 12.5 MG PO TABS: 12.5000 mg | ORAL_TABLET | Freq: Two times a day (BID) | ORAL | 1 refills | Status: DC

## 2019-08-07 MED ORDER — LISINOPRIL 10 MG PO TABS: 10.0000 mg | ORAL_TABLET | Freq: Two times a day (BID) | ORAL | 1 refills | Status: DC

## 2019-08-08 NOTE — Progress Notes (Signed)
Remote pacemaker transmission.   

## 2019-08-10 ENCOUNTER — Encounter: Payer: Self-pay | Admitting: Physician Assistant

## 2019-08-10 ENCOUNTER — Other Ambulatory Visit: Payer: Self-pay

## 2019-08-10 DIAGNOSIS — M542 Cervicalgia: Secondary | ICD-10-CM

## 2019-08-10 NOTE — Telephone Encounter (Signed)
See MyChart message. Can we call patient to discuss her changing her emergency contact? Thank you!

## 2019-08-11 MED ORDER — TRAMADOL HCL 50 MG PO TABS: ORAL_TABLET | ORAL | 0 refills | Status: DC

## 2019-08-11 NOTE — Progress Notes (Signed)
Rx has been sent  

## 2019-08-21 ENCOUNTER — Encounter: Payer: Self-pay | Admitting: Physician Assistant

## 2019-09-15 ENCOUNTER — Encounter: Payer: Self-pay | Admitting: Physician Assistant

## 2019-09-15 DIAGNOSIS — M542 Cervicalgia: Secondary | ICD-10-CM

## 2019-09-15 MED ORDER — TRAMADOL HCL 50 MG PO TABS: ORAL_TABLET | ORAL | 0 refills | Status: DC

## 2019-10-17 ENCOUNTER — Other Ambulatory Visit: Payer: Self-pay

## 2019-10-17 ENCOUNTER — Encounter: Payer: Self-pay | Admitting: Physician Assistant

## 2019-10-17 DIAGNOSIS — M542 Cervicalgia: Secondary | ICD-10-CM

## 2019-10-17 NOTE — Telephone Encounter (Signed)
Hello Cody:  It's that time again.Marland KitchenMarland KitchenI need some Tramadol tabs (120)  [facility] from [facility] , [phone].   Last filled on 09/20/19.  Thank You.  [name]  LFD 09/15/19 #120 with no refills LOV 04/05/19 NOV none

## 2019-10-18 MED ORDER — TRAMADOL HCL 50 MG PO TABS: ORAL_TABLET | ORAL | 0 refills | Status: DC

## 2019-11-06 ENCOUNTER — Ambulatory Visit (INDEPENDENT_AMBULATORY_CARE_PROVIDER_SITE_OTHER): Payer: Medicare HMO | Admitting: *Deleted

## 2019-11-06 DIAGNOSIS — I442 Atrioventricular block, complete: Secondary | ICD-10-CM | POA: Diagnosis not present

## 2019-11-07 ENCOUNTER — Ambulatory Visit (INDEPENDENT_AMBULATORY_CARE_PROVIDER_SITE_OTHER): Payer: Medicare HMO | Admitting: Physician Assistant

## 2019-11-07 ENCOUNTER — Other Ambulatory Visit: Payer: Self-pay

## 2019-11-07 ENCOUNTER — Encounter: Payer: Self-pay | Admitting: Physician Assistant

## 2019-11-07 VITALS — BP 100/70 | HR 69 | Temp 97.0°F | Resp 14 | Ht 61.0 in | Wt 109.0 lb

## 2019-11-07 DIAGNOSIS — Z780 Asymptomatic menopausal state: Secondary | ICD-10-CM | POA: Diagnosis not present

## 2019-11-07 DIAGNOSIS — M542 Cervicalgia: Secondary | ICD-10-CM

## 2019-11-07 DIAGNOSIS — G8929 Other chronic pain: Secondary | ICD-10-CM | POA: Diagnosis not present

## 2019-11-07 LAB — CUP PACEART REMOTE DEVICE CHECK
Battery Remaining Longevity: 128 mo
Battery Voltage: 3.07 V
Brady Statistic AP VP Percent: 4.89 %
Brady Statistic AP VS Percent: 0 %
Brady Statistic AS VP Percent: 94.6 %
Brady Statistic AS VS Percent: 0.51 %
Brady Statistic RA Percent Paced: 5.02 %
Brady Statistic RV Percent Paced: 99.49 %
Date Time Interrogation Session: 20210802013912
Implantable Lead Implant Date: 20201008
Implantable Lead Implant Date: 20201008
Implantable Lead Location: 753859
Implantable Lead Location: 753860
Implantable Lead Model: 5076
Implantable Lead Model: 5076
Implantable Pulse Generator Implant Date: 20201008
Lead Channel Impedance Value: 247 Ohm
Lead Channel Impedance Value: 323 Ohm
Lead Channel Impedance Value: 323 Ohm
Lead Channel Impedance Value: 456 Ohm
Lead Channel Pacing Threshold Amplitude: 0.5 V
Lead Channel Pacing Threshold Amplitude: 0.625 V
Lead Channel Pacing Threshold Pulse Width: 0.4 ms
Lead Channel Pacing Threshold Pulse Width: 0.4 ms
Lead Channel Sensing Intrinsic Amplitude: 0.75 mV
Lead Channel Sensing Intrinsic Amplitude: 0.75 mV
Lead Channel Sensing Intrinsic Amplitude: 16.25 mV
Lead Channel Sensing Intrinsic Amplitude: 16.25 mV
Lead Channel Setting Pacing Amplitude: 1.5 V
Lead Channel Setting Pacing Amplitude: 2.5 V
Lead Channel Setting Pacing Pulse Width: 0.4 ms
Lead Channel Setting Sensing Sensitivity: 5.6 mV

## 2019-11-07 NOTE — Progress Notes (Signed)
Patient presents to clinic today for routine follow-up of chronic neck pain related to degenerative disc disease/OA.  Patient currently on a regimen of tramadol 50 mg, taking up to 1 tablet every 6 hours as needed for moderate to severe pain.  Patient endorses taking medications as directed and still tolerating well.  Also continues her 81 mg ASA daily.  Notes the medicine works very well for her and allows her to continue completing her ADLs independently.  Also helping her be pain-free throughout the night so she can rest.  Patient denies any breakthrough symptoms or any new symptoms.  Past Medical History:  Diagnosis Date  . Allergy   . Arthritis   . Cancer (HCC)    Skin  . Colon polyps   . Heart disease   . History of chickenpox   . Hyperlipidemia   . Hypertension   . Presence of permanent cardiac pacemaker 01/12/2019    Current Outpatient Medications on File Prior to Visit  Medication Sig Dispense Refill  . aspirin 81 MG EC tablet Take 81 mg by mouth daily.     . carvedilol (COREG) 12.5 MG tablet Take 1 tablet (12.5 mg total) by mouth 2 (two) times daily with a meal. 180 tablet 1  . fluticasone (FLONASE) 50 MCG/ACT nasal spray Place 1 spray into both nostrils daily.    Marland Kitchen ketotifen (ALAWAY) 0.025 % ophthalmic solution Place 1 drop into both eyes daily.    Marland Kitchen lisinopril (ZESTRIL) 10 MG tablet Take 1 tablet (10 mg total) by mouth 2 (two) times daily. 180 tablet 1  . prednisoLONE acetate (PRED FORTE) 1 % ophthalmic suspension 1 drop 2 (two) times daily.    . simvastatin (ZOCOR) 40 MG tablet Take 1 tablet (40 mg total) by mouth daily. 90 tablet 1  . traMADol (ULTRAM) 50 MG tablet TAKE 1 TABLET EVERY 6 HOURSAS NEEDED FOR MODERATE PAIN 120 tablet 0   No current facility-administered medications on file prior to visit.    Allergies  Allergen Reactions  . Other Hives and Itching    Fragrances  . Nickel   . Penicillins Rash    Did it involve swelling of the face/tongue/throat, SOB,  or low BP? No Did it involve sudden or severe rash/hives, skin peeling, or any reaction on the inside of your mouth or nose? Yes Did you need to seek medical attention at a hospital or doctor's office? Yes When did it last happen?years ago  If all above answers are "NO", may proceed with cephalosporin use.   . Sulfa Antibiotics Rash    Family History  Problem Relation Age of Onset  . Hearing loss Mother   . Hypertension Mother   . Osteoporosis Mother   . Arthritis Mother   . Cancer Father        Skin  . Hearing loss Father   . Arthritis Father   . Diabetes Brother   . Stroke Brother   . Early death Brother   . Depression Daughter   . Early death Son   . Early death Maternal Grandfather   . Early death Paternal Grandfather     Social History   Socioeconomic History  . Marital status: Divorced    Spouse name: Not on file  . Number of children: Not on file  . Years of education: Not on file  . Highest education level: Not on file  Occupational History  . Occupation: Retired  Tobacco Use  . Smoking status: Never Smoker  . Smokeless  tobacco: Never Used  Vaping Use  . Vaping Use: Never used  Substance and Sexual Activity  . Alcohol use: No  . Drug use: No  . Sexual activity: Not Currently  Other Topics Concern  . Not on file  Social History Narrative  . Not on file   Social Determinants of Health   Financial Resource Strain:   . Difficulty of Paying Living Expenses:   Food Insecurity: No Food Insecurity  . Worried About Charity fundraiser in the Last Year: Never true  . Ran Out of Food in the Last Year: Never true  Transportation Needs: No Transportation Needs  . Lack of Transportation (Medical): No  . Lack of Transportation (Non-Medical): No  Physical Activity:   . Days of Exercise per Week:   . Minutes of Exercise per Session:   Stress:   . Feeling of Stress :   Social Connections:   . Frequency of Communication with Friends and Family:   .  Frequency of Social Gatherings with Friends and Family:   . Attends Religious Services:   . Active Member of Clubs or Organizations:   . Attends Archivist Meetings:   Marland Kitchen Marital Status:     Review of Systems - See HPI.  All other ROS are negative.  Temp (!) 97 F (36.1 C) (Temporal)   Resp 14   Ht 5\' 1"  (1.549 m)   Wt 109 lb (49.4 kg)   BMI 20.60 kg/m   Physical Exam Vitals reviewed.  Constitutional:      Appearance: Normal appearance.  HENT:     Head: Normocephalic and atraumatic.  Eyes:     Conjunctiva/sclera: Conjunctivae normal.  Cardiovascular:     Rate and Rhythm: Normal rate and regular rhythm.     Pulses: Normal pulses.     Heart sounds: Normal heart sounds.  Pulmonary:     Effort: Pulmonary effort is normal.     Breath sounds: Normal breath sounds.  Musculoskeletal:     Cervical back: Neck supple.  Neurological:     General: No focal deficit present.     Mental Status: She is alert and oriented to person, place, and time.    Assessment/Plan: 1. Chronic neck pain Patient doing very well overall.  PDMP reviewed no red flags.  We will continue tramadol as directed.  We will follow-up every 3 months.  2. Postmenopausal estrogen deficiency Reviewing EMR patient is due for bone density scan.  Order placed. - DG Bone Density; Future   This visit occurred during the SARS-CoV-2 public health emergency.  Safety protocols were in place, including screening questions prior to the visit, additional usage of staff PPE, and extensive cleaning of exam room while observing appropriate contact time as indicated for disinfecting solutions.       Leeanne Rio, PA-C

## 2019-11-07 NOTE — Patient Instructions (Signed)
Please continue chronic medications as directed. Try out the heating pad as discussed. Apply topical Voltaren to the hands.  You will be contacted to schedule your bone density test.    Sciatica Rehab Ask your health care provider which exercises are safe for you. Do exercises exactly as told by your health care provider and adjust them as directed. It is normal to feel mild stretching, pulling, tightness, or discomfort as you do these exercises. Stop right away if you feel sudden pain or your pain gets worse. Do not begin these exercises until told by your health care provider. Stretching and range-of-motion exercises These exercises warm up your muscles and joints and improve the movement and flexibility of your hips and back. These exercises also help to relieve pain, numbness, and tingling. Sciatic nerve glide 1. Sit in a chair with your head facing down toward your chest. Place your hands behind your back. Let your shoulders slump forward. 2. Slowly straighten one of your legs while you tilt your head back as if you are looking toward the ceiling. Only straighten your leg as far as you can without making your symptoms worse. 3. Hold this position for __________ seconds. 4. Slowly return to the starting position. 5. Repeat with your other leg. Repeat __________ times. Complete this exercise __________ times a day. Knee to chest with hip adduction and internal rotation  1. Lie on your back on a firm surface with both legs straight. 2. Bend one of your knees and move it up toward your chest until you feel a gentle stretch in your lower back and buttock. Then, move your knee toward the shoulder that is on the opposite side from your leg. This is hip adduction and internal rotation. ? Hold your leg in this position by holding on to the front of your knee. 3. Hold this position for __________ seconds. 4. Slowly return to the starting position. 5. Repeat with your other leg. Repeat __________  times. Complete this exercise __________ times a day. Prone extension on elbows  1. Lie on your abdomen on a firm surface. A bed may be too soft for this exercise. 2. Prop yourself up on your elbows. 3. Use your arms to help lift your chest up until you feel a gentle stretch in your abdomen and your lower back. ? This will place some of your body weight on your elbows. If this is uncomfortable, try stacking pillows under your chest. ? Your hips should stay down, against the surface that you are lying on. Keep your hip and back muscles relaxed. 4. Hold this position for __________ seconds. 5. Slowly relax your upper body and return to the starting position. Repeat __________ times. Complete this exercise __________ times a day. Strengthening exercises These exercises build strength and endurance in your back. Endurance is the ability to use your muscles for a long time, even after they get tired. Pelvic tilt This exercise strengthens the muscles that lie deep in the abdomen. 1. Lie on your back on a firm surface. Bend your knees and keep your feet flat on the floor. 2. Tense your abdominal muscles. Tip your pelvis up toward the ceiling and flatten your lower back into the floor. ? To help with this exercise, you may place a small towel under your lower back and try to push your back into the towel. 3. Hold this position for __________ seconds. 4. Let your muscles relax completely before you repeat this exercise. Repeat __________ times. Complete this exercise  __________ times a day. Alternating arm and leg raises  1. Get on your hands and knees on a firm surface. If you are on a hard floor, you may want to use padding, such as an exercise mat, to cushion your knees. 2. Line up your arms and legs. Your hands should be directly below your shoulders, and your knees should be directly below your hips. 3. Lift your left leg behind you. At the same time, raise your right arm and straighten it in  front of you. ? Do not lift your leg higher than your hip. ? Do not lift your arm higher than your shoulder. ? Keep your abdominal and back muscles tight. ? Keep your hips facing the ground. ? Do not arch your back. ? Keep your balance carefully, and do not hold your breath. 4. Hold this position for __________ seconds. 5. Slowly return to the starting position. 6. Repeat with your right leg and your left arm. Repeat __________ times. Complete this exercise __________ times a day. Posture and body mechanics Good posture and healthy body mechanics can help to relieve stress in your body's tissues and joints. Body mechanics refers to the movements and positions of your body while you do your daily activities. Posture is part of body mechanics. Good posture means:  Your spine is in its natural S-curve position (neutral).  Your shoulders are pulled back slightly.  Your head is not tipped forward. Follow these guidelines to improve your posture and body mechanics in your everyday activities. Standing   When standing, keep your spine neutral and your feet about hip width apart. Keep a slight bend in your knees. Your ears, shoulders, and hips should line up.  When you do a task in which you stand in one place for a long time, place one foot up on a stable object that is 2-4 inches (5-10 cm) high, such as a footstool. This helps keep your spine neutral. Sitting   When sitting, keep your spine neutral and keep your feet flat on the floor. Use a footrest, if necessary, and keep your thighs parallel to the floor. Avoid rounding your shoulders, and avoid tilting your head forward.  When working at a desk or a computer, keep your desk at a height where your hands are slightly lower than your elbows. Slide your chair under your desk so you are close enough to maintain good posture.  When working at a computer, place your monitor at a height where you are looking straight ahead and you do not have  to tilt your head forward or downward to look at the screen. Resting  When lying down and resting, avoid positions that are most painful for you.  If you have pain with activities such as sitting, bending, stooping, or squatting, lie in a position in which your body does not bend very much. For example, avoid curling up on your side with your arms and knees near your chest (fetal position).  If you have pain with activities such as standing for a long time or reaching with your arms, lie with your spine in a neutral position and bend your knees slightly. Try the following positions: ? Lying on your side with a pillow between your knees. ? Lying on your back with a pillow under your knees. Lifting   When lifting objects, keep your feet at least shoulder width apart and tighten your abdominal muscles.  Bend your knees and hips and keep your spine neutral. It is important  to lift using the strength of your legs, not your back. Do not lock your knees straight out.  Always ask for help to lift heavy or awkward objects. This information is not intended to replace advice given to you by your health care provider. Make sure you discuss any questions you have with your health care provider. Document Revised: 07/15/2018 Document Reviewed: 04/14/2018 Elsevier Patient Education  [place].

## 2019-11-09 NOTE — Progress Notes (Signed)
Remote pacemaker transmission.   

## 2019-11-17 ENCOUNTER — Encounter: Payer: Self-pay | Admitting: Physician Assistant

## 2019-11-17 DIAGNOSIS — M542 Cervicalgia: Secondary | ICD-10-CM

## 2019-11-17 MED ORDER — TRAMADOL HCL 50 MG PO TABS: ORAL_TABLET | ORAL | 0 refills | Status: DC

## 2019-12-20 ENCOUNTER — Encounter: Payer: Self-pay | Admitting: Physician Assistant

## 2019-12-20 DIAGNOSIS — M542 Cervicalgia: Secondary | ICD-10-CM

## 2019-12-20 MED ORDER — TRAMADOL HCL 50 MG PO TABS: ORAL_TABLET | ORAL | 0 refills | Status: DC

## 2019-12-20 NOTE — Telephone Encounter (Signed)
Tramadol last rx 11/17/19 #120 LOV: 11/07/19 Neck pain

## 2020-01-02 DIAGNOSIS — R69 Illness, unspecified: Secondary | ICD-10-CM | POA: Diagnosis not present

## 2020-01-09 ENCOUNTER — Other Ambulatory Visit: Payer: Self-pay

## 2020-01-09 ENCOUNTER — Encounter: Payer: Self-pay | Admitting: Physician Assistant

## 2020-01-09 DIAGNOSIS — E785 Hyperlipidemia, unspecified: Secondary | ICD-10-CM

## 2020-01-09 MED ORDER — SIMVASTATIN 40 MG PO TABS: 40.0000 mg | ORAL_TABLET | Freq: Every day | ORAL | 0 refills | Status: DC

## 2020-01-15 ENCOUNTER — Telehealth: Payer: Medicare HMO

## 2020-01-19 ENCOUNTER — Telehealth: Payer: Medicare HMO

## 2020-01-22 ENCOUNTER — Encounter: Payer: Self-pay | Admitting: Physician Assistant

## 2020-01-22 DIAGNOSIS — M542 Cervicalgia: Secondary | ICD-10-CM

## 2020-01-22 MED ORDER — TRAMADOL HCL 50 MG PO TABS: ORAL_TABLET | ORAL | 0 refills | Status: DC

## 2020-01-22 NOTE — Telephone Encounter (Signed)
Indication for medication: Cervicalgia Medication and dose: Tramadol 50mg  # pills per month: 120 on 12/20/19 Last UDS date: None Opioid Treatment Agreement signed (Y/N): Yes 11/16/2017 Opioid Treatment Agreement last reviewed with patient:   [facility] reviewed this encounter (include red flags):     LOV: 11/07/19

## 2020-02-05 ENCOUNTER — Ambulatory Visit (INDEPENDENT_AMBULATORY_CARE_PROVIDER_SITE_OTHER): Payer: Medicare HMO

## 2020-02-05 DIAGNOSIS — I442 Atrioventricular block, complete: Secondary | ICD-10-CM | POA: Diagnosis not present

## 2020-02-06 LAB — CUP PACEART REMOTE DEVICE CHECK
Battery Remaining Longevity: 125 mo
Battery Voltage: 3.03 V
Brady Statistic AP VP Percent: 8.95 %
Brady Statistic AP VS Percent: 0 %
Brady Statistic AS VP Percent: 90.59 %
Brady Statistic AS VS Percent: 0.46 %
Brady Statistic RA Percent Paced: 9.13 %
Brady Statistic RV Percent Paced: 99.54 %
Date Time Interrogation Session: 20211101011929
Implantable Lead Implant Date: 20201008
Implantable Lead Implant Date: 20201008
Implantable Lead Location: 753859
Implantable Lead Location: 753860
Implantable Lead Model: 5076
Implantable Lead Model: 5076
Implantable Pulse Generator Implant Date: 20201008
Lead Channel Impedance Value: 247 Ohm
Lead Channel Impedance Value: 323 Ohm
Lead Channel Impedance Value: 342 Ohm
Lead Channel Impedance Value: 456 Ohm
Lead Channel Pacing Threshold Amplitude: 0.5 V
Lead Channel Pacing Threshold Amplitude: 0.75 V
Lead Channel Pacing Threshold Pulse Width: 0.4 ms
Lead Channel Pacing Threshold Pulse Width: 0.4 ms
Lead Channel Sensing Intrinsic Amplitude: 1.5 mV
Lead Channel Sensing Intrinsic Amplitude: 1.5 mV
Lead Channel Sensing Intrinsic Amplitude: 16.25 mV
Lead Channel Sensing Intrinsic Amplitude: 16.25 mV
Lead Channel Setting Pacing Amplitude: 1.5 V
Lead Channel Setting Pacing Amplitude: 2.5 V
Lead Channel Setting Pacing Pulse Width: 0.4 ms
Lead Channel Setting Sensing Sensitivity: 5.6 mV

## 2020-02-07 NOTE — Progress Notes (Signed)
Remote pacemaker transmission.   

## 2020-02-13 ENCOUNTER — Ambulatory Visit
Admission: RE | Admit: 2020-02-13 | Discharge: 2020-02-13 | Disposition: A | Discharge status: Home / Self Care | Payer: Medicare HMO | Source: Ambulatory Visit | Attending: Physician Assistant | Admitting: Physician Assistant

## 2020-02-13 ENCOUNTER — Other Ambulatory Visit: Payer: Self-pay

## 2020-02-13 DIAGNOSIS — M810 Age-related osteoporosis without current pathological fracture: Secondary | ICD-10-CM | POA: Diagnosis not present

## 2020-02-13 DIAGNOSIS — Z780 Asymptomatic menopausal state: Secondary | ICD-10-CM

## 2020-02-17 ENCOUNTER — Other Ambulatory Visit: Payer: Self-pay | Admitting: Physician Assistant

## 2020-02-17 DIAGNOSIS — I10 Essential (primary) hypertension: Secondary | ICD-10-CM

## 2020-02-23 ENCOUNTER — Other Ambulatory Visit: Payer: Self-pay

## 2020-02-23 ENCOUNTER — Encounter: Payer: Self-pay | Admitting: Physician Assistant

## 2020-02-23 DIAGNOSIS — M542 Cervicalgia: Secondary | ICD-10-CM

## 2020-02-23 MED ORDER — TRAMADOL HCL 50 MG PO TABS: ORAL_TABLET | ORAL | 0 refills | Status: DC

## 2020-02-23 NOTE — Telephone Encounter (Signed)
LFD 01/22/20 #120 with no refills LOV 11/07/19 NOV none  Patient states she is requesting early because of the weekend and upcoming holiday.

## 2020-03-06 ENCOUNTER — Other Ambulatory Visit: Payer: Self-pay | Admitting: Emergency Medicine

## 2020-03-06 DIAGNOSIS — M810 Age-related osteoporosis without current pathological fracture: Secondary | ICD-10-CM

## 2020-03-22 ENCOUNTER — Encounter: Payer: Self-pay | Admitting: Physician Assistant

## 2020-03-22 ENCOUNTER — Other Ambulatory Visit: Payer: Self-pay

## 2020-03-22 DIAGNOSIS — M542 Cervicalgia: Secondary | ICD-10-CM

## 2020-03-22 MED ORDER — TRAMADOL HCL 50 MG PO TABS: ORAL_TABLET | ORAL | 0 refills | Status: DC

## 2020-03-22 NOTE — Telephone Encounter (Signed)
Good Morning!  It's that time Again...needing my Tramadol Rx, 50 mg. x 120 from [facility], [phone], last filled 02/26/2020.  Many thanks and Happy Holidays,  LFD 02/23/20 #120 with no refills  LOV 11/07/19 NOV none

## 2020-03-25 ENCOUNTER — Telehealth: Payer: Self-pay | Admitting: Physician Assistant

## 2020-03-25 NOTE — Telephone Encounter (Signed)
Please call concerning rx - reference #[id number]

## 2020-03-27 NOTE — Telephone Encounter (Signed)
Called [facility] about Tramadol 50 mg. Medication has been shipped. They had billing questions/issues. This would need to be addressed by the patient

## 2020-04-15 ENCOUNTER — Encounter: Payer: Self-pay | Admitting: Physician Assistant

## 2020-04-16 ENCOUNTER — Other Ambulatory Visit: Payer: Self-pay | Admitting: Emergency Medicine

## 2020-04-16 DIAGNOSIS — E785 Hyperlipidemia, unspecified: Secondary | ICD-10-CM

## 2020-04-16 MED ORDER — SIMVASTATIN 40 MG PO TABS: 40.0000 mg | ORAL_TABLET | Freq: Every day | ORAL | 1 refills | Status: DC

## 2020-04-23 ENCOUNTER — Encounter: Payer: Self-pay | Admitting: Physician Assistant

## 2020-04-23 DIAGNOSIS — M542 Cervicalgia: Secondary | ICD-10-CM

## 2020-04-23 MED ORDER — TRAMADOL HCL 50 MG PO TABS: ORAL_TABLET | ORAL | 0 refills | Status: DC

## 2020-04-23 NOTE — Telephone Encounter (Signed)
Tramadol last rx: 03/22/20 #120 LOV: 11/07/19 Chronic neck pain CSC: 11/16/17

## 2020-04-29 ENCOUNTER — Other Ambulatory Visit: Payer: Self-pay

## 2020-04-29 ENCOUNTER — Encounter: Payer: Self-pay | Admitting: Physician Assistant

## 2020-04-29 ENCOUNTER — Ambulatory Visit (INDEPENDENT_AMBULATORY_CARE_PROVIDER_SITE_OTHER): Payer: Medicare HMO | Admitting: Physician Assistant

## 2020-04-29 VITALS — BP 130/70 | HR 73 | Temp 97.6°F | Resp 14 | Ht 61.0 in | Wt 113.0 lb

## 2020-04-29 DIAGNOSIS — M810 Age-related osteoporosis without current pathological fracture: Secondary | ICD-10-CM | POA: Diagnosis not present

## 2020-04-29 DIAGNOSIS — T148XXA Other injury of unspecified body region, initial encounter: Secondary | ICD-10-CM

## 2020-04-29 LAB — VITAMIN D 25 HYDROXY (VIT D DEFICIENCY, FRACTURES): VITD: 30.65 ng/mL (ref 30.00–100.00)

## 2020-04-29 MED ORDER — DOXYCYCLINE HYCLATE 100 MG PO TABS: 100.0000 mg | ORAL_TABLET | Freq: Two times a day (BID) | ORAL | 0 refills | Status: DC

## 2020-04-29 NOTE — Patient Instructions (Signed)
Please keep skin clean and dry, using warm, soapy water to clean and then patting completely dry.   You can let air out if you are just at home. If having to do something you thing will get the area dirty, you need to cover with a bandage.   Take the antibiotic given as directed. This will take a couple of weeks to fully heal. Please let us know if symptoms do not continue to improve/resolve.

## 2020-04-29 NOTE — Progress Notes (Signed)
Patient presents to clinic today c/o skin tear to the dorsal left wrist occurring around 1 week ago when she tripped while walking outside, falling down.  Denies any fall on outstretched hand.  Denies any head trauma or injury.  Was able to get herself back up.  Noted a tear skin on her wrist.  States she immediately cleaned the area.  Was quite tender.  Denies any decreased range of motion of wrist, pain with range of motion, bruising or swelling.  Has noted some redness from the wound.  Continues to keep this clean and dry but states it is not healing.  Wanted evaluated.  Thankfully she denies fever, chills or malaise or fatigue.  Past Medical History:  Diagnosis Date  . Allergy   . Arthritis   . Cancer (HCC)    Skin  . Colon polyps   . Heart disease   . History of chickenpox   . Hyperlipidemia   . Hypertension   . Presence of permanent cardiac pacemaker 01/12/2019    Current Outpatient Medications on File Prior to Visit  Medication Sig Dispense Refill  . aspirin 81 MG EC tablet Take 81 mg by mouth daily.     . carvedilol (COREG) 12.5 MG tablet TAKE 1 TABLET TWICE DAILY  WITH MEALS 180 tablet 1  . fluticasone (FLONASE) 50 MCG/ACT nasal spray Place 1 spray into both nostrils daily.    Marland Kitchen ketotifen (ZADITOR) 0.025 % ophthalmic solution Place 1 drop into both eyes daily.    Marland Kitchen lisinopril (ZESTRIL) 10 MG tablet TAKE 1 TABLET TWICE A DAY 180 tablet 1  . prednisoLONE acetate (PRED FORTE) 1 % ophthalmic suspension 1 drop 2 (two) times daily.    . simvastatin (ZOCOR) 40 MG tablet Take 1 tablet (40 mg total) by mouth daily. 90 tablet 1  . traMADol (ULTRAM) 50 MG tablet TAKE 1 TABLET EVERY 6 HOURSAS NEEDED FOR MODERATE PAIN 120 tablet 0   No current facility-administered medications on file prior to visit.    Allergies  Allergen Reactions  . Other Hives and Itching    Fragrances  . Nickel   . Penicillins Rash    Did it involve swelling of the face/tongue/throat, SOB, or low BP? No Did  it involve sudden or severe rash/hives, skin peeling, or any reaction on the inside of your mouth or nose? Yes Did you need to seek medical attention at a hospital or doctor's office? Yes When did it last happen?years ago  If all above answers are "NO", may proceed with cephalosporin use.   . Sulfa Antibiotics Rash    Family History  Problem Relation Age of Onset  . Hearing loss Mother   . Hypertension Mother   . Osteoporosis Mother   . Arthritis Mother   . Cancer Father        Skin  . Hearing loss Father   . Arthritis Father   . Diabetes Brother   . Stroke Brother   . Early death Brother   . Depression Daughter   . Early death Son   . Early death Maternal Grandfather   . Early death Paternal Grandfather     Social History   Socioeconomic History  . Marital status: Divorced    Spouse name: Not on file  . Number of children: Not on file  . Years of education: Not on file  . Highest education level: Not on file  Occupational History  . Occupation: Retired  Tobacco Use  . Smoking status: Never  Smoker  . Smokeless tobacco: Never Used  Vaping Use  . Vaping Use: Never used  Substance and Sexual Activity  . Alcohol use: No  . Drug use: No  . Sexual activity: Not Currently  Other Topics Concern  . Not on file  Social History Narrative  . Not on file   Social Determinants of Health   Financial Resource Strain: Not on file  Food Insecurity: No Food Insecurity  . Worried About Charity fundraiser in the Last Year: Never true  . Ran Out of Food in the Last Year: Never true  Transportation Needs: No Transportation Needs  . Lack of Transportation (Medical): No  . Lack of Transportation (Non-Medical): No  Physical Activity: Not on file  Stress: Not on file  Social Connections: Not on file    Review of Systems - See HPI.  All other ROS are negative.  Temp 97.6 F (36.4 C) (Temporal)   Resp 14   Ht 5\' 1"  (1.549 m)   Wt 113 lb (51.3 kg)   BMI 21.35 kg/m    Physical Exam Vitals reviewed.  Constitutional:      Appearance: Normal appearance.  HENT:     Head: Normocephalic and atraumatic.  Cardiovascular:     Rate and Rhythm: Normal rate and regular rhythm.  Pulmonary:     Effort: Pulmonary effort is normal.  Musculoskeletal:     Cervical back: Neck supple.  Skin:      Neurological:     General: No focal deficit present.     Mental Status: She is alert and oriented to person, place, and time.     Recent Results (from the past 2160 hour(s))  CUP PACEART REMOTE DEVICE CHECK     Status: None   Collection Time: 02/05/20  1:19 AM  Result Value Ref Range   Date Time Interrogation Session [phone]    Pulse Generator Manufacturer MERM    Pulse Gen Model [id number] Azure XT DR MRI    Pulse Gen Serial Number [id number]    Clinic Name [facility]    Implantable Pulse Generator Type Implantable Pulse Generator    Implantable Pulse Generator Implant Date [id number]    Implantable Lead Manufacturer MERM    Implantable Lead Model 5076 CapSureFix Novus MRI SureScan    Implantable Lead Serial Number [id number]    Implantable Lead Implant Date 78242353    Implantable Lead Location Detail 1 APPENDAGE    Implantable Lead Location [id number]    Implantable Lead Manufacturer MERM    Implantable Lead Model 5076 CapSureFix Novus MRI SureScan    Implantable Lead Serial Number [id number]    Implantable Lead Implant Date [id number]    Implantable Lead Location Detail 1 APEX    Implantable Lead Location [phone]    Lead Channel Setting Sensing Sensitivity 5.6 mV   Lead Channel Setting Pacing Amplitude 1.5 V   Lead Channel Setting Pacing Pulse Width 0.4 ms   Lead Channel Setting Pacing Amplitude 2.5 V   Lead Channel Impedance Value 342 ohm   Lead Channel Impedance Value 247 ohm   Lead Channel Sensing Intrinsic Amplitude 1.5 mV   Lead Channel Sensing Intrinsic Amplitude 1.5 mV   Lead Channel Pacing Threshold Amplitude 0.5 V   Lead Channel Pacing  Threshold Pulse Width 0.4 ms   Lead Channel Impedance Value 456 ohm   Lead Channel Impedance Value 323 ohm   Lead Channel Sensing Intrinsic Amplitude 16.25 mV   Lead Channel Sensing Intrinsic Amplitude 16.25 mV  Lead Channel Pacing Threshold Amplitude 0.75 V   Lead Channel Pacing Threshold Pulse Width 0.4 ms   Battery Status OK    Battery Remaining Longevity 125 mo   Battery Voltage 3.03 V   Brady Statistic RA Percent Paced 9.13 %   Brady Statistic RV Percent Paced 99.54 %   Brady Statistic AP VP Percent 8.95 %   Brady Statistic AS VP Percent 90.59 %   Brady Statistic AP VS Percent 0 %   Brady Statistic AS VS Percent 0.46 %    Assessment/Plan: 1. Avulsion of skin With signs of starting mild infection. Wound care reviewed with patient and demonstrated in office. Rx Doxycycline. Supportive measures discussed. Follow-up if not healing as discussed.   2. Age-related osteoporosis without current pathological fracture Overdue for Vitamin D check so we can ensure normal before starting Prolia.  - Vitamin D (25 hydroxy)  This visit occurred during the SARS-CoV-2 public health emergency.  Safety protocols were in place, including screening questions prior to the visit, additional usage of staff PPE, and extensive cleaning of exam room while observing appropriate contact time as indicated for disinfecting solutions.     Leeanne Rio, PA-C

## 2020-04-30 ENCOUNTER — Encounter: Payer: Self-pay | Admitting: Physician Assistant

## 2020-05-01 ENCOUNTER — Other Ambulatory Visit: Payer: Self-pay | Admitting: Emergency Medicine

## 2020-05-01 ENCOUNTER — Encounter: Payer: Self-pay | Admitting: Emergency Medicine

## 2020-05-01 ENCOUNTER — Ambulatory Visit: Payer: Medicare HMO | Admitting: Physician Assistant

## 2020-05-01 DIAGNOSIS — M810 Age-related osteoporosis without current pathological fracture: Secondary | ICD-10-CM

## 2020-05-01 MED ORDER — CITRACAL SLOW RELEASE 600-40-500 MG-MG-UNIT PO TB24: 2.0000 | ORAL_TABLET | Freq: Every day | ORAL | 0 refills | Status: AC

## 2020-05-06 ENCOUNTER — Ambulatory Visit (INDEPENDENT_AMBULATORY_CARE_PROVIDER_SITE_OTHER): Payer: Medicare HMO

## 2020-05-06 ENCOUNTER — Encounter: Payer: Self-pay | Admitting: Physician Assistant

## 2020-05-06 DIAGNOSIS — I442 Atrioventricular block, complete: Secondary | ICD-10-CM | POA: Diagnosis not present

## 2020-05-06 LAB — CUP PACEART REMOTE DEVICE CHECK
Battery Remaining Longevity: 122 mo
Battery Voltage: 3.02 V
Brady Statistic AP VP Percent: 8.66 %
Brady Statistic AP VS Percent: 0 %
Brady Statistic AS VP Percent: 91.03 %
Brady Statistic AS VS Percent: 0.31 %
Brady Statistic RA Percent Paced: 8.77 %
Brady Statistic RV Percent Paced: 99.69 %
Date Time Interrogation Session: 20220131054757
Implantable Lead Implant Date: 20201008
Implantable Lead Implant Date: 20201008
Implantable Lead Location: 753859
Implantable Lead Location: 753860
Implantable Lead Model: 5076
Implantable Lead Model: 5076
Implantable Pulse Generator Implant Date: 20201008
Lead Channel Impedance Value: 247 Ohm
Lead Channel Impedance Value: 323 Ohm
Lead Channel Impedance Value: 323 Ohm
Lead Channel Impedance Value: 456 Ohm
Lead Channel Pacing Threshold Amplitude: 0.5 V
Lead Channel Pacing Threshold Amplitude: 0.75 V
Lead Channel Pacing Threshold Pulse Width: 0.4 ms
Lead Channel Pacing Threshold Pulse Width: 0.4 ms
Lead Channel Sensing Intrinsic Amplitude: 1 mV
Lead Channel Sensing Intrinsic Amplitude: 1 mV
Lead Channel Sensing Intrinsic Amplitude: 16.25 mV
Lead Channel Sensing Intrinsic Amplitude: 16.25 mV
Lead Channel Setting Pacing Amplitude: 1.5 V
Lead Channel Setting Pacing Amplitude: 2.5 V
Lead Channel Setting Pacing Pulse Width: 0.4 ms
Lead Channel Setting Sensing Sensitivity: 5.6 mV

## 2020-05-07 ENCOUNTER — Ambulatory Visit (INDEPENDENT_AMBULATORY_CARE_PROVIDER_SITE_OTHER): Payer: Medicare HMO | Admitting: Physician Assistant

## 2020-05-07 ENCOUNTER — Other Ambulatory Visit: Payer: Self-pay

## 2020-05-07 ENCOUNTER — Encounter: Payer: Self-pay | Admitting: Physician Assistant

## 2020-05-07 VITALS — BP 130/70 | HR 71 | Temp 97.7°F | Resp 14 | Ht 61.0 in | Wt 113.0 lb

## 2020-05-07 DIAGNOSIS — M542 Cervicalgia: Secondary | ICD-10-CM

## 2020-05-07 DIAGNOSIS — Z808 Family history of malignant neoplasm of other organs or systems: Secondary | ICD-10-CM | POA: Diagnosis not present

## 2020-05-07 DIAGNOSIS — G8929 Other chronic pain: Secondary | ICD-10-CM

## 2020-05-07 DIAGNOSIS — I442 Atrioventricular block, complete: Secondary | ICD-10-CM

## 2020-05-07 DIAGNOSIS — M810 Age-related osteoporosis without current pathological fracture: Secondary | ICD-10-CM | POA: Diagnosis not present

## 2020-05-07 DIAGNOSIS — E785 Hyperlipidemia, unspecified: Secondary | ICD-10-CM | POA: Diagnosis not present

## 2020-05-07 DIAGNOSIS — I10 Essential (primary) hypertension: Secondary | ICD-10-CM | POA: Diagnosis not present

## 2020-05-07 NOTE — Patient Instructions (Addendum)
*  Please schedule for Annual Wellness Visit this year with [facility] nurse. *Please schedule for medication recheck with Alyssa Allwardt in 3 months.   *Send MyChart message for refills until next visit.   Someone will call about your referral to dermatology.  Handout provided on Prolia today. Call or send MyChart message if you want to get this started.

## 2020-05-07 NOTE — Progress Notes (Signed)
   Subjective:    Patient ID: Bethany Carpenter, female    DOB: February 27, 1934, 87 y.o.   MRN: [id number]  HPI  87 yo pleasant female presenting today for transfer of care.  Chronic problems:  Chronic neck pain - She broke her neck some years ago and had about 5 hours of surgery. She continues to have some pain, but this has been controlled with Tramadol.  Osteoporosis - Takes Ensure every morning and is going to pick up Citracal Calcium + D today. She was talking to previous provider about starting on Prolia, but would like more information first.   Essential HTN - Lisinopril 10 mg and Coreg 12.5 mg; stable on this medication; monitors daily  Hyperlipidemia - Stable on 40 mg daily.  Complete Heart Block - Pacemaker in place 01/12/2019, Dr. Rayann Heman is cardiologist.   History of skin cancer - Needing more places removed she says. Last done in [place]. She needs new dermatologist here.   Review of Systems  Constitutional: Negative for activity change, appetite change and unexpected weight change.  Eyes: Negative for discharge.  Respiratory: Negative for shortness of breath.   Cardiovascular: Negative for chest pain, palpitations and leg swelling.  Musculoskeletal: Positive for arthralgias, back pain and neck pain.  Skin:       CHANGING MOLES   Neurological: Negative for dizziness and headaches.  Psychiatric/Behavioral: Negative for confusion.       Objective:   Physical Exam Constitutional:      General: She is not in acute distress.    Appearance: Normal appearance. She is normal weight.  HENT:     Head: Normocephalic.     Nose: Nose normal.  Eyes:     Pupils: Pupils are equal, round, and reactive to light.  Cardiovascular:     Rate and Rhythm: Normal rate.     Pulses: Normal pulses.     Comments: PACED Pulmonary:     Effort: Pulmonary effort is normal.     Breath sounds: Normal breath sounds.  Musculoskeletal:     Cervical back: Rigidity present.  Skin:       Neurological:     Mental Status: She is alert.  Psychiatric:        Mood and Affect: Mood normal.        Behavior: Behavior normal.        Thought Content: Thought content normal.        Judgment: Judgment normal.     Vitals with BMI 05/07/2020 04/29/2020 11/07/2019  Height 5\' 1"  5\' 1"  5\' 1"   Weight 113 lbs 113 lbs 109 lbs  BMI 21.36 42.70 62.37  Systolic 628 315 176  Diastolic 70 70 70  Pulse 71 73 69       Assessment & Plan:   Chronic neck pain - She is set on refills for Tramadol at this time. Updated pain contract today. Takes 50 mg 1 tab po q6hours. Recheck meds in 3 months.  Osteoporosis - Handout on Prolia. She will call about scheduling this if she desires.  Essential HTN - Stable on Lisinopril 10 mg QD and Coreg 12.5 mg BID. Monitor daily.  Hyperlipidemia - Stable on Simvastatin 40 mg daily.  Complete Heart Block - Will cont f/up with Dr. Rayann Heman.   History of skin cancer - Referral to dermatologist sent today.

## 2020-05-15 NOTE — Progress Notes (Signed)
Remote pacemaker transmission.   

## 2020-05-22 ENCOUNTER — Other Ambulatory Visit: Payer: Self-pay | Admitting: Physician Assistant

## 2020-05-22 ENCOUNTER — Encounter: Payer: Self-pay | Admitting: Physician Assistant

## 2020-05-22 DIAGNOSIS — M542 Cervicalgia: Secondary | ICD-10-CM

## 2020-05-23 ENCOUNTER — Other Ambulatory Visit: Payer: Self-pay | Admitting: Physician Assistant

## 2020-05-23 ENCOUNTER — Telehealth: Payer: Self-pay

## 2020-05-23 DIAGNOSIS — M542 Cervicalgia: Secondary | ICD-10-CM

## 2020-05-23 DIAGNOSIS — I10 Essential (primary) hypertension: Secondary | ICD-10-CM

## 2020-05-23 MED ORDER — TRAMADOL HCL 50 MG PO TABS
ORAL_TABLET | ORAL | 0 refills | Status: DC
Start: 2020-05-23 — End: 2020-06-21

## 2020-05-23 MED ORDER — CARVEDILOL 12.5 MG PO TABS
12.5000 mg | ORAL_TABLET | Freq: Two times a day (BID) | ORAL | 0 refills | Status: DC
Start: 2020-05-23 — End: 2020-08-27

## 2020-05-23 NOTE — Telephone Encounter (Signed)
..   LAST APPOINTMENT DATE: 05/07/2020   NEXT APPOINTMENT DATE:@4 /02/2021  MEDICATION:carvedilol (COREG) 12.5 MG tablet lisinopril (ZESTRIL) 10 MG tablet     PHARMACY: [facility]

## 2020-05-23 NOTE — Telephone Encounter (Signed)
I sent the Coreg. Please confirm dosing on the Zestril and then please send in 90 day refill.

## 2020-05-24 ENCOUNTER — Other Ambulatory Visit: Payer: Self-pay

## 2020-05-24 DIAGNOSIS — I10 Essential (primary) hypertension: Secondary | ICD-10-CM

## 2020-05-24 MED ORDER — LISINOPRIL 10 MG PO TABS: 10.0000 mg | ORAL_TABLET | Freq: Two times a day (BID) | ORAL | 1 refills | Status: DC

## 2020-05-24 MED ORDER — LISINOPRIL 10 MG PO TABS: 10.0000 mg | ORAL_TABLET | Freq: Two times a day (BID) | ORAL | 0 refills | Status: DC

## 2020-05-24 NOTE — Telephone Encounter (Signed)
Verified Rx sent to the pharmacy.

## 2020-06-18 ENCOUNTER — Other Ambulatory Visit: Payer: Self-pay | Admitting: Physician Assistant

## 2020-06-18 DIAGNOSIS — M542 Cervicalgia: Secondary | ICD-10-CM

## 2020-06-21 ENCOUNTER — Other Ambulatory Visit: Payer: Self-pay | Admitting: Physician Assistant

## 2020-06-21 ENCOUNTER — Encounter: Payer: Self-pay | Admitting: Physician Assistant

## 2020-06-21 DIAGNOSIS — M542 Cervicalgia: Secondary | ICD-10-CM

## 2020-06-21 MED ORDER — TRAMADOL HCL 50 MG PO TABS: ORAL_TABLET | ORAL | 0 refills | Status: DC

## 2020-07-02 NOTE — Telephone Encounter (Signed)
Rx Request 

## 2020-07-15 ENCOUNTER — Ambulatory Visit (INDEPENDENT_AMBULATORY_CARE_PROVIDER_SITE_OTHER): Payer: Medicare HMO | Admitting: Physician Assistant

## 2020-07-15 ENCOUNTER — Encounter: Payer: Self-pay | Admitting: Physician Assistant

## 2020-07-15 ENCOUNTER — Other Ambulatory Visit: Payer: Self-pay

## 2020-07-15 VITALS — BP 126/64 | HR 49 | Temp 97.0°F | Ht 61.0 in | Wt 110.0 lb

## 2020-07-15 DIAGNOSIS — G8929 Other chronic pain: Secondary | ICD-10-CM

## 2020-07-15 DIAGNOSIS — M542 Cervicalgia: Secondary | ICD-10-CM

## 2020-07-15 NOTE — Patient Instructions (Signed)
Good to see you again today! I am glad you are doing well. Please call when refills become due. Another provider will be able to fill while I'm on maternity leave. I will see you back in 3 months for you next medication recheck.

## 2020-07-15 NOTE — Progress Notes (Signed)
Established Patient Office Visit  Subjective:  Patient ID: Bethany Carpenter, female    DOB: 11/20/1933  Age: 87 y.o. MRN: [id number]  CC:  Chief Complaint  Patient presents with  . Medication Refill    HPI Bethany Carpenter presents for chronic pain management encounter. She has a hx of cervical spine surgery in 2014 (fusion of C2 through T3) and still has pain in her neck. Average daily pain 4/10, occasionally more so and will use a heating pad buddy. Takes Tramadol 50 mg QID, which does help keep her pain level stable.   Past Medical History:  Diagnosis Date  . Allergy   . Arthritis   . Cancer (HCC)    Skin  . Colon polyps   . Heart disease   . History of chickenpox   . Hyperlipidemia   . Hypertension   . Presence of permanent cardiac pacemaker 01/12/2019    Past Surgical History:  Procedure Laterality Date  . ANGIOPLASTY  03/08/2007  . BREAST BIOPSY  09/28/2003  . CATARACT EXTRACTION  11/25/2005   04/07/2006  . CERVICAL SPINE SURGERY  11/24/2012   02/02/2008  . INSERT / REPLACE / REMOVE PACEMAKER  01/12/2019  . PACEMAKER IMPLANT N/A 01/12/2019   Procedure: PACEMAKER IMPLANT;  Surgeon: Thompson Grayer, MD;  Location: [facility];  Service: Cardiovascular;  Laterality: N/A;  . TONSILLECTOMY  1952  . TUBAL LIGATION  1970    Family History  Problem Relation Age of Onset  . Hearing loss Mother   . Hypertension Mother   . Osteoporosis Mother   . Arthritis Mother   . Cancer Father        Skin  . Hearing loss Father   . Arthritis Father   . Diabetes Brother   . Stroke Brother   . Early death Brother   . Depression Daughter   . Early death Son   . Early death Maternal Grandfather   . Early death Paternal Grandfather     Social History   Socioeconomic History  . Marital status: Divorced    Spouse name: Not on file  . Number of children: Not on file  . Years of education: Not on file  . Highest education level: Not on file  Occupational History  .  Occupation: Retired  Tobacco Use  . Smoking status: Never Smoker  . Smokeless tobacco: Never Used  Vaping Use  . Vaping Use: Never used  Substance and Sexual Activity  . Alcohol use: No  . Drug use: No  . Sexual activity: Not Currently  Other Topics Concern  . Not on file  Social History Narrative  . Not on file   Social Determinants of Health   Financial Resource Strain: Not on file  Food Insecurity: No Food Insecurity  . Worried About Charity fundraiser in the Last Year: Never true  . Ran Out of Food in the Last Year: Never true  Transportation Needs: No Transportation Needs  . Lack of Transportation (Medical): No  . Lack of Transportation (Non-Medical): No  Physical Activity: Not on file  Stress: Not on file  Social Connections: Not on file  Intimate Partner Violence: Not on file    Outpatient Medications Prior to Visit  Medication Sig Dispense Refill  . aspirin 81 MG EC tablet Take 81 mg by mouth daily.     . Calcium-Magnesium-Vitamin D (CITRACAL CALCIUM+D) 600-40-500 MG-MG-UNIT TB24 Take 2 tablets by mouth daily. 60 tablet 0  . carvedilol (COREG) 12.5 MG tablet  Take 1 tablet (12.5 mg total) by mouth 2 (two) times daily with a meal. 180 tablet 0  . fluticasone (FLONASE) 50 MCG/ACT nasal spray Place 1 spray into both nostrils daily.    Marland Kitchen lisinopril (ZESTRIL) 10 MG tablet Take 1 tablet (10 mg total) by mouth 2 (two) times daily. 180 tablet 0  . prednisoLONE acetate (PRED FORTE) 1 % ophthalmic suspension 1 drop 2 (two) times daily.    . simvastatin (ZOCOR) 40 MG tablet Take 1 tablet (40 mg total) by mouth daily. 90 tablet 1  . traMADol (ULTRAM) 50 MG tablet TAKE 1 TABLET EVERY 6 HOURSAS NEEDED FOR MODERATE PAIN 120 tablet 0  . ketotifen (ZADITOR) 0.025 % ophthalmic solution Place 1 drop into both eyes daily.     No facility-administered medications prior to visit.    Allergies  Allergen Reactions  . Other Hives and Itching    Fragrances  . Nickel   . Penicillins  Rash    Did it involve swelling of the face/tongue/throat, SOB, or low BP? No Did it involve sudden or severe rash/hives, skin peeling, or any reaction on the inside of your mouth or nose? Yes Did you need to seek medical attention at a hospital or doctor's office? Yes When did it last happen?years ago  If all above answers are "NO", may proceed with cephalosporin use.   . Sulfa Antibiotics Rash    ROS Review of Systems  Constitutional: Negative for activity change, appetite change and unexpected weight change.  Eyes: Negative for discharge.  Respiratory: Negative for shortness of breath.   Cardiovascular: Negative for chest pain, palpitations and leg swelling.  Musculoskeletal: Positive for arthralgias, back pain and neck pain.  Neurological: Negative for dizziness and headaches.  Psychiatric/Behavioral: Negative for confusion.      Objective:    Physical Exam Vitals and nursing note reviewed.  Constitutional:      General: She is not in acute distress.    Appearance: Normal appearance. She is not ill-appearing.  HENT:     Head: Normocephalic.  Neck:     Vascular: No carotid bruit.  Cardiovascular:     Rate and Rhythm: Bradycardia present.  Pulmonary:     Effort: Pulmonary effort is normal.     Breath sounds: Normal breath sounds.  Musculoskeletal:     Cervical back: Rigidity (chronic pain and spasm) present. No tenderness.  Lymphadenopathy:     Cervical: No cervical adenopathy.  Skin:    General: Skin is warm.  Neurological:     Mental Status: She is alert.  Psychiatric:        Mood and Affect: Mood normal.        Behavior: Behavior normal.     BP 126/64   Pulse (!) 49   Temp (!) 97 F (36.1 C)   Ht 5\' 1"  (1.549 m)   Wt 110 lb (49.9 kg)   SpO2 97%   BMI 20.78 kg/m  Wt Readings from Last 3 Encounters:  07/15/20 110 lb (49.9 kg)  05/07/20 113 lb (51.3 kg)  04/29/20 113 lb (51.3 kg)       Assessment & Plan:   Problem List Items Addressed  This Visit      Other   Chronic neck pain    Other Visit Diagnoses    Cervicalgia    -  Primary   Encounter for chronic pain management          No orders of the defined types were placed in this  encounter.   Follow-up: Return in about 3 months (around 10/14/2020) for med check.   1. Cervicalgia 2. Chronic neck pain 3. Encounter for chronic pain management PDMP reviewed - compliant, no discrepancies. Her pain is controlled and stable with Tramadol 50 mg QID. Next refill due on 07/21/20. She will call for refill request. Continue regular f/up with me every 3 months.   Denica Web M Welborn Keena, PA-C

## 2020-07-19 ENCOUNTER — Encounter: Payer: Self-pay | Admitting: Physician Assistant

## 2020-07-22 ENCOUNTER — Other Ambulatory Visit: Payer: Self-pay | Admitting: Physician Assistant

## 2020-07-22 ENCOUNTER — Other Ambulatory Visit: Payer: Self-pay

## 2020-07-22 DIAGNOSIS — M542 Cervicalgia: Secondary | ICD-10-CM

## 2020-07-22 NOTE — Telephone Encounter (Signed)
Rx request 

## 2020-08-05 ENCOUNTER — Ambulatory Visit (INDEPENDENT_AMBULATORY_CARE_PROVIDER_SITE_OTHER): Payer: Medicare HMO

## 2020-08-05 DIAGNOSIS — I442 Atrioventricular block, complete: Secondary | ICD-10-CM

## 2020-08-06 LAB — CUP PACEART REMOTE DEVICE CHECK
Battery Remaining Longevity: 118 mo
Battery Voltage: 3.01 V
Brady Statistic AP VP Percent: 7.01 %
Brady Statistic AP VS Percent: 0 %
Brady Statistic AS VP Percent: 92.9 %
Brady Statistic AS VS Percent: 0.09 %
Brady Statistic RA Percent Paced: 7.02 %
Brady Statistic RV Percent Paced: 99.91 %
Date Time Interrogation Session: 20220502044557
Implantable Lead Implant Date: 20201008
Implantable Lead Implant Date: 20201008
Implantable Lead Location: 753859
Implantable Lead Location: 753860
Implantable Lead Model: 5076
Implantable Lead Model: 5076
Implantable Pulse Generator Implant Date: 20201008
Lead Channel Impedance Value: 247 Ohm
Lead Channel Impedance Value: 323 Ohm
Lead Channel Impedance Value: 342 Ohm
Lead Channel Impedance Value: 437 Ohm
Lead Channel Pacing Threshold Amplitude: 0.5 V
Lead Channel Pacing Threshold Amplitude: 0.625 V
Lead Channel Pacing Threshold Pulse Width: 0.4 ms
Lead Channel Pacing Threshold Pulse Width: 0.4 ms
Lead Channel Sensing Intrinsic Amplitude: 1.25 mV
Lead Channel Sensing Intrinsic Amplitude: 1.25 mV
Lead Channel Sensing Intrinsic Amplitude: 16.25 mV
Lead Channel Sensing Intrinsic Amplitude: 16.25 mV
Lead Channel Setting Pacing Amplitude: 1.5 V
Lead Channel Setting Pacing Amplitude: 2.5 V
Lead Channel Setting Pacing Pulse Width: 0.4 ms
Lead Channel Setting Sensing Sensitivity: 5.6 mV

## 2020-08-20 ENCOUNTER — Encounter: Payer: Self-pay | Admitting: Physician Assistant

## 2020-08-20 DIAGNOSIS — M542 Cervicalgia: Secondary | ICD-10-CM

## 2020-08-20 MED ORDER — TRAMADOL HCL 50 MG PO TABS: ORAL_TABLET | ORAL | 1 refills | Status: DC

## 2020-08-20 NOTE — Telephone Encounter (Signed)
I refilled this 

## 2020-08-20 NOTE — Telephone Encounter (Signed)
Pt requesting refill for Tramadol 50 mg.

## 2020-08-24 ENCOUNTER — Encounter: Payer: Self-pay | Admitting: Physician Assistant

## 2020-08-24 DIAGNOSIS — I10 Essential (primary) hypertension: Secondary | ICD-10-CM

## 2020-08-26 NOTE — Progress Notes (Signed)
Remote pacemaker transmission.   

## 2020-08-27 MED ORDER — CARVEDILOL 12.5 MG PO TABS: 12.5000 mg | ORAL_TABLET | Freq: Two times a day (BID) | ORAL | 1 refills | Status: DC

## 2020-08-29 ENCOUNTER — Other Ambulatory Visit: Payer: Self-pay

## 2020-08-29 ENCOUNTER — Ambulatory Visit (INDEPENDENT_AMBULATORY_CARE_PROVIDER_SITE_OTHER): Payer: Medicare HMO | Admitting: Family Medicine

## 2020-08-29 ENCOUNTER — Encounter: Payer: Self-pay | Admitting: Family Medicine

## 2020-08-29 VITALS — BP 120/58 | HR 79 | Temp 98.2°F | Ht 61.0 in | Wt 110.4 lb

## 2020-08-29 DIAGNOSIS — L255 Unspecified contact dermatitis due to plants, except food: Secondary | ICD-10-CM | POA: Diagnosis not present

## 2020-08-29 MED ORDER — TRIAMCINOLONE ACETONIDE 0.1 % EX CREA
1.0000 | TOPICAL_CREAM | Freq: Two times a day (BID) | CUTANEOUS | 0 refills | Status: DC
Start: 2020-08-29 — End: 2022-01-23

## 2020-08-29 NOTE — Patient Instructions (Addendum)
Trial triamcinolone for up to 10 days to reduce the itch  It is certainly possible you had several bug bites or you could have also come in contact with something you are sensitive to such as poison ivy-the treatment should be designed for both  If you have new or worsening symptoms or progressive redness going up the leg in particular or fever please return to see [place] as soon as possible  Recommended follow up: Return for as needed for new, worsening, persistent symptoms.

## 2020-08-29 NOTE — Progress Notes (Signed)
Phone [phone] In person visit   Subjective:   Bethany Carpenter is a 87 y.o. year old very pleasant female patient who presents for/with See problem oriented charting Chief Complaint  Patient presents with  . Insect Bite    Patient had an allergic reaction to a bug bite about a week ago. Right leg is very red and very itchy.    This visit occurred during the SARS-CoV-2 public health emergency.  Safety protocols were in place, including screening questions prior to the visit, additional usage of staff PPE, and extensive cleaning of exam room while observing appropriate contact time as indicated for disinfecting solutions.   Past Medical History-  Patient Active Problem List   Diagnosis Date Noted  . Osteoporosis 05/07/2020  . CHB (complete heart block) ([place]) 01/12/2019  . Essential hypertension 05/12/2017  . Hyperlipidemia 05/12/2017  . Chronic neck pain 05/12/2017  . Chronic low back pain without sciatica 05/12/2017  . Seasonal allergic rhinitis 05/12/2017  . Skin cancer 09/16/2016    Medications- reviewed and updated Current Outpatient Medications  Medication Sig Dispense Refill  . aspirin 81 MG EC tablet Take 81 mg by mouth daily.     . Calcium-Magnesium-Vitamin D (CITRACAL CALCIUM+D) 600-40-500 MG-MG-UNIT TB24 Take 2 tablets by mouth daily. 60 tablet 0  . carvedilol (COREG) 12.5 MG tablet Take 1 tablet (12.5 mg total) by mouth 2 (two) times daily with a meal. 180 tablet 1  . fluticasone (FLONASE) 50 MCG/ACT nasal spray Place 1 spray into both nostrils daily.    Marland Kitchen lisinopril (ZESTRIL) 10 MG tablet Take 1 tablet (10 mg total) by mouth 2 (two) times daily. 180 tablet 0  . prednisoLONE acetate (PRED FORTE) 1 % ophthalmic suspension 1 drop 2 (two) times daily.    . simvastatin (ZOCOR) 40 MG tablet Take 1 tablet (40 mg total) by mouth daily. 90 tablet 1  . traMADol (ULTRAM) 50 MG tablet TAKE 1 TABLET EVERY 6 HOURSAS NEEDED FOR MODERATE PAIN 120 tablet 1  . triamcinolone cream  (KENALOG) 0.1 % Apply 1 application topically 2 (two) times daily. For 7-10 days maximum 80 g 0   No current facility-administered medications for this visit.     Objective:  BP (!) 120/58   Pulse 79   Temp 98.2 F (36.8 C) (Temporal)   Ht 5\' 1"  (1.549 m)   Wt 110 lb 6.4 oz (50.1 kg)   SpO2 99%   BMI 20.86 kg/m  Gen: NAD, resting comfortably CV: RRR no murmurs rubs or gallops Lungs: CTAB no crackles, wheeze, rhonchi Ext: no edema. On the right leg-lower portion of the leg there is a 7x4 cm area of erythema on medial portion with multiple small vesicles and scattered vesicles close above this and with a linear streak on the lower portion laterally and on the left leg there is also some smaller vesicles. Skin: warm, dry     Assessment and Plan  #  Right lower leg-possible allergic Reaction to Insect Bites per patient S: She was completing yard work with shorts on a week ago. Reports swelling and itching in her legs sometime after coming in from yardwork. Used hydrocortisone cream,  antibiotic ointment, and  ice packs- she says swelling is down. Visible on right leg, and not sure of what "bit her. visible fluid filled vesicles.  She states she has never had poison ivy or other allergic reaction in her yard but she does state that is possibility.  No expanding redness, warmth.  Area is very  pruritic.  No fever, chills, or body aches.  Has not felt systemically ill-her neck does ache with baseline arthritis/prior surgery but this is typically worse with the rain/changes in A/P: Area with multiple small vesicles on right leg and then a few smaller vesicles on the left leg-I wonder if this could be a contact dermatitis from a plant such as poison ivy.  We discussed possible systemic therapy but area is rather small and she would prefer topical agents.  He opted to try triamcinolone cream.  Warning precautions as per AVS  Recommended follow up: Return for as needed for new, worsening, persistent  symptoms. Future Appointments  Date Time Provider [place]  10/14/2020 10:40 AM Vivi Barrack, MD [facility]  11/04/2020 10:35 AM [facility] DEVICE REMOTES [facility]  02/03/2021 10:35 AM [facility] DEVICE REMOTES [facility]  05/05/2021 10:35 AM [facility] DEVICE REMOTES [facility]  08/04/2021 10:35 AM [facility] DEVICE REMOTES [facility]  11/03/2021 10:35 AM [facility] DEVICE REMOTES [facility]   Lab/Order associations:   ICD-10-CM   1. Contact dermatitis due to plant  L25.5     Meds ordered this encounter  Medications  . triamcinolone cream (KENALOG) 0.1 %    Sig: Apply 1 application topically 2 (two) times daily. For 7-10 days maximum    Dispense:  80 g    Refill:  0    Time Spent: 24 minutes of total time (8:02 AM- 8:26 AM) was spent on the date of the encounter performing the following actions: chart review prior to seeing the patient, obtaining history, performing a medically necessary exam, counseling on the treatment plan, placing orders, and documenting/reviewing documentation in our EHR.   I,Harris Phan,acting as a Education administrator for Garret Reddish, MD.,have documented all relevant documentation on the behalf of Garret Reddish, MD,as directed by  Garret Reddish, MD while in the presence of Garret Reddish, MD.   I, Garret Reddish, MD, have reviewed all documentation for this visit. The documentation on 08/29/20 for the exam, diagnosis, procedures, and orders are all accurate and complete.  Return precautions advised.   Garret Reddish, MD

## 2020-10-02 ENCOUNTER — Encounter: Payer: Self-pay | Admitting: Physician Assistant

## 2020-10-02 DIAGNOSIS — E785 Hyperlipidemia, unspecified: Secondary | ICD-10-CM

## 2020-10-02 MED ORDER — SIMVASTATIN 40 MG PO TABS: 40.0000 mg | ORAL_TABLET | Freq: Every day | ORAL | 1 refills | Status: DC

## 2020-10-14 ENCOUNTER — Ambulatory Visit: Payer: Medicare HMO | Admitting: Family Medicine

## 2020-10-14 ENCOUNTER — Ambulatory Visit (INDEPENDENT_AMBULATORY_CARE_PROVIDER_SITE_OTHER): Payer: Medicare HMO | Admitting: Physician Assistant

## 2020-10-14 ENCOUNTER — Other Ambulatory Visit: Payer: Self-pay

## 2020-10-14 VITALS — BP 155/77 | HR 70 | Temp 97.9°F | Ht 61.0 in | Wt 110.4 lb

## 2020-10-14 DIAGNOSIS — M25512 Pain in left shoulder: Secondary | ICD-10-CM

## 2020-10-14 DIAGNOSIS — I10 Essential (primary) hypertension: Secondary | ICD-10-CM

## 2020-10-14 DIAGNOSIS — M542 Cervicalgia: Secondary | ICD-10-CM

## 2020-10-14 DIAGNOSIS — G8929 Other chronic pain: Secondary | ICD-10-CM | POA: Diagnosis not present

## 2020-10-14 DIAGNOSIS — Z950 Presence of cardiac pacemaker: Secondary | ICD-10-CM

## 2020-10-14 NOTE — Patient Instructions (Signed)
Call for refills.  Someone will call with PT referral and cardiology referral.  Let me know if your BP is staying elevated.

## 2020-10-14 NOTE — Progress Notes (Signed)
Established Patient Office Visit  Subjective:  Patient ID: Bethany Carpenter, female    DOB: [birth year]  Age: 87 y.o. MRN: [id number]  CC:  Chief Complaint  Patient presents with   Medication Check    HPI Bethany Carpenter presents for chronic pain management encounter. She has a hx of cervical spine surgery in 2014 (fusion of C2 through T3) and still has pain in her neck. Average daily pain 4/10, occasionally more so and will use a heating pad buddy. Takes Tramadol 50 mg QID, which does help keep her pain level stable. She denies any changes in her chronic pain.  She does note that she slipped and fell earlier this year and feels like she pulled her left shoulder. She is still having some limitation with external rotation and would like help with physical therapy.  Pt is also requesting referral to new cardiologist, as receptionist at current office has not been pleasant with her per patient.   Past Medical History:  Diagnosis Date   Allergy    Arthritis    Cancer ([place])    Skin   Colon polyps    Heart disease    History of chickenpox    Hyperlipidemia    Hypertension    Presence of permanent cardiac pacemaker 01/12/2019    Past Surgical History:  Procedure Laterality Date   ANGIOPLASTY  03/08/2007   BREAST BIOPSY  09/28/2003   CATARACT EXTRACTION  11/25/2005   04/07/2006   CERVICAL SPINE SURGERY  11/24/2012   02/02/2008   INSERT / REPLACE / REMOVE PACEMAKER  01/12/2019   PACEMAKER IMPLANT N/A 01/12/2019   Procedure: PACEMAKER IMPLANT;  Surgeon: Thompson Grayer, MD;  Location: [facility];  Service: Cardiovascular;  Laterality: N/A;   TONSILLECTOMY  1952   TUBAL LIGATION  1970    Family History  Problem Relation Age of Onset   Hearing loss Mother    Hypertension Mother    Osteoporosis Mother    Arthritis Mother    Cancer Father        Skin   Hearing loss Father    Arthritis Father    Diabetes Brother    Stroke Brother    Early death Brother    Depression  Daughter    Early death Son    Early death Maternal Grandfather    Early death Paternal Grandfather     Social History   Socioeconomic History   Marital status: Divorced    Spouse name: Not on file   Number of children: Not on file   Years of education: Not on file   Highest education level: Not on file  Occupational History   Occupation: Retired  Tobacco Use   Smoking status: Never   Smokeless tobacco: Never  Vaping Use   Vaping Use: Never used  Substance and Sexual Activity   Alcohol use: No   Drug use: No   Sexual activity: Not Currently  Other Topics Concern   Not on file  Social History Narrative   Not on file   Social Determinants of Health   Financial Resource Strain: Not on file  Food Insecurity: Not on file  Transportation Needs: Not on file  Physical Activity: Not on file  Stress: Not on file  Social Connections: Not on file  Intimate Partner Violence: Not on file    Outpatient Medications Prior to Visit  Medication Sig Dispense Refill   aspirin 81 MG EC tablet Take 81 mg by mouth daily.  Calcium-Magnesium-Vitamin D (CITRACAL CALCIUM+D) 600-40-500 MG-MG-UNIT TB24 Take 2 tablets by mouth daily. 60 tablet 0   carvedilol (COREG) 12.5 MG tablet Take 1 tablet (12.5 mg total) by mouth 2 (two) times daily with a meal. 180 tablet 1   fluticasone (FLONASE) 50 MCG/ACT nasal spray Place 1 spray into both nostrils daily.     lisinopril (ZESTRIL) 10 MG tablet Take 1 tablet (10 mg total) by mouth 2 (two) times daily. 180 tablet 0   prednisoLONE acetate (PRED FORTE) 1 % ophthalmic suspension 1 drop 2 (two) times daily.     simvastatin (ZOCOR) 40 MG tablet Take 1 tablet (40 mg total) by mouth daily. 90 tablet 1   traMADol (ULTRAM) 50 MG tablet TAKE 1 TABLET EVERY 6 HOURSAS NEEDED FOR MODERATE PAIN 120 tablet 1   triamcinolone cream (KENALOG) 0.1 % Apply 1 application topically 2 (two) times daily. For 7-10 days maximum 80 g 0   No facility-administered medications  prior to visit.    Allergies  Allergen Reactions   Other Hives and Itching    Fragrances   Nickel    Penicillins Rash    Did it involve swelling of the face/tongue/throat, SOB, or low BP? No Did it involve sudden or severe rash/hives, skin peeling, or any reaction on the inside of your mouth or nose? Yes Did you need to seek medical attention at a hospital or doctor's office? Yes When did it last happen?      years ago  If all above answers are "NO", may proceed with cephalosporin use.    Sulfa Antibiotics Rash    ROS Review of Systems  Constitutional:  Negative for activity change, appetite change and unexpected weight change.  Eyes:  Negative for discharge.  Respiratory:  Negative for shortness of breath.   Cardiovascular:  Negative for chest pain, palpitations and leg swelling.  Musculoskeletal:  Positive for arthralgias, back pain and neck pain.  Neurological:  Negative for dizziness and headaches.  Psychiatric/Behavioral:  Negative for confusion.      Objective:    Physical Exam Vitals and nursing note reviewed.  Constitutional:      General: She is not in acute distress.    Appearance: Normal appearance. She is not ill-appearing.  HENT:     Head: Normocephalic.  Neck:     Vascular: No carotid bruit.  Cardiovascular:     Rate and Rhythm: Normal rate and regular rhythm.  Pulmonary:     Effort: Pulmonary effort is normal.     Breath sounds: Normal breath sounds.  Musculoskeletal:     Left shoulder: Decreased range of motion (external rotation limited).     Cervical back: Rigidity (chronic pain and spasm) present. No tenderness.  Lymphadenopathy:     Cervical: No cervical adenopathy.  Skin:    General: Skin is warm.  Neurological:     Mental Status: She is alert.  Psychiatric:        Mood and Affect: Mood normal.        Behavior: Behavior normal.    BP (!) 155/77   Pulse 70   Temp 97.9 F (36.6 C)   Ht 5\' 1"  (1.549 m)   Wt 110 lb 6.1 oz (50.1 kg)    SpO2 98%   BMI 20.86 kg/m  Wt Readings from Last 3 Encounters:  10/14/20 110 lb 6.1 oz (50.1 kg)  08/29/20 110 lb 6.4 oz (50.1 kg)  07/15/20 110 lb (49.9 kg)     Health Maintenance Due  Topic  Date Due   Zoster Vaccines- Shingrix (1 of 2) Never done    There are no preventive care reminders to display for this patient.  Lab Results  Component Value Date   TSH 2.36 01/10/2019   Lab Results  Component Value Date   WBC 7.0 01/12/2019   HGB 11.6 (L) 01/12/2019   HCT 35.6 (L) 01/12/2019   MCV 91.8 01/12/2019   PLT 181 01/12/2019   Lab Results  Component Value Date   NA 137 01/17/2019   K 4.5 01/17/2019   CO2 28 01/17/2019   GLUCOSE 102 (H) 01/17/2019   BUN 21 01/17/2019   CREATININE 0.78 01/17/2019   BILITOT 0.3 05/02/2018   ALKPHOS 47 05/02/2018   AST 30 05/02/2018   ALT 19 05/02/2018   PROT 6.5 05/02/2018   ALBUMIN 3.9 05/02/2018   CALCIUM 8.8 01/17/2019   ANIONGAP 10 01/12/2019   GFR 70.55 01/17/2019   Lab Results  Component Value Date   CHOL 119 05/02/2018   Lab Results  Component Value Date   HDL 56.70 05/02/2018   Lab Results  Component Value Date   LDLCALC 49 05/02/2018   Lab Results  Component Value Date   TRIG 70.0 05/02/2018   Lab Results  Component Value Date   CHOLHDL 2 05/02/2018   No results found for: HGBA1C    Assessment & Plan:   Problem List Items Addressed This Visit       Cardiovascular and Mediastinum   Essential hypertension   Relevant Orders   Ambulatory referral to Cardiology     Other   Chronic neck pain   Other Visit Diagnoses     Cervicalgia    -  Primary   Encounter for chronic pain management       Pain of left shoulder with external rotation       Relevant Orders   Ambulatory referral to Physical Therapy   Presence of cardiac pacemaker       Relevant Orders   Ambulatory referral to Cardiology       No orders of the defined types were placed in this encounter.   Follow-up: Return in about 3  months (around 01/14/2021) for med check .   1. Cervicalgia 2. Chronic neck pain 3. Encounter for chronic pain management PDMP reviewed Stable on Tramadol 50 mg QID Not due for refill yet  4. Pain of left shoulder with external rotation Likely some rotator involvement. I am agreeable with PT at this time to help her with ROM.  5. Presence of cardiac pacemaker 6. Essential hypertension Referral to new cardiologist per pt request. Elevated BP today. She is tearful and anxious about passing of a friend's sister. Informed her to monitor at home and update me if this is staying elevated at all. Goal 130s/80s.    Raynaldo Falco M Aisea Bouldin, PA-C

## 2020-10-17 ENCOUNTER — Other Ambulatory Visit: Payer: Self-pay | Admitting: Family Medicine

## 2020-10-17 DIAGNOSIS — M542 Cervicalgia: Secondary | ICD-10-CM

## 2020-10-28 ENCOUNTER — Ambulatory Visit: Payer: Medicare HMO | Admitting: Physical Therapy

## 2020-10-31 ENCOUNTER — Encounter: Payer: Self-pay | Admitting: *Deleted

## 2020-11-04 ENCOUNTER — Ambulatory Visit (INDEPENDENT_AMBULATORY_CARE_PROVIDER_SITE_OTHER): Payer: Medicare HMO

## 2020-11-04 DIAGNOSIS — I442 Atrioventricular block, complete: Secondary | ICD-10-CM

## 2020-11-04 LAB — CUP PACEART REMOTE DEVICE CHECK
Battery Remaining Longevity: 116 mo
Battery Voltage: 3.01 V
Brady Statistic AP VP Percent: 9.14 %
Brady Statistic AP VS Percent: 0 %
Brady Statistic AS VP Percent: 90.79 %
Brady Statistic AS VS Percent: 0.06 %
Brady Statistic RA Percent Paced: 9.15 %
Brady Statistic RV Percent Paced: 99.94 %
Date Time Interrogation Session: 20220801052618
Implantable Lead Implant Date: 20201008
Implantable Lead Implant Date: 20201008
Implantable Lead Location: 753859
Implantable Lead Location: 753860
Implantable Lead Model: 5076
Implantable Lead Model: 5076
Implantable Pulse Generator Implant Date: 20201008
Lead Channel Impedance Value: 266 Ohm
Lead Channel Impedance Value: 342 Ohm
Lead Channel Impedance Value: 361 Ohm
Lead Channel Impedance Value: 456 Ohm
Lead Channel Pacing Threshold Amplitude: 0.5 V
Lead Channel Pacing Threshold Amplitude: 0.75 V
Lead Channel Pacing Threshold Pulse Width: 0.4 ms
Lead Channel Pacing Threshold Pulse Width: 0.4 ms
Lead Channel Sensing Intrinsic Amplitude: 1.125 mV
Lead Channel Sensing Intrinsic Amplitude: 1.125 mV
Lead Channel Sensing Intrinsic Amplitude: 17.25 mV
Lead Channel Sensing Intrinsic Amplitude: 17.25 mV
Lead Channel Setting Pacing Amplitude: 1.5 V
Lead Channel Setting Pacing Amplitude: 2.5 V
Lead Channel Setting Pacing Pulse Width: 0.4 ms
Lead Channel Setting Sensing Sensitivity: 5.6 mV

## 2020-11-14 IMAGING — DX DG CHEST 1V PORT
1 series · 1 of 1 positions shown · non-contrast
Comparison: None

CLINICAL DATA: Bradycardia

EXAM:
PORTABLE CHEST 1 VIEW

[chest]
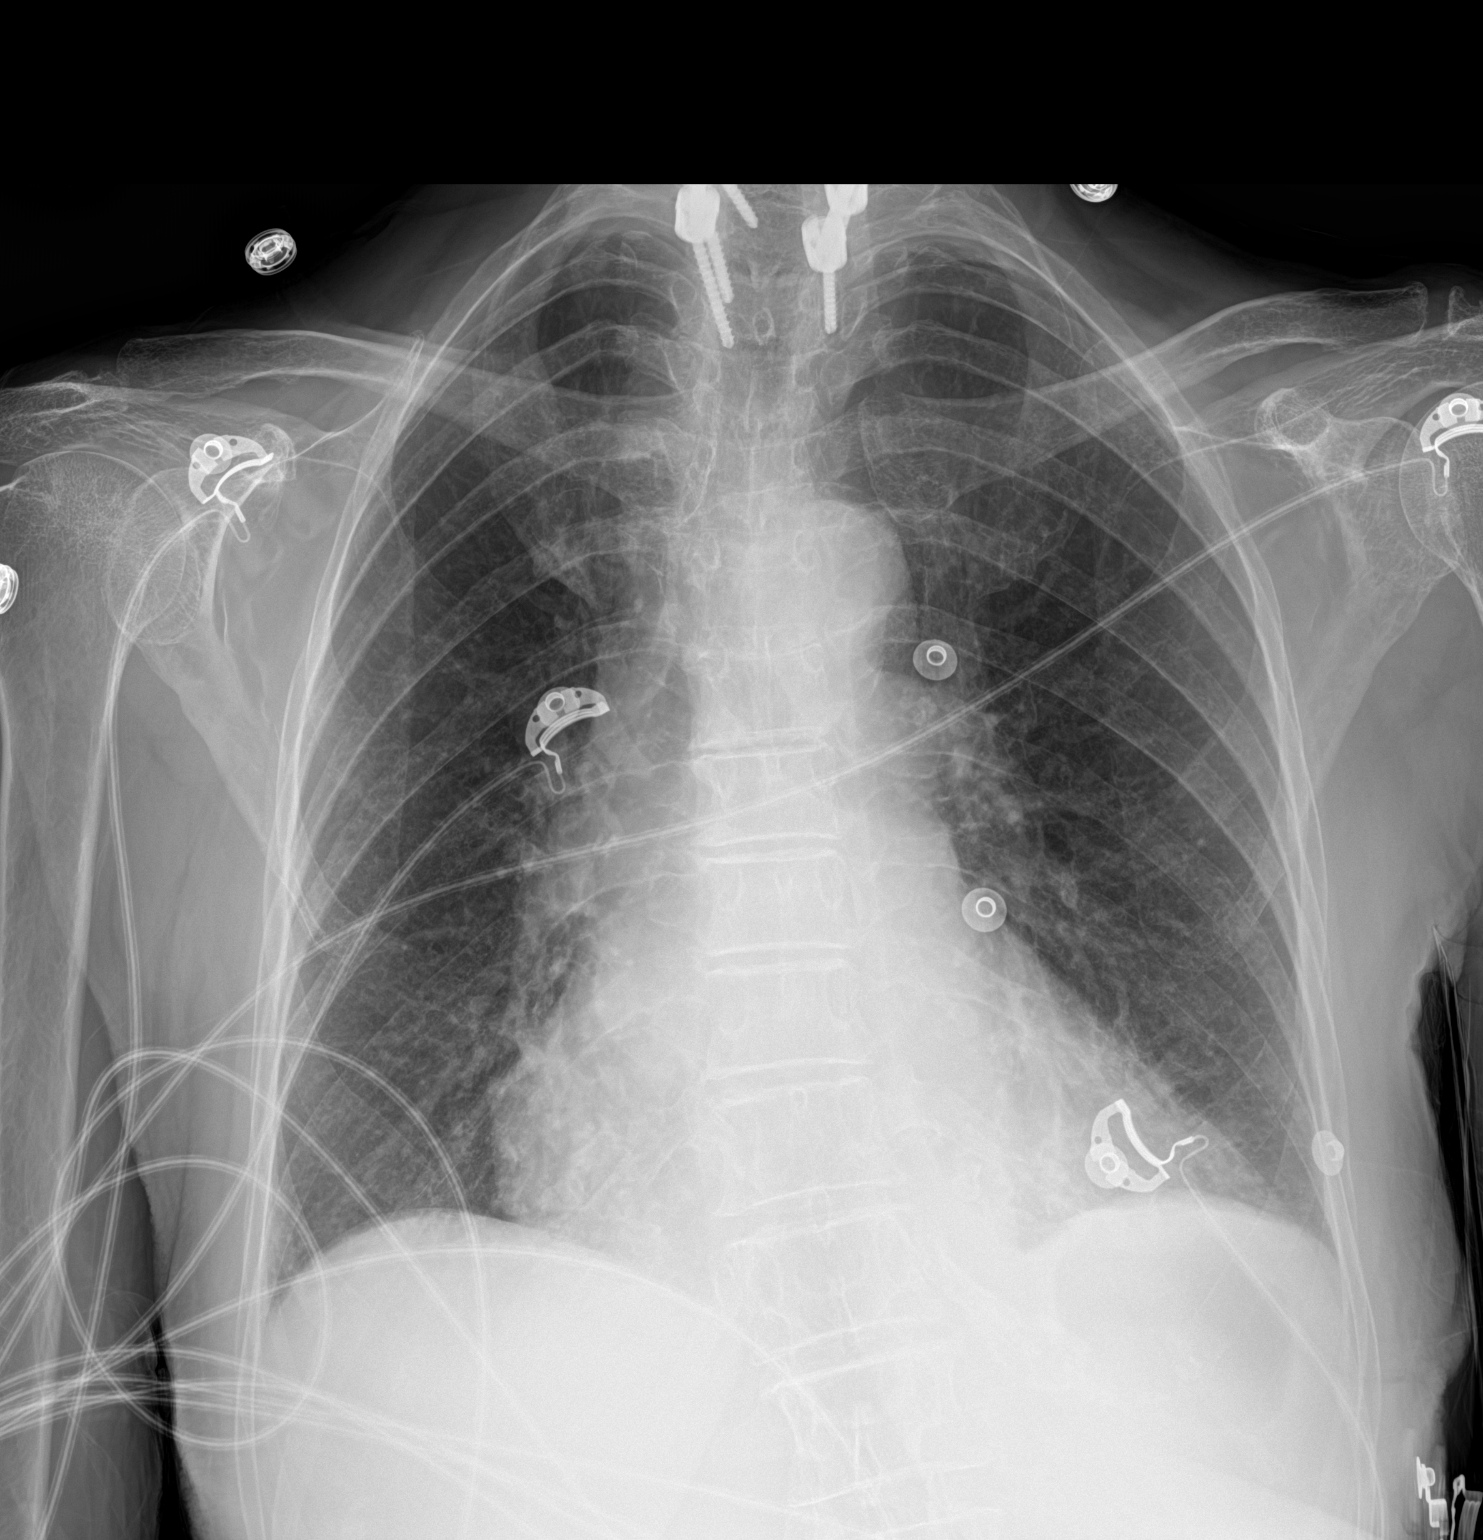

[1 of 1 positions shown; findings below may reference images not displayed]

FINDINGS: Lungs are clear. Heart size mildly enlarged. No signs of
consolidation or pleural effusion.

Osteopenia without acute bone finding. Signs of cervicothoracic
spinal fusion partially imaged.
IMPRESSION: No acute cardiopulmonary disease.

## 2020-11-15 IMAGING — CR DG CHEST 2V
2 series · 2 of 2 positions shown · non-contrast
Comparison: Chest x-ray 01/12/2019.

CLINICAL DATA: 84-year-old female with history of pacemaker
placement.

EXAM:
CHEST - 2 VIEW

[chest lat]
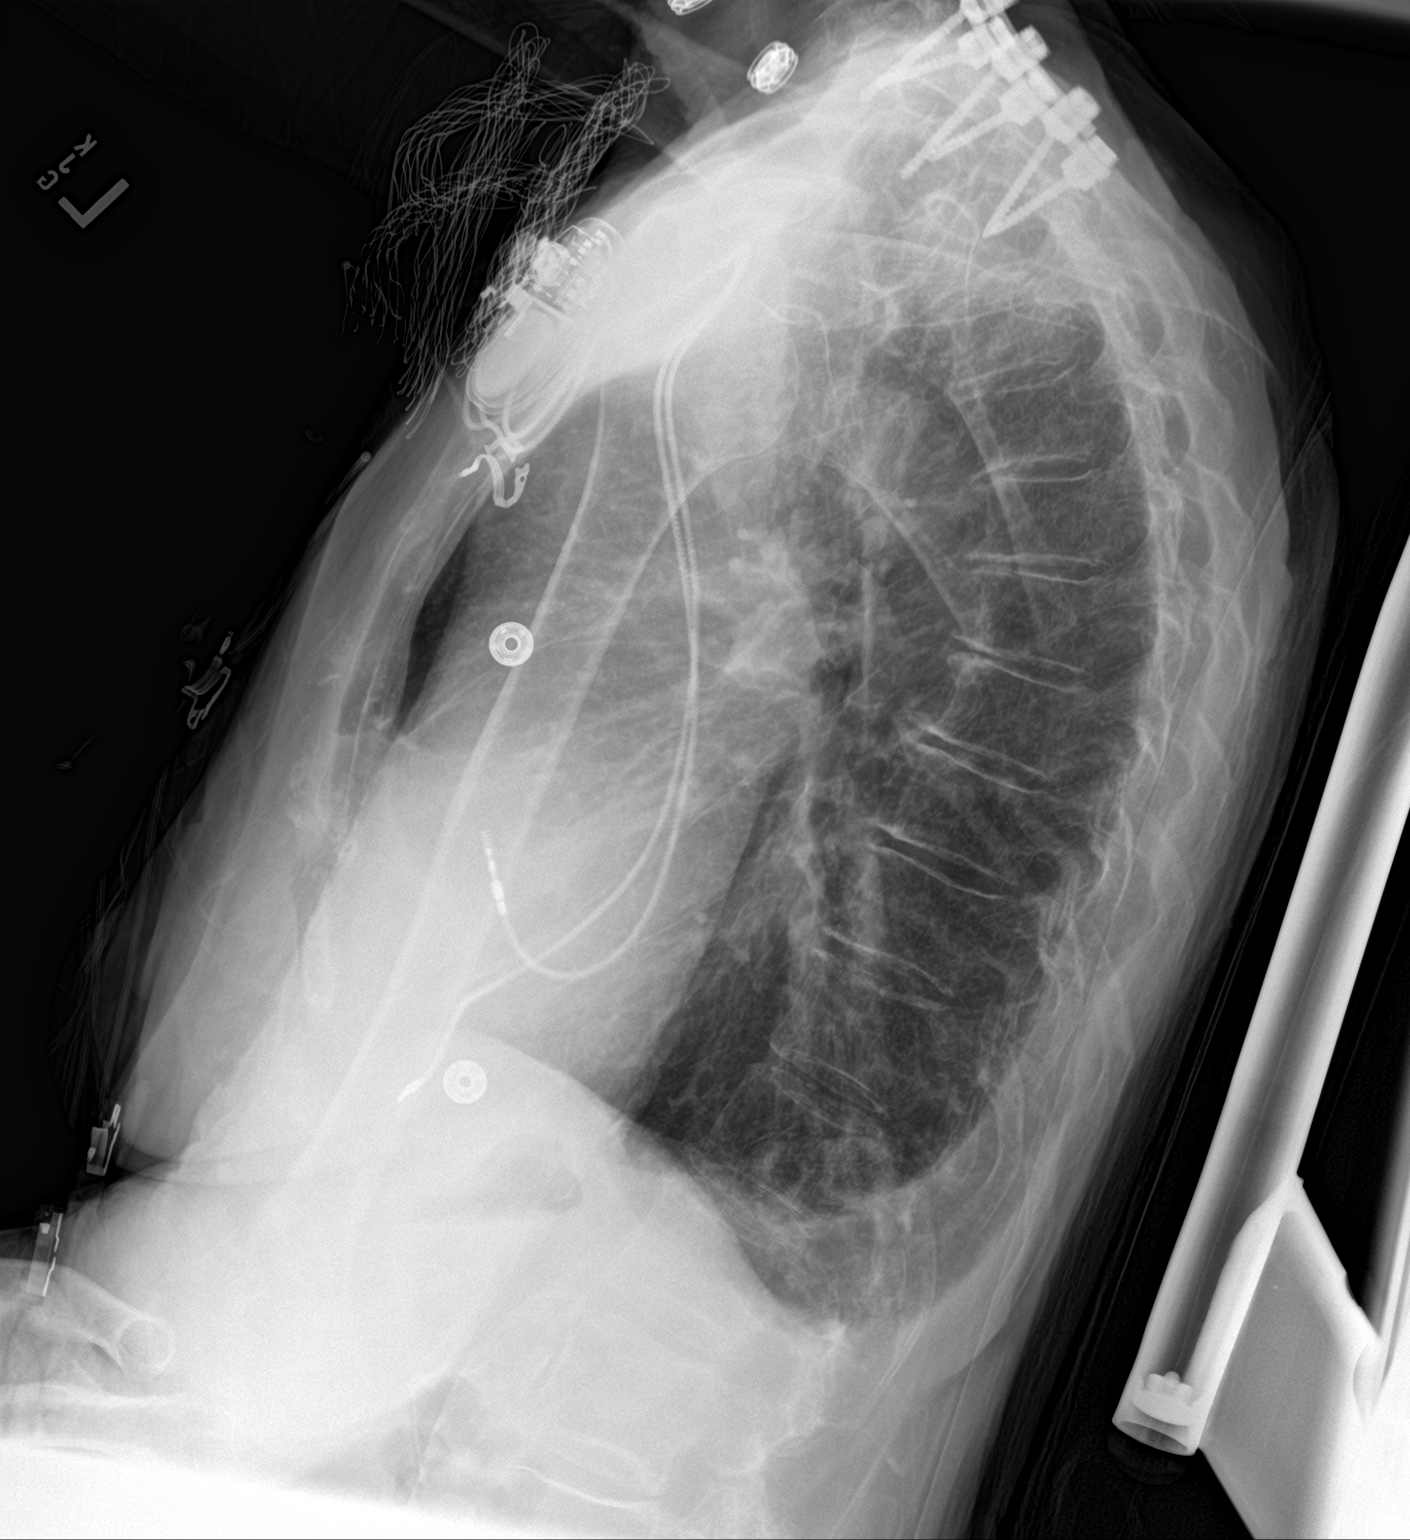

[chest ap]
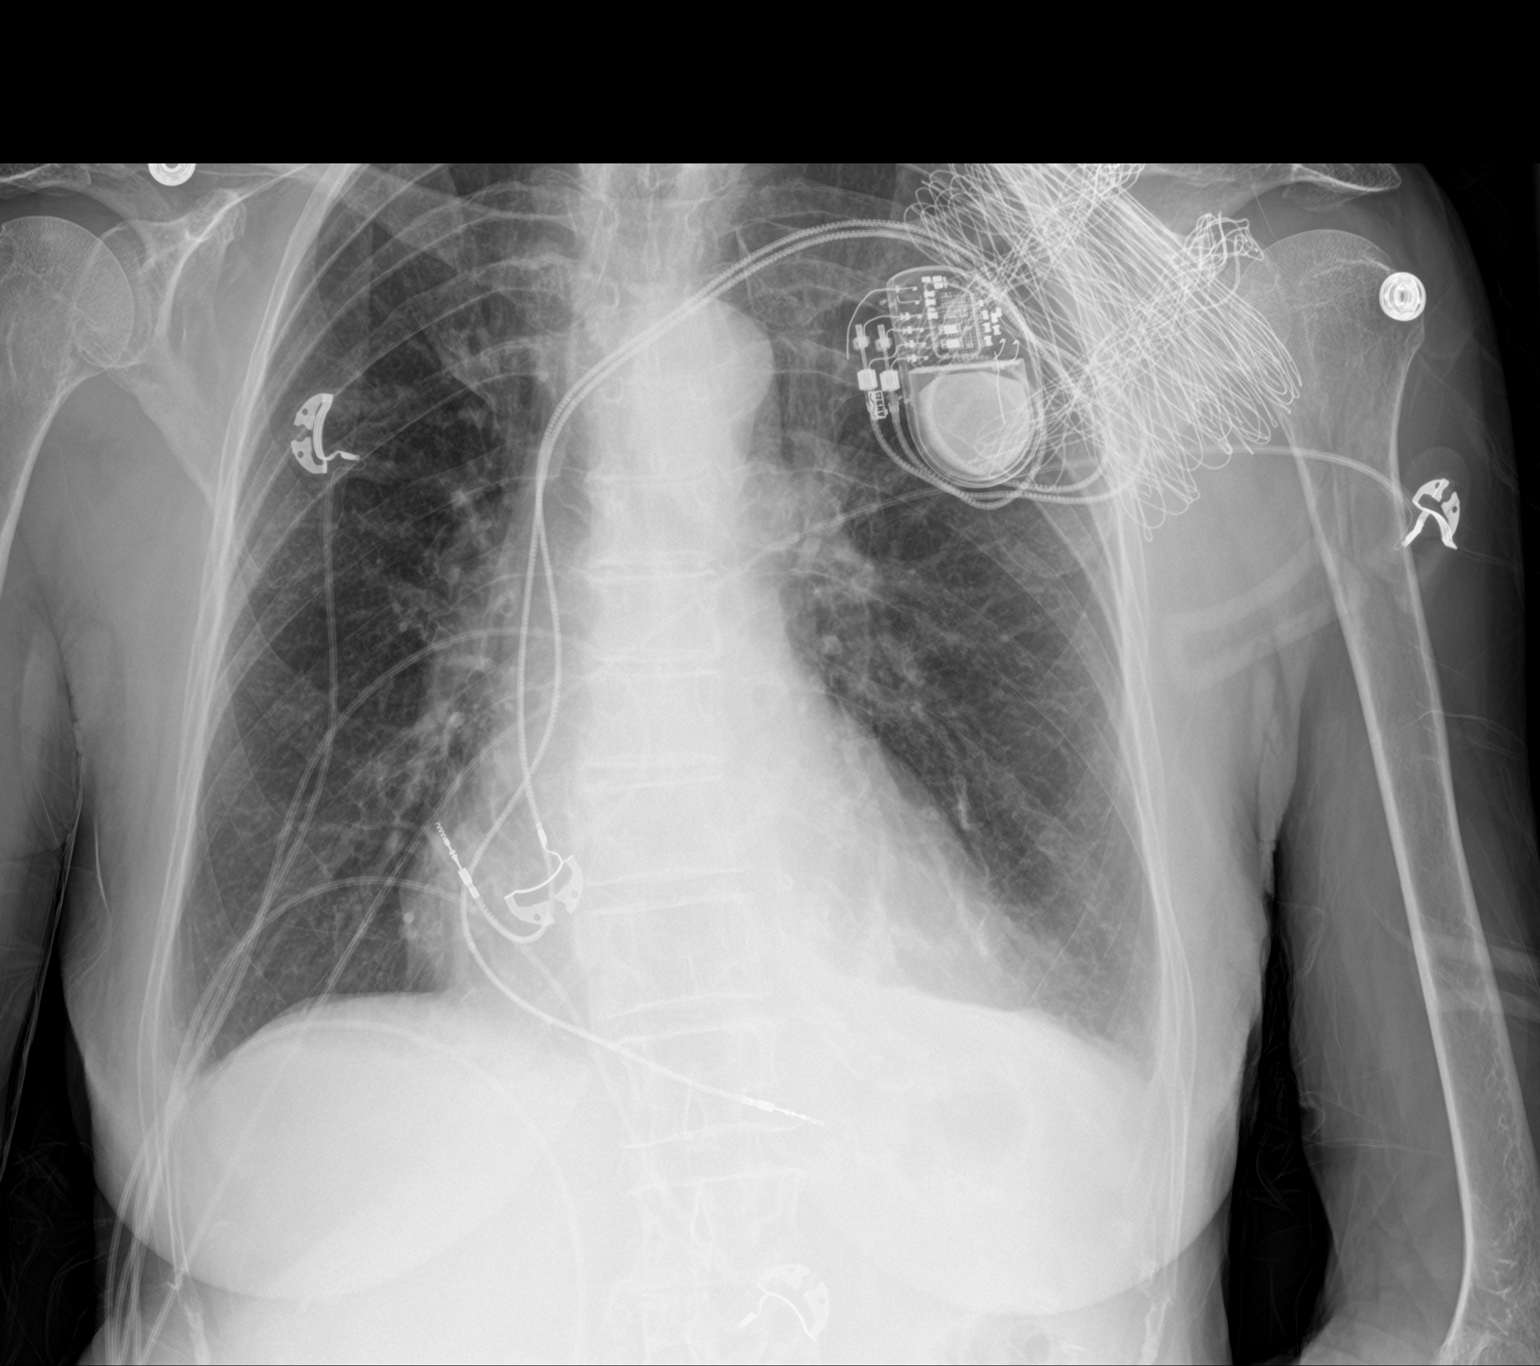

[2 of 2 positions shown; findings below may reference images not displayed]

FINDINGS: Lung volumes are normal. No pneumothorax. No acute consolidative
airspace disease. No pleural effusions. No evidence of pulmonary
edema. Mild cardiomegaly. New left-sided pacemaker device in place
with lead tips projecting over the expected location of the right
atrium and right ventricular apex. Radiopaque material projecting
over the left axillary and subclavicular region, presumably exterior
to the patient. Orthopedic fixation hardware in the lower cervical
spine incidentally noted.
IMPRESSION: 1. New left-sided pacemaker device appears appropriately located
without pneumothorax or other acute complicating features.
2. Mild cardiomegaly.

## 2020-11-22 ENCOUNTER — Other Ambulatory Visit: Payer: Self-pay | Admitting: Physician Assistant

## 2020-11-22 DIAGNOSIS — I10 Essential (primary) hypertension: Secondary | ICD-10-CM

## 2020-11-27 NOTE — Progress Notes (Signed)
Remote pacemaker transmission.   

## 2020-11-28 ENCOUNTER — Encounter: Payer: Self-pay | Admitting: Physician Assistant

## 2020-11-28 ENCOUNTER — Telehealth: Payer: Self-pay

## 2020-11-28 NOTE — Telephone Encounter (Signed)
Bethany Carpenter called from [facility] and stated that they are on back order for Ultram. She stated that she needs approval to fill an alternate prescription. She stated that the call back number with any questions is [phone], select Option 2. The reference number is  [id number]. Please Advise.

## 2020-11-28 NOTE — Telephone Encounter (Signed)
Mychart message sent to patient.

## 2020-11-28 NOTE — Telephone Encounter (Signed)
Any other medications that we can send in for patient?

## 2020-11-29 ENCOUNTER — Other Ambulatory Visit: Payer: Self-pay | Admitting: Physician Assistant

## 2020-11-29 ENCOUNTER — Encounter: Payer: Self-pay | Admitting: Physician Assistant

## 2020-11-29 DIAGNOSIS — M542 Cervicalgia: Secondary | ICD-10-CM

## 2020-11-29 MED ORDER — TRAMADOL HCL 50 MG PO TABS: ORAL_TABLET | ORAL | 0 refills | Status: DC

## 2020-12-16 DIAGNOSIS — H1013 Acute atopic conjunctivitis, bilateral: Secondary | ICD-10-CM | POA: Diagnosis not present

## 2020-12-28 ENCOUNTER — Ambulatory Visit (INDEPENDENT_AMBULATORY_CARE_PROVIDER_SITE_OTHER): Payer: Medicare HMO

## 2020-12-28 DIAGNOSIS — Z0000 Encounter for general adult medical examination without abnormal findings: Secondary | ICD-10-CM | POA: Diagnosis not present

## 2020-12-28 NOTE — Patient Instructions (Signed)
Health Maintenance, Female Adopting a healthy lifestyle and getting preventive care are important in promoting health and wellness. Ask your health care provider about: The right schedule for you to have regular tests and exams. Things you can do on your own to prevent diseases and keep yourself healthy. What should I know about diet, weight, and exercise? Eat a healthy diet  Eat a diet that includes plenty of vegetables, fruits, low-fat dairy products, and lean protein. Do not eat a lot of foods that are high in solid fats, added sugars, or sodium. Maintain a healthy weight Body mass index (BMI) is used to identify weight problems. It estimates body fat based on height and weight. Your health care provider can help determine your BMI and help you achieve or maintain a healthy weight. Get regular exercise Get regular exercise. This is one of the most important things you can do for your health. Most adults should: Exercise for at least 150 minutes each week. The exercise should increase your heart rate and make you sweat (moderate-intensity exercise). Do strengthening exercises at least twice a week. This is in addition to the moderate-intensity exercise. Spend less time sitting. Even light physical activity can be beneficial. Watch cholesterol and blood lipids Have your blood tested for lipids and cholesterol at 85 years of age, then have this test every 5 years. Have your cholesterol levels checked more often if: Your lipid or cholesterol levels are high. You are older than 85 years of age. You are at high risk for heart disease. What should I know about cancer screening? Depending on your health history and family history, you may need to have cancer screening at various ages. This may include screening for: Breast cancer. Cervical cancer. Colorectal cancer. Skin cancer. Lung cancer. What should I know about heart disease, diabetes, and high blood pressure? Blood pressure and heart  disease High blood pressure causes heart disease and increases the risk of stroke. This is more likely to develop in people who have high blood pressure readings, are of African descent, or are overweight. Have your blood pressure checked: Every 3-5 years if you are 18-39 years of age. Every year if you are 40 years old or older. Diabetes Have regular diabetes screenings. This checks your fasting blood sugar level. Have the screening done: Once every three years after age 40 if you are at a normal weight and have a low risk for diabetes. More often and at a younger age if you are overweight or have a high risk for diabetes. What should I know about preventing infection? Hepatitis B If you have a higher risk for hepatitis B, you should be screened for this virus. Talk with your health care provider to find out if you are at risk for hepatitis B infection. Hepatitis C Testing is recommended for: Everyone born from 1945 through 1965. Anyone with known risk factors for hepatitis C. Sexually transmitted infections (STIs) Get screened for STIs, including gonorrhea and chlamydia, if: You are sexually active and are younger than 85 years of age. You are older than 85 years of age and your health care provider tells you that you are at risk for this type of infection. Your sexual activity has changed since you were last screened, and you are at increased risk for chlamydia or gonorrhea. Ask your health care provider if you are at risk. Ask your health care provider about whether you are at high risk for HIV. Your health care provider may recommend a prescription medicine   to help prevent HIV infection. If you choose to take medicine to prevent HIV, you should first get tested for HIV. You should then be tested every 3 months for as long as you are taking the medicine. Pregnancy If you are about to stop having your period (premenopausal) and you may become pregnant, seek counseling before you get  pregnant. Take 400 to 800 micrograms (mcg) of folic acid every day if you become pregnant. Ask for birth control (contraception) if you want to prevent pregnancy. Osteoporosis and menopause Osteoporosis is a disease in which the bones lose minerals and strength with aging. This can result in bone fractures. If you are 65 years old or older, or if you are at risk for osteoporosis and fractures, ask your health care provider if you should: Be screened for bone loss. Take a calcium or vitamin D supplement to lower your risk of fractures. Be given hormone replacement therapy (HRT) to treat symptoms of menopause. Follow these instructions at home: Lifestyle Do not use any products that contain nicotine or tobacco, such as cigarettes, e-cigarettes, and chewing tobacco. If you need help quitting, ask your health care provider. Do not use street drugs. Do not share needles. Ask your health care provider for help if you need support or information about quitting drugs. Alcohol use Do not drink alcohol if: Your health care provider tells you not to drink. You are pregnant, may be pregnant, or are planning to become pregnant. If you drink alcohol: Limit how much you use to 0-1 drink a day. Limit intake if you are breastfeeding. Be aware of how much alcohol is in your drink. In the [place] one drink equals one 12 oz bottle of beer (355 mL), one 5 oz glass of wine (148 mL), or one 1 oz glass of hard liquor (44 mL). General instructions Schedule regular health, dental, and eye exams. Stay current with your vaccines. Tell your health care provider if: You often feel depressed. You have ever been abused or do not feel safe at home. Summary Adopting a healthy lifestyle and getting preventive care are important in promoting health and wellness. Follow your health care provider's instructions about healthy diet, exercising, and getting tested or screened for diseases. Follow your health care provider's  instructions on monitoring your cholesterol and blood pressure. This information is not intended to replace advice given to you by your health care provider. Make sure you discuss any questions you have with your health care provider. Document Revised: 05/31/2020 Document Reviewed: 03/16/2018 Elsevier Patient Education  2022 [employer]  

## 2020-12-28 NOTE — Progress Notes (Signed)
Subjective:   Bethany Carpenter is a 87 y.o. female who presents for an Initial Medicare Annual Wellness Visit.  I connected with  Bethany Carpenter on 12/28/20 by a audio enabled telemedicine application and verified that I am speaking with the correct person using two identifiers.   Location of patient: Home Location of provider: Office  Persons participating in visit Bethany Carpenter (patient) Loralyn Freshwater RMA   I discussed the limitations of evaluation and management by telemedicine. The patient expressed understanding and agreed to proceed.   Review of Systems    Defer to PCP       Objective:    Today's Vitals   12/28/20 1009  PainSc: 3    There is no height or weight on file to calculate BMI.  Advanced Directives 12/28/2020 03/08/2019 01/13/2019 01/12/2019  Does Patient Have a Medical Advance Directive? No No - No  Would patient like information on creating a medical advance directive? Yes (ED - Information included in AVS) No - Patient declined No - Patient declined Yes (ED - send information to MyChart)    Current Medications (verified) Outpatient Encounter Medications as of 12/28/2020  Medication Sig   aspirin 81 MG EC tablet Take 81 mg by mouth daily.    Calcium-Magnesium-Vitamin D (CITRACAL CALCIUM+D) 600-40-500 MG-MG-UNIT TB24 Take 2 tablets by mouth daily.   carvedilol (COREG) 12.5 MG tablet Take 1 tablet (12.5 mg total) by mouth 2 (two) times daily with a meal.   fluticasone (FLONASE) 50 MCG/ACT nasal spray Place 1 spray into both nostrils daily.   lisinopril (ZESTRIL) 10 MG tablet TAKE 1 TABLET TWICE A DAY   prednisoLONE acetate (PRED FORTE) 1 % ophthalmic suspension 1 drop 2 (two) times daily.   simvastatin (ZOCOR) 40 MG tablet Take 1 tablet (40 mg total) by mouth daily.   traMADol (ULTRAM) 50 MG tablet TAKE 1 TABLET EVERY 6 HOURSAS NEEDED FOR MODERATE PAIN   triamcinolone cream (KENALOG) 0.1 % Apply 1 application topically 2 (two) times daily. For 7-10 days maximum    No facility-administered encounter medications on file as of 12/28/2020.    Allergies (verified) Other, Nickel, Penicillins, and Sulfa antibiotics   History: Past Medical History:  Diagnosis Date   Allergy    Arthritis    Cancer ([place])    Skin   Colon polyps    Heart disease    History of chickenpox    Hyperlipidemia    Hypertension    Presence of permanent cardiac pacemaker 01/12/2019   Past Surgical History:  Procedure Laterality Date   ANGIOPLASTY  03/08/2007   BREAST BIOPSY  09/28/2003   CATARACT EXTRACTION  11/25/2005   04/07/2006   CERVICAL SPINE SURGERY  11/24/2012   02/02/2008   INSERT / REPLACE / REMOVE PACEMAKER  01/12/2019   PACEMAKER IMPLANT N/A 01/12/2019   Procedure: PACEMAKER IMPLANT;  Surgeon: Thompson Grayer, MD;  Location: [facility];  Service: Cardiovascular;  Laterality: N/A;   TONSILLECTOMY  1952   TUBAL LIGATION  1970   Family History  Problem Relation Age of Onset   Hearing loss Mother    Hypertension Mother    Osteoporosis Mother    Arthritis Mother    Cancer Father        Skin   Hearing loss Father    Arthritis Father    Diabetes Brother    Stroke Brother    Early death Brother    Depression Daughter    Early death Son    Early death  Maternal Grandfather    Early death Paternal Grandfather    Social History   Socioeconomic History   Marital status: Divorced    Spouse name: Not on file   Number of children: Not on file   Years of education: Not on file   Highest education level: Not on file  Occupational History   Occupation: Retired  Tobacco Use   Smoking status: Never   Smokeless tobacco: Never  Vaping Use   Vaping Use: Never used  Substance and Sexual Activity   Alcohol use: No   Drug use: No   Sexual activity: Not Currently  Other Topics Concern   Not on file  Social History Narrative   Not on file   Social Determinants of Health   Financial Resource Strain: Not on file  Food Insecurity: No Food  Insecurity   Worried About Running Out of Food in the Last Year: Never true   [place] in the Last Year: Never true  Transportation Needs: No Transportation Needs   Lack of Transportation (Medical): No   Lack of Transportation (Non-Medical): No  Physical Activity: Insufficiently Active   Days of Exercise per Week: 3 days   Minutes of Exercise per Session: 10 min  Stress: Not on file  Social Connections: Unknown   Frequency of Communication with Friends and Family: Twice a week   Frequency of Social Gatherings with Friends and Family: Three times a week   Attends Religious Services: Never   Active Member of Clubs or Organizations: No   Attends Music therapist: Never   Marital Status: Not on file    Tobacco Counseling Counseling given: Not Answered   Clinical Intake:  Pre-visit preparation completed: Yes  Pain : 0-10 Pain Score: 3  Pain Type: Neuropathic pain Pain Location: Leg Pain Orientation: Right, Left Pain Onset: Yesterday Pain Frequency: Occasional     Nutritional Status: BMI of 19-24  Normal Nutritional Risks: None Diabetes: No  How often do you need to have someone help you when you read instructions, pamphlets, or other written materials from your doctor or pharmacy?: 1 - Never  Diabetic?No         Activities of Daily Living In your present state of health, do you have any difficulty performing the following activities: 04/29/2020  Hearing? N  Vision? N  Difficulty concentrating or making decisions? N  Walking or climbing stairs? N  Dressing or bathing? N  Doing errands, shopping? N  Some recent data might be hidden    Patient Care Team: [place], Randa Evens, PA-C as PCP - General (Physician Assistant) Madelin Rear, [facility] as Pharmacist (Pharmacist)  Indicate any recent Medical Services you may have received from other than [facility] providers in the past year (date may be approximate).     Assessment:   This is a routine wellness  examination for [facility].  Hearing/Vision screen No results found.  Dietary issues and exercise activities discussed:     Goals Addressed   None   Depression Screen PHQ 2/9 Scores 12/28/2020 08/29/2020 11/07/2019 08/16/2018 05/02/2018 01/03/2018 05/14/2017  PHQ - 2 Score 0 0 0 0 0 0 0  PHQ- 9 Score - 0 - 0 - 0 -    Fall Risk Fall Risk  12/28/2020 08/29/2020 11/07/2019 05/02/2018 05/14/2017  Falls in the past year? 0 1 0 0 No  Number falls in past yr: 0 0 0 0 -  Injury with Fall? 0 0 0 0 -  Risk for fall due  to : No Fall Risks - - - -  Follow up - - Falls evaluation completed Falls evaluation completed -    FALL RISK PREVENTION PERTAINING TO THE HOME:  Any stairs in or around the home? No  If so, are there any without handrails? No  Home free of loose throw rugs in walkways, pet beds, electrical cords, etc? Yes  Adequate lighting in your home to reduce risk of falls? Yes   ASSISTIVE DEVICES UTILIZED TO PREVENT FALLS:  Life alert? No  Use of a cane, walker or w/c? No  Grab bars in the bathroom? No  Shower chair or bench in shower? Yes  Elevated toilet seat or a handicapped toilet? No   TIMED UP AND GO:  Was the test performed? No .  Length of time to ambulate 10 feet: N/A sec.     Cognitive Function:        Immunizations Immunization History  Administered Date(s) Administered   Fluad Quad(high Dose 65+) 12/06/2018   Influenza, High Dose Seasonal PF 01/03/2018   Influenza,inj,Quad PF,6+ Mos 01/06/2016, 01/04/2017   Influenza,inj,quad, With Preservative 01/06/2016   Influenza-Unspecified 01/05/2017, 01/02/2020   Moderna SARS-COV2 Booster Vaccination 07/16/2020, 12/27/2020   Moderna Sars-Covid-2 Vaccination 05/17/2019, 06/07/2019, 02/15/2020   Pneumococcal Conjugate-13 05/15/2015   Pneumococcal Polysaccharide-23 04/06/2001   Tdap 04/06/2012, 04/06/2012    TDAP status: Up to date  Flu Vaccine status: Due, Education has been provided regarding the importance of this vaccine.  Advised may receive this vaccine at local pharmacy or Health Dept. Aware to provide a copy of the vaccination record if obtained from local pharmacy or Health Dept. Verbalized acceptance and understanding.  Pneumococcal vaccine status: Up to date  Covid-19 vaccine status: Completed vaccines  Qualifies for Shingles Vaccine? Yes   Zostavax completed No   Shingrix Completed?: No.    Education has been provided regarding the importance of this vaccine. Patient has been advised to call insurance company to determine out of pocket expense if they have not yet received this vaccine. Advised may also receive vaccine at local pharmacy or Health Dept. Verbalized acceptance and understanding.  Screening Tests Health Maintenance  Topic Date Due   Zoster Vaccines- Shingrix (1 of 2) Never done   INFLUENZA VACCINE  11/04/2020   TETANUS/TDAP  04/06/2022   DEXA SCAN  Completed   COVID-19 Vaccine  Completed   HPV VACCINES  Aged Out    Health Maintenance  Health Maintenance Due  Topic Date Due   Zoster Vaccines- Shingrix (1 of 2) Never done   INFLUENZA VACCINE  11/04/2020    Colorectal cancer screening: No longer required.   Mammogram status: No longer required due to Age.  Bone Density status: Completed 02/13/20. Results reflect: Bone density results: OSTEOPOROSIS. Repeat every 2 years.  Lung Cancer Screening: (Low Dose CT Chest recommended if Age 7-80 years, 30 pack-year currently smoking OR have quit w/in 15years.) does not qualify.   Lung Cancer Screening Referral: N/A  Additional Screening:  Hepatitis C Screening: does not qualify; Completed N/A  Vision Screening: Recommended annual ophthalmology exams for early detection of glaucoma and other disorders of the eye. Is the patient up to date with their annual eye exam?  Yes  Who is the provider or what is the name of the office in which the patient attends annual eye exams? [facility] If pt is not established with a provider, would  they like to be referred to a provider to establish care? No .   Dental  Screening: Recommended annual dental exams for proper oral hygiene  Community Resource Referral / Chronic Care Management: CRR required this visit?  No   CCM required this visit?  No      Plan:     I have personally reviewed and noted the following in the patient's chart:   Medical and social history Use of alcohol, tobacco or illicit drugs  Current medications and supplements including opioid prescriptions. Patient is not currently taking opioid prescriptions. Functional ability and status Nutritional status Physical activity Advanced directives List of other physicians Hospitalizations, surgeries, and ER visits in previous 12 months Vitals Screenings to include cognitive, depression, and falls Referrals and appointments  In addition, I have reviewed and discussed with patient certain preventive protocols, quality metrics, and best practice recommendations. A written personalized care plan for preventive services as well as general preventive health recommendations were provided to patient.     Loralyn Freshwater, [facility]   12/28/2020   Nurse Notes: Non Face to face 50 minutes   Ms. Horsey , Thank you for taking time to come for your Medicare Wellness Visit. I appreciate your ongoing commitment to your health goals. Please review the following plan we discussed and let me know if I can assist you in the future.   These are the goals we discussed:  Goals      PharmD Care Plan     CARE PLAN ENTRY  Current Barriers:  Chronic Disease Management support, education, and care coordination needs related to  high cholesterol, high blood pressure, chronic pain.  Pharmacist Clinical Goal(s):  Over next 6 months: Maintain LDL cholesterol <100, blood pressure <140/90, notify us of any worsening pain.  Interventions: Comprehensive medication review performed.  Patient Self Care Activities:  Patient  verbalizes understanding of plan to check blood pressure once weekly and notify us if readings consistently >140/90. Please call with any questions!   Initial goal documentation        This is a list of the screening recommended for you and due dates:  Health Maintenance  Topic Date Due   Zoster (Shingles) Vaccine (1 of 2) Never done   Flu Shot  11/04/2020   Tetanus Vaccine  04/06/2022   DEXA scan (bone density measurement)  Completed   COVID-19 Vaccine  Completed   HPV Vaccine  Aged Out

## 2021-01-14 ENCOUNTER — Other Ambulatory Visit: Payer: Self-pay

## 2021-01-14 ENCOUNTER — Ambulatory Visit (INDEPENDENT_AMBULATORY_CARE_PROVIDER_SITE_OTHER): Payer: Medicare HMO | Admitting: Physician Assistant

## 2021-01-14 ENCOUNTER — Encounter: Payer: Self-pay | Admitting: Physician Assistant

## 2021-01-14 VITALS — BP 146/66 | HR 64 | Temp 98.0°F | Ht 61.0 in | Wt 110.0 lb

## 2021-01-14 DIAGNOSIS — Z131 Encounter for screening for diabetes mellitus: Secondary | ICD-10-CM | POA: Diagnosis not present

## 2021-01-14 DIAGNOSIS — G8929 Other chronic pain: Secondary | ICD-10-CM | POA: Diagnosis not present

## 2021-01-14 DIAGNOSIS — I10 Essential (primary) hypertension: Secondary | ICD-10-CM | POA: Diagnosis not present

## 2021-01-14 DIAGNOSIS — M542 Cervicalgia: Secondary | ICD-10-CM

## 2021-01-14 DIAGNOSIS — E785 Hyperlipidemia, unspecified: Secondary | ICD-10-CM | POA: Diagnosis not present

## 2021-01-14 LAB — CBC WITH DIFFERENTIAL/PLATELET
Basophils Absolute: 0 10*3/uL (ref 0.0–0.1)
Basophils Relative: 0.9 % (ref 0.0–3.0)
Eosinophils Absolute: 0.2 10*3/uL (ref 0.0–0.7)
Eosinophils Relative: 4.1 % (ref 0.0–5.0)
HCT: 36.8 % (ref 36.0–46.0)
Hemoglobin: 12.1 g/dL (ref 12.0–15.0)
Lymphocytes Relative: 30 % (ref 12.0–46.0)
Lymphs Abs: 1.3 10*3/uL (ref 0.7–4.0)
MCHC: 32.8 g/dL (ref 30.0–36.0)
MCV: 87.2 fl (ref 78.0–100.0)
Monocytes Absolute: 0.5 10*3/uL (ref 0.1–1.0)
Monocytes Relative: 12.3 % — ABNORMAL HIGH (ref 3.0–12.0)
Neutro Abs: 2.2 10*3/uL (ref 1.4–7.7)
Neutrophils Relative %: 52.7 % (ref 43.0–77.0)
Platelets: 196 10*3/uL (ref 150.0–400.0)
RBC: 4.22 Mil/uL (ref 3.87–5.11)
RDW: 14.5 % (ref 11.5–15.5)
WBC: 4.2 10*3/uL (ref 4.0–10.5)

## 2021-01-14 LAB — LIPID PANEL
Cholesterol: 134 mg/dL (ref 0–200)
HDL: 64 mg/dL (ref >39.00)
LDL Cholesterol: 55 mg/dL (ref 0–99)
NonHDL: 70.21
Total CHOL/HDL Ratio: 2
Triglycerides: 76 mg/dL (ref 0.0–149.0)
VLDL: 15.2 mg/dL (ref 0.0–40.0)

## 2021-01-14 LAB — COMPREHENSIVE METABOLIC PANEL
ALT: 21 U/L (ref 0–35)
AST: 32 U/L (ref 0–37)
Albumin: 4.1 g/dL (ref 3.5–5.2)
Alkaline Phosphatase: 52 U/L (ref 39–117)
BUN: 18 mg/dL (ref 6–23)
CO2: 33 mEq/L — ABNORMAL HIGH (ref 19–32)
Calcium: 9.8 mg/dL (ref 8.4–10.5)
Chloride: 103 mEq/L (ref 96–112)
Creatinine, Ser: 0.85 mg/dL (ref 0.40–1.20)
GFR: 62.72 mL/min (ref >60.00)
Glucose, Bld: 79 mg/dL (ref 70–99)
Potassium: 4.6 mEq/L (ref 3.5–5.1)
Sodium: 139 mEq/L (ref 135–145)
Total Bilirubin: 0.4 mg/dL (ref 0.2–1.2)
Total Protein: 7 g/dL (ref 6.0–8.3)

## 2021-01-14 NOTE — Progress Notes (Signed)
Subjective:    Patient ID: Bethany Carpenter, female    DOB: [birth year], 87 y.o.   MRN: [id number]  Chief Complaint  Patient presents with   Follow-up    HPI Patient is in today for regular f/up. No new concerns. See A/P.   Recently had COVID-19 booster and flu vaccine from her pharmacy in the last two weeks.   Past Medical History:  Diagnosis Date   Allergy    Arthritis    Cancer ([place])    Skin   Colon polyps    Heart disease    History of chickenpox    Hyperlipidemia    Hypertension    Presence of permanent cardiac pacemaker 01/12/2019    Past Surgical History:  Procedure Laterality Date   ANGIOPLASTY  03/08/2007   BREAST BIOPSY  09/28/2003   CATARACT EXTRACTION  11/25/2005   04/07/2006   CERVICAL SPINE SURGERY  11/24/2012   02/02/2008   INSERT / REPLACE / REMOVE PACEMAKER  01/12/2019   PACEMAKER IMPLANT N/A 01/12/2019   Procedure: PACEMAKER IMPLANT;  Surgeon: Thompson Grayer, MD;  Location: [facility];  Service: Cardiovascular;  Laterality: N/A;   TONSILLECTOMY  1952   TUBAL LIGATION  1970    Family History  Problem Relation Age of Onset   Hearing loss Mother    Hypertension Mother    Osteoporosis Mother    Arthritis Mother    Cancer Father        Skin   Hearing loss Father    Arthritis Father    Diabetes Brother    Stroke Brother    Early death Brother    Depression Daughter    Early death Son    Early death Maternal Grandfather    Early death Paternal Grandfather     Social History   Tobacco Use   Smoking status: Never   Smokeless tobacco: Never  Vaping Use   Vaping Use: Never used  Substance Use Topics   Alcohol use: No   Drug use: No     Allergies  Allergen Reactions   Other Hives and Itching    Fragrances   Nickel    Penicillins Rash    Did it involve swelling of the face/tongue/throat, SOB, or low BP? No Did it involve sudden or severe rash/hives, skin peeling, or any reaction on the inside of your mouth or nose? Yes Did you  need to seek medical attention at a hospital or doctor's office? Yes When did it last happen?      years ago  If all above answers are "NO", may proceed with cephalosporin use.    Sulfa Antibiotics Rash    Review of Systems  Constitutional:  Negative for activity change, appetite change and unexpected weight change.  Eyes:  Negative for discharge.  Respiratory:  Negative for shortness of breath.   Cardiovascular:  Negative for chest pain, palpitations and leg swelling.  Musculoskeletal:  Positive for arthralgias, back pain and neck pain.  Neurological:  Negative for dizziness and headaches.  Psychiatric/Behavioral:  Negative for confusion.   REFER TO HPI FOR PERTINENT POSITIVES AND NEGATIVES      Objective:     BP (!) 146/66   Pulse 64   Temp 98 F (36.7 C)   Ht 5\' 1"  (1.549 m)   Wt 110 lb (49.9 kg)   SpO2 99%   BMI 20.78 kg/m   Wt Readings from Last 3 Encounters:  01/14/21 110 lb (49.9 kg)  10/14/20 110 lb 6.1  oz (50.1 kg)  08/29/20 110 lb 6.4 oz (50.1 kg)    BP Readings from Last 3 Encounters:  01/14/21 (!) 146/66  10/14/20 (!) 155/77  08/29/20 (!) 120/58     Physical Exam Vitals and nursing note reviewed.  Constitutional:      General: She is not in acute distress.    Appearance: Normal appearance. She is not ill-appearing.  HENT:     Head: Normocephalic.  Neck:     Vascular: No carotid bruit.  Cardiovascular:     Rate and Rhythm: Normal rate and regular rhythm.  Pulmonary:     Effort: Pulmonary effort is normal.     Breath sounds: Normal breath sounds.  Musculoskeletal:     Left shoulder: Decreased range of motion (external rotation limited).     Cervical back: Rigidity (chronic pain and spasm) present. No tenderness.  Lymphadenopathy:     Cervical: No cervical adenopathy.  Skin:    General: Skin is warm.  Neurological:     Mental Status: She is alert.  Psychiatric:        Mood and Affect: Mood normal.        Behavior: Behavior normal.        Assessment & Plan:   Problem List Items Addressed This Visit       Cardiovascular and Mediastinum   Essential hypertension   Relevant Orders   CBC with Differential/Platelet     Other   Hyperlipidemia   Relevant Orders   CBC with Differential/Platelet   Lipid panel   Chronic neck pain   Other Visit Diagnoses     Cervicalgia    -  Primary   Diabetes mellitus screening       Relevant Orders   CBC with Differential/Platelet   Comprehensive metabolic panel        1. Cervicalgia 2. Chronic neck pain -PDMP reviewed, no red flags.  Filling appropriately. - Doing well with tramadol 50 mg up to 4 times daily.  Not due for refill yet.  States she had trouble with mail order pharmacy last time and she is going to fill at her in person pharmacy for this next refill.  She will let me know where to send medication.  3. Essential hypertension -Blood pressure is stable - Continue to monitor - Taking lisinopril 10 mg once daily and Coreg 12.5 mg twice daily  4. Hyperlipidemia, unspecified hyperlipidemia type - She has been taking Zocor 40 mg - Check lipid panel today  5. Diabetes mellitus screening -CBC, CMP, lipid panel in office today and will call with results   This note was prepared with assistance of Dragon voice recognition software. Occasional wrong-word or sound-a-like substitutions may have occurred due to the inherent limitations of voice recognition software.  Time Spent: 31 minutes of total time was spent on the date of the encounter performing the following actions: chart review prior to seeing the patient, obtaining history, performing a medically necessary exam, counseling on the treatment plan, placing orders, and documenting in our EHR.    Coryn Mosso M Cay Kath, PA-C

## 2021-01-14 NOTE — Patient Instructions (Signed)
Good to see you again. Please go to the lab for blood work and I will send results through [place]. Call for refills as they come due. Monitor blood pressure at home. Stay active. Drink plenty of water.  Wishing you happy holidays! Call if any concerns before next visit.

## 2021-01-23 ENCOUNTER — Telehealth: Payer: Self-pay

## 2021-01-23 NOTE — Telephone Encounter (Signed)
LAST APPOINTMENT DATE:  01/14/21  NEXT APPOINTMENT DATE: 04/17/21  MEDICATION:traMADol (ULTRAM) 50 MG tablet  PHARMACY: [place], [place] - [address] AT [address]   **Pt stated that pharmacy is needing PA for prescription. Please Advise.

## 2021-01-24 ENCOUNTER — Other Ambulatory Visit: Payer: Self-pay | Admitting: Physician Assistant

## 2021-01-24 DIAGNOSIS — M542 Cervicalgia: Secondary | ICD-10-CM

## 2021-01-24 MED ORDER — TRAMADOL HCL 50 MG PO TABS: ORAL_TABLET | ORAL | 0 refills | Status: DC

## 2021-01-24 NOTE — Telephone Encounter (Signed)
Pt called to check the status of her prescription. Please Advise.

## 2021-02-03 ENCOUNTER — Ambulatory Visit (INDEPENDENT_AMBULATORY_CARE_PROVIDER_SITE_OTHER): Payer: Medicare HMO

## 2021-02-03 DIAGNOSIS — I442 Atrioventricular block, complete: Secondary | ICD-10-CM

## 2021-02-03 LAB — CUP PACEART REMOTE DEVICE CHECK
Battery Remaining Longevity: 112 mo
Battery Voltage: 3.01 V
Brady Statistic AP VP Percent: 10.46 %
Brady Statistic AP VS Percent: 0 %
Brady Statistic AS VP Percent: 89.49 %
Brady Statistic AS VS Percent: 0.05 %
Brady Statistic RA Percent Paced: 10.46 %
Brady Statistic RV Percent Paced: 99.95 %
Date Time Interrogation Session: 20221031051839
Implantable Lead Implant Date: 20201008
Implantable Lead Implant Date: 20201008
Implantable Lead Location: 753859
Implantable Lead Location: 753860
Implantable Lead Model: 5076
Implantable Lead Model: 5076
Implantable Pulse Generator Implant Date: 20201008
Lead Channel Impedance Value: 266 Ohm
Lead Channel Impedance Value: 323 Ohm
Lead Channel Impedance Value: 342 Ohm
Lead Channel Impedance Value: 437 Ohm
Lead Channel Pacing Threshold Amplitude: 0.5 V
Lead Channel Pacing Threshold Amplitude: 0.875 V
Lead Channel Pacing Threshold Pulse Width: 0.4 ms
Lead Channel Pacing Threshold Pulse Width: 0.4 ms
Lead Channel Sensing Intrinsic Amplitude: 1.125 mV
Lead Channel Sensing Intrinsic Amplitude: 1.125 mV
Lead Channel Sensing Intrinsic Amplitude: 17.25 mV
Lead Channel Sensing Intrinsic Amplitude: 17.25 mV
Lead Channel Setting Pacing Amplitude: 1.5 V
Lead Channel Setting Pacing Amplitude: 2.5 V
Lead Channel Setting Pacing Pulse Width: 0.4 ms
Lead Channel Setting Sensing Sensitivity: 5.6 mV

## 2021-02-06 DIAGNOSIS — H00025 Hordeolum internum left lower eyelid: Secondary | ICD-10-CM | POA: Diagnosis not present

## 2021-02-11 ENCOUNTER — Other Ambulatory Visit: Payer: Self-pay | Admitting: Family Medicine

## 2021-02-11 DIAGNOSIS — I10 Essential (primary) hypertension: Secondary | ICD-10-CM

## 2021-02-11 NOTE — Progress Notes (Signed)
Remote pacemaker transmission.   

## 2021-02-24 ENCOUNTER — Other Ambulatory Visit: Payer: Self-pay | Admitting: Physician Assistant

## 2021-02-24 DIAGNOSIS — M542 Cervicalgia: Secondary | ICD-10-CM

## 2021-03-25 ENCOUNTER — Other Ambulatory Visit: Payer: Self-pay | Admitting: Physician Assistant

## 2021-03-25 DIAGNOSIS — M542 Cervicalgia: Secondary | ICD-10-CM

## 2021-03-29 ENCOUNTER — Other Ambulatory Visit: Payer: Self-pay | Admitting: Physician Assistant

## 2021-03-29 DIAGNOSIS — E785 Hyperlipidemia, unspecified: Secondary | ICD-10-CM

## 2021-04-01 ENCOUNTER — Encounter: Payer: Self-pay | Admitting: Physician Assistant

## 2021-04-08 ENCOUNTER — Encounter: Payer: Self-pay | Admitting: Physician Assistant

## 2021-04-09 NOTE — Telephone Encounter (Signed)
Left voice message for patient to call clinic.  

## 2021-04-17 ENCOUNTER — Ambulatory Visit (INDEPENDENT_AMBULATORY_CARE_PROVIDER_SITE_OTHER): Payer: Medicare HMO | Admitting: Physician Assistant

## 2021-04-17 ENCOUNTER — Encounter: Payer: Self-pay | Admitting: Physician Assistant

## 2021-04-17 ENCOUNTER — Other Ambulatory Visit: Payer: Self-pay

## 2021-04-17 VITALS — BP 136/72 | HR 70 | Ht 61.0 in | Wt 111.0 lb

## 2021-04-17 DIAGNOSIS — M542 Cervicalgia: Secondary | ICD-10-CM

## 2021-04-17 DIAGNOSIS — G8929 Other chronic pain: Secondary | ICD-10-CM | POA: Diagnosis not present

## 2021-04-17 NOTE — Progress Notes (Signed)
Subjective:    Patient ID: Bethany Carpenter, female    DOB: 04/13/1933, 88 y.o.   MRN: [id number]  Chief Complaint  Patient presents with   Medication Management    HPI Patient is in today for medication management, follow up of her chronic pain.  Pain is about 2/10 currently in office in her back and neck. Cold weather and dampness worsens. Enjoys walking and wants to do more of it.   She will be changing her pharmacies to [facility] locally and will notify us when next refill is due. Last fill date of Tramadol was 04/08/2021.   Past Medical History:  Diagnosis Date   Allergy    Arthritis    Cancer ([place])    Skin   Colon polyps    Heart disease    History of chickenpox    Hyperlipidemia    Hypertension    Presence of permanent cardiac pacemaker 01/12/2019    Past Surgical History:  Procedure Laterality Date   ANGIOPLASTY  03/08/2007   BREAST BIOPSY  09/28/2003   CATARACT EXTRACTION  11/25/2005   04/07/2006   CERVICAL SPINE SURGERY  11/24/2012   02/02/2008   INSERT / REPLACE / REMOVE PACEMAKER  01/12/2019   PACEMAKER IMPLANT N/A 01/12/2019   Procedure: PACEMAKER IMPLANT;  Surgeon: Thompson Grayer, MD;  Location: [facility];  Service: Cardiovascular;  Laterality: N/A;   TONSILLECTOMY  1952   TUBAL LIGATION  1970    Family History  Problem Relation Age of Onset   Hearing loss Mother    Hypertension Mother    Osteoporosis Mother    Arthritis Mother    Cancer Father        Skin   Hearing loss Father    Arthritis Father    Diabetes Brother    Stroke Brother    Early death Brother    Depression Daughter    Early death Son    Early death Maternal Grandfather    Early death Paternal Grandfather     Social History   Tobacco Use   Smoking status: Never   Smokeless tobacco: Never  Vaping Use   Vaping Use: Never used  Substance Use Topics   Alcohol use: No   Drug use: No     Allergies  Allergen Reactions   Other Hives and Itching    Fragrances    Nickel    Penicillins Rash    Did it involve swelling of the face/tongue/throat, SOB, or low BP? No Did it involve sudden or severe rash/hives, skin peeling, or any reaction on the inside of your mouth or nose? Yes Did you need to seek medical attention at a hospital or doctor's office? Yes When did it last happen?      years ago  If all above answers are NO, may proceed with cephalosporin use.    Sulfa Antibiotics Rash    Review of Systems  Constitutional:  Negative for activity change, appetite change and unexpected weight change.  Eyes:  Negative for discharge.  Respiratory:  Negative for shortness of breath.   Cardiovascular:  Negative for chest pain, palpitations and leg swelling.  Musculoskeletal:  Positive for arthralgias, back pain and neck pain.  Neurological:  Negative for dizziness and headaches.  Psychiatric/Behavioral:  Negative for confusion.       Objective:     BP 136/72    Pulse 70    Ht 5\' 1"  (1.549 m)    Wt 111 lb (50.3 kg)  SpO2 98%    BMI 20.97 kg/m   Wt Readings from Last 3 Encounters:  04/17/21 111 lb (50.3 kg)  01/14/21 110 lb (49.9 kg)  10/14/20 110 lb 6.1 oz (50.1 kg)    BP Readings from Last 3 Encounters:  04/17/21 136/72  01/14/21 (!) 146/66  10/14/20 (!) 155/77     Physical Exam Vitals and nursing note reviewed.  Constitutional:      General: She is not in acute distress.    Appearance: Normal appearance. She is not ill-appearing.  HENT:     Head: Normocephalic.  Neck:     Vascular: No carotid bruit.  Cardiovascular:     Rate and Rhythm: Normal rate and regular rhythm.  Pulmonary:     Effort: Pulmonary effort is normal.     Breath sounds: Normal breath sounds.  Musculoskeletal:     Left shoulder: Decreased range of motion (external rotation limited).     Cervical back: Rigidity (chronic pain and spasm) present. No tenderness.  Lymphadenopathy:     Cervical: No cervical adenopathy.  Skin:    General: Skin is warm.   Neurological:     Mental Status: She is alert.  Psychiatric:        Mood and Affect: Mood normal.        Behavior: Behavior normal.       Assessment & Plan:   Problem List Items Addressed This Visit       Other   Chronic neck pain   Other Visit Diagnoses     Cervicalgia    -  Primary       1. Cervicalgia 2. Chronic neck pain -PDMP reviewed, no red flags.  Filling appropriately. - Doing well with tramadol 50 mg up to 4 times daily.  Not due for refill yet.  -Encouraged her to keep working towards daily walking.  F/up 3 months or sooner prn    Janell Keeling M Ixel Boehning, PA-C

## 2021-05-05 ENCOUNTER — Ambulatory Visit (INDEPENDENT_AMBULATORY_CARE_PROVIDER_SITE_OTHER): Payer: Medicare HMO

## 2021-05-05 ENCOUNTER — Other Ambulatory Visit: Payer: Self-pay | Admitting: Physician Assistant

## 2021-05-05 DIAGNOSIS — I442 Atrioventricular block, complete: Secondary | ICD-10-CM

## 2021-05-05 DIAGNOSIS — M542 Cervicalgia: Secondary | ICD-10-CM

## 2021-05-05 LAB — CUP PACEART REMOTE DEVICE CHECK
Battery Remaining Longevity: 109 mo
Battery Voltage: 3 V
Brady Statistic AP VP Percent: 7.44 %
Brady Statistic AP VS Percent: 0 %
Brady Statistic AS VP Percent: 92.5 %
Brady Statistic AS VS Percent: 0.06 %
Brady Statistic RA Percent Paced: 7.44 %
Brady Statistic RV Percent Paced: 99.94 %
Date Time Interrogation Session: 20230130041116
Implantable Lead Implant Date: 20201008
Implantable Lead Implant Date: 20201008
Implantable Lead Location: 753859
Implantable Lead Location: 753860
Implantable Lead Model: 5076
Implantable Lead Model: 5076
Implantable Pulse Generator Implant Date: 20201008
Lead Channel Impedance Value: 266 Ohm
Lead Channel Impedance Value: 323 Ohm
Lead Channel Impedance Value: 323 Ohm
Lead Channel Impedance Value: 437 Ohm
Lead Channel Pacing Threshold Amplitude: 0.5 V
Lead Channel Pacing Threshold Amplitude: 0.875 V
Lead Channel Pacing Threshold Pulse Width: 0.4 ms
Lead Channel Pacing Threshold Pulse Width: 0.4 ms
Lead Channel Sensing Intrinsic Amplitude: 1 mV
Lead Channel Sensing Intrinsic Amplitude: 1 mV
Lead Channel Sensing Intrinsic Amplitude: 17.25 mV
Lead Channel Sensing Intrinsic Amplitude: 17.25 mV
Lead Channel Setting Pacing Amplitude: 1.5 V
Lead Channel Setting Pacing Amplitude: 2.5 V
Lead Channel Setting Pacing Pulse Width: 0.4 ms
Lead Channel Setting Sensing Sensitivity: 5.6 mV

## 2021-05-06 ENCOUNTER — Encounter: Payer: Self-pay | Admitting: Physician Assistant

## 2021-05-13 ENCOUNTER — Encounter: Payer: Self-pay | Admitting: Physician Assistant

## 2021-05-13 ENCOUNTER — Other Ambulatory Visit: Payer: Self-pay

## 2021-05-13 DIAGNOSIS — I10 Essential (primary) hypertension: Secondary | ICD-10-CM

## 2021-05-13 MED ORDER — LISINOPRIL 10 MG PO TABS: 10.0000 mg | ORAL_TABLET | Freq: Two times a day (BID) | ORAL | 1 refills | Status: DC

## 2021-05-13 MED ORDER — CARVEDILOL 12.5 MG PO TABS: 12.5000 mg | ORAL_TABLET | Freq: Two times a day (BID) | ORAL | 1 refills | Status: DC

## 2021-05-13 NOTE — Progress Notes (Signed)
Remote pacemaker transmission.   

## 2021-06-04 ENCOUNTER — Other Ambulatory Visit: Payer: Self-pay | Admitting: Physician Assistant

## 2021-06-04 DIAGNOSIS — M542 Cervicalgia: Secondary | ICD-10-CM

## 2021-07-03 ENCOUNTER — Other Ambulatory Visit: Payer: Self-pay | Admitting: Physician Assistant

## 2021-07-03 DIAGNOSIS — M542 Cervicalgia: Secondary | ICD-10-CM

## 2021-07-04 ENCOUNTER — Encounter: Payer: Self-pay | Admitting: Physician Assistant

## 2021-07-16 ENCOUNTER — Ambulatory Visit (INDEPENDENT_AMBULATORY_CARE_PROVIDER_SITE_OTHER): Payer: Medicare HMO | Admitting: Physician Assistant

## 2021-07-16 ENCOUNTER — Encounter: Payer: Self-pay | Admitting: Physician Assistant

## 2021-07-16 VITALS — BP 158/69 | HR 67 | Temp 98.0°F | Ht 61.0 in | Wt 112.4 lb

## 2021-07-16 DIAGNOSIS — G8929 Other chronic pain: Secondary | ICD-10-CM

## 2021-07-16 DIAGNOSIS — M542 Cervicalgia: Secondary | ICD-10-CM | POA: Diagnosis not present

## 2021-07-16 MED ORDER — PREDNISONE 50 MG PO TABS: 50.0000 mg | ORAL_TABLET | Freq: Every day | ORAL | 0 refills | Status: DC

## 2021-07-16 NOTE — Patient Instructions (Signed)
Good to see you today as always! ? ?With your worsening neck pain, lets plan to get an updated x-ray at [facility]. ? ?Start on prednisone 50 mg once breakfast daily for the next 5 days.  This will help with any inflammation that could be contributing to pain. ? ?You may continue to take your tramadol 50 mg 4 times daily as you have been doing.  You may also take Tylenol 325 mg with each dose of tramadol for added relief. ? ?Once the x-ray is back, we can discuss next steps.  You may need to consider physical therapy at our office or consult with neurosurgery.  ? ?Let me know how you're doing!  ?

## 2021-07-16 NOTE — Progress Notes (Signed)
? ?Subjective:  ? ? Patient ID: Bethany Carpenter, female    DOB: 08/24/1933, 88 y.o.   MRN: [id number] ? ?Chief Complaint  ?Patient presents with  ? Neck Pain  ?  Pt is here to F/U with Neck pain and she feels like it is getting worse.  ? ? ?HPI ?Patient with hx cervical spine surgery is in today for follow up chronic neck pain. Pain is midline cervical region, also goes into bilateral posterior shoulder muscles. Feels like pain is worsening over the last month. 6 or 7/10 pain currently. Pt states her foot slipped going down the front step last week, but no other injuries or heavy lifting.  ? ?Still taking Tramadol 50 mg QID - started on this in about 2017.  ?Took one dose of Tylenol 325 mg, which did help some.  ? ?No weakness or numbness in arms. No headaches.  ? ?Past Medical History:  ?Diagnosis Date  ? Allergy   ? Arthritis   ? Cancer [facility])   ? Skin  ? Colon polyps   ? Heart disease   ? History of chickenpox   ? Hyperlipidemia   ? Hypertension   ? Presence of permanent cardiac pacemaker 01/12/2019  ? ? ?Past Surgical History:  ?Procedure Laterality Date  ? ANGIOPLASTY  03/08/2007  ? BREAST BIOPSY  09/28/2003  ? CATARACT EXTRACTION  11/25/2005  ? 04/07/2006  ? CERVICAL SPINE SURGERY  11/24/2012  ? 02/02/2008  ? INSERT / REPLACE / REMOVE PACEMAKER  01/12/2019  ? PACEMAKER IMPLANT N/A 01/12/2019  ? Procedure: PACEMAKER IMPLANT;  Surgeon: Thompson Grayer, MD;  Location: [facility];  Service: Cardiovascular;  Laterality: N/A;  ? TONSILLECTOMY  1952  ? TUBAL LIGATION  1970  ? ? ?Family History  ?Problem Relation Age of Onset  ? Hearing loss Mother   ? Hypertension Mother   ? Osteoporosis Mother   ? Arthritis Mother   ? Cancer Father   ?     Skin  ? Hearing loss Father   ? Arthritis Father   ? Diabetes Brother   ? Stroke Brother   ? Early death Brother   ? Depression Daughter   ? Early death Son   ? Early death Maternal Grandfather   ? Early death Paternal Grandfather   ? ? ?Social History  ? ?Tobacco Use  ? Smoking  status: Never  ? Smokeless tobacco: Never  ?Vaping Use  ? Vaping Use: Never used  ?Substance Use Topics  ? Alcohol use: No  ? Drug use: No  ?  ? ?Allergies  ?Allergen Reactions  ? Other Hives and Itching  ?  Fragrances  ? Nickel   ? Penicillins Rash  ?  Did it involve swelling of the face/tongue/throat, SOB, or low BP? No ?Did it involve sudden or severe rash/hives, skin peeling, or any reaction on the inside of your mouth or nose? Yes ?Did you need to seek medical attention at a hospital or doctor's office? Yes ?When did it last happen?      years ago  ?If all above answers are ?NO?, may proceed with cephalosporin use. ?  ? Sulfa Antibiotics Rash  ? ? ?Review of Systems ?NEGATIVE UNLESS OTHERWISE INDICATED IN HPI ? ? ?   ?Objective:  ?  ? ?BP (!) 158/69   Pulse 67   Temp 98 ?F (36.7 ?C)   Ht '5\' 1"'$  (1.549 m)   Wt 112 lb 6.4 oz (51 kg)   SpO2 98%  BMI 21.24 kg/m?  ? ?Wt Readings from Last 3 Encounters:  ?07/16/21 112 lb 6.4 oz (51 kg)  ?04/17/21 111 lb (50.3 kg)  ?01/14/21 110 lb (49.9 kg)  ? ? ?BP Readings from Last 3 Encounters:  ?07/16/21 (!) 158/69  ?04/17/21 136/72  ?01/14/21 (!) 146/66  ?  ? ?Physical Exam ?Vitals and nursing note reviewed.  ?Constitutional:   ?   General: She is not in acute distress. ?   Appearance: Normal appearance. She is not ill-appearing.  ?HENT:  ?   Head: Normocephalic.  ?Neck:  ?   Vascular: No carotid bruit.  ?Cardiovascular:  ?   Rate and Rhythm: Normal rate and regular rhythm.  ?Pulmonary:  ?   Effort: Pulmonary effort is normal.  ?   Breath sounds: Normal breath sounds.  ?Musculoskeletal:  ?   Left shoulder: Decreased range of motion (external rotation limited).  ?   Cervical back: Rigidity (chronic pain and spasm) present.  ?Lymphadenopathy:  ?   Cervical: No cervical adenopathy.  ?Skin: ?   General: Skin is warm.  ?Neurological:  ?   Mental Status: She is alert.  ?Psychiatric:     ?   Mood and Affect: Mood normal.     ?   Behavior: Behavior normal.  ? ? ?   ?Assessment &  Plan:  ? ?Problem List Items Addressed This Visit   ? ?  ? Other  ? Chronic neck pain  ? Relevant Medications  ? predniSONE (DELTASONE) 50 MG tablet  ? Other Relevant Orders  ? DG Cervical Spine Complete  ? ?Other Visit Diagnoses   ? ? Cervicalgia    -  Primary  ? Relevant Orders  ? DG Cervical Spine Complete  ? ?  ? ? ? ?Meds ordered this encounter  ?Medications  ? predniSONE (DELTASONE) 50 MG tablet  ?  Sig: Take 1 tablet (50 mg total) by mouth daily with breakfast.  ?  Dispense:  5 tablet  ?  Refill:  0  ? ?Plan: ? ?With your worsening neck pain, lets plan to get an updated x-[facility] at [facility]. No red flags to indicate need for urgent CT or MRI. Last XRAY neck done 03/08/2019.  ? ?Start on prednisone 50 mg once breakfast daily for the next 5 days.  This will help with any inflammation that could be contributing to pain. ? ?You may continue to take your tramadol 50 mg 4 times daily as you have been doing.  You may also take Tylenol 325 mg with each dose of tramadol for added relief. PDMP reviewed today, no red flags, filling appropriately.  ? ?Once the x-ray is back, we can discuss next steps.  You may need to consider physical therapy at our office or consult with neurosurgery.  ? ? ? ?This note was prepared with assistance of Systems analyst. Occasional wrong-word or sound-a-like substitutions may have occurred due to the inherent limitations of voice recognition software. ? ?Time Spent: ?30 minutes of total time was spent on the date of the encounter performing the following actions: chart review prior to seeing the patient, obtaining history, performing a medically necessary exam, counseling on the treatment plan, placing orders, and documenting in our EHR.   ? ?Sajan Cheatwood M Brannon Decaire, PA-C ?

## 2021-07-17 ENCOUNTER — Ambulatory Visit (INDEPENDENT_AMBULATORY_CARE_PROVIDER_SITE_OTHER)
Admission: RE | Admit: 2021-07-17 | Discharge: 2021-07-17 | Disposition: A | Discharge status: Home / Self Care | Payer: Medicare HMO | Source: Ambulatory Visit | Attending: Physician Assistant | Admitting: Physician Assistant

## 2021-07-17 DIAGNOSIS — G8929 Other chronic pain: Secondary | ICD-10-CM | POA: Diagnosis not present

## 2021-07-17 DIAGNOSIS — M542 Cervicalgia: Secondary | ICD-10-CM

## 2021-07-18 ENCOUNTER — Encounter: Payer: Self-pay | Admitting: Physician Assistant

## 2021-07-24 ENCOUNTER — Other Ambulatory Visit: Payer: Self-pay

## 2021-07-24 DIAGNOSIS — G8929 Other chronic pain: Secondary | ICD-10-CM

## 2021-07-24 NOTE — Telephone Encounter (Signed)
Referral to Neuro has been placed. ? ?

## 2021-08-04 ENCOUNTER — Ambulatory Visit (INDEPENDENT_AMBULATORY_CARE_PROVIDER_SITE_OTHER): Payer: Medicare HMO

## 2021-08-04 DIAGNOSIS — I442 Atrioventricular block, complete: Secondary | ICD-10-CM

## 2021-08-04 LAB — CUP PACEART REMOTE DEVICE CHECK
Battery Remaining Longevity: 103 mo
Battery Voltage: 3 V
Brady Statistic AP VP Percent: 6.11 %
Brady Statistic AP VS Percent: 0 %
Brady Statistic AS VP Percent: 93.83 %
Brady Statistic AS VS Percent: 0.06 %
Brady Statistic RA Percent Paced: 6.11 %
Brady Statistic RV Percent Paced: 99.94 %
Date Time Interrogation Session: 20230501055124
Implantable Lead Implant Date: 20201008
Implantable Lead Implant Date: 20201008
Implantable Lead Location: 753859
Implantable Lead Location: 753860
Implantable Lead Model: 5076
Implantable Lead Model: 5076
Implantable Pulse Generator Implant Date: 20201008
Lead Channel Impedance Value: 247 Ohm
Lead Channel Impedance Value: 304 Ohm
Lead Channel Impedance Value: 304 Ohm
Lead Channel Impedance Value: 399 Ohm
Lead Channel Pacing Threshold Amplitude: 0.5 V
Lead Channel Pacing Threshold Amplitude: 0.875 V
Lead Channel Pacing Threshold Pulse Width: 0.4 ms
Lead Channel Pacing Threshold Pulse Width: 0.4 ms
Lead Channel Sensing Intrinsic Amplitude: 1.375 mV
Lead Channel Sensing Intrinsic Amplitude: 1.375 mV
Lead Channel Sensing Intrinsic Amplitude: 17.25 mV
Lead Channel Sensing Intrinsic Amplitude: 17.25 mV
Lead Channel Setting Pacing Amplitude: 1.5 V
Lead Channel Setting Pacing Amplitude: 2.5 V
Lead Channel Setting Pacing Pulse Width: 0.4 ms
Lead Channel Setting Sensing Sensitivity: 5.6 mV

## 2021-08-05 ENCOUNTER — Other Ambulatory Visit: Payer: Self-pay | Admitting: Physician Assistant

## 2021-08-05 DIAGNOSIS — M542 Cervicalgia: Secondary | ICD-10-CM

## 2021-08-19 NOTE — Progress Notes (Signed)
Remote pacemaker transmission.   

## 2021-08-25 ENCOUNTER — Ambulatory Visit (INDEPENDENT_AMBULATORY_CARE_PROVIDER_SITE_OTHER): Payer: Medicare HMO | Admitting: Physician Assistant

## 2021-08-25 VITALS — BP 146/80 | HR 82 | Temp 98.1°F | Ht 61.0 in | Wt 113.2 lb

## 2021-08-25 DIAGNOSIS — L237 Allergic contact dermatitis due to plants, except food: Secondary | ICD-10-CM | POA: Diagnosis not present

## 2021-08-25 DIAGNOSIS — L509 Urticaria, unspecified: Secondary | ICD-10-CM | POA: Diagnosis not present

## 2021-08-25 MED ORDER — METHYLPREDNISOLONE ACETATE 80 MG/ML IJ SUSP
80.0000 mg | Freq: Once | INTRAMUSCULAR | Status: AC
  Administered 2021-08-25: 80 mg via INTRAMUSCULAR

## 2021-08-25 NOTE — Progress Notes (Signed)
Subjective:    Patient ID: Bethany Carpenter, female    DOB: [birth year], 88 y.o.   MRN: [id number]  Chief Complaint  Patient presents with   Urticaria    Pt c/o hives on neck face and hands since Friday; c/o itching very badly; pt has been taking Benadryl and not working, and also using ice packs to help. Pt was clipping bushes and thinks its related to that.     Urticaria  Patient is in today for acute hives. Neck, face, hands, starting three days ago after clipping some bushes Blister on right index finger Using triamcinolone cream on the worst areas Ice packs and Benadryl not helping Neg for fevers, chills, SOB, CP, chest tightness, wheezing or other symptoms   Past Medical History:  Diagnosis Date   Allergy    Arthritis    Cancer ([place])    Skin   Colon polyps    Heart disease    History of chickenpox    Hyperlipidemia    Hypertension    Presence of permanent cardiac pacemaker 01/12/2019    Past Surgical History:  Procedure Laterality Date   ANGIOPLASTY  03/08/2007   BREAST BIOPSY  09/28/2003   CATARACT EXTRACTION  11/25/2005   04/07/2006   CERVICAL SPINE SURGERY  11/24/2012   02/02/2008   INSERT / REPLACE / REMOVE PACEMAKER  01/12/2019   PACEMAKER IMPLANT N/A 01/12/2019   Procedure: PACEMAKER IMPLANT;  Surgeon: Thompson Grayer, MD;  Location: [facility];  Service: Cardiovascular;  Laterality: N/A;   TONSILLECTOMY  1952   TUBAL LIGATION  1970    Family History  Problem Relation Age of Onset   Hearing loss Mother    Hypertension Mother    Osteoporosis Mother    Arthritis Mother    Cancer Father        Skin   Hearing loss Father    Arthritis Father    Diabetes Brother    Stroke Brother    Early death Brother    Depression Daughter    Early death Son    Early death Maternal Grandfather    Early death Paternal Grandfather     Social History   Tobacco Use   Smoking status: Never   Smokeless tobacco: Never  Vaping Use   Vaping Use: Never used   Substance Use Topics   Alcohol use: No   Drug use: No     Allergies  Allergen Reactions   Other Hives and Itching    Fragrances   Nickel    Penicillins Rash    Did it involve swelling of the face/tongue/throat, SOB, or low BP? No Did it involve sudden or severe rash/hives, skin peeling, or any reaction on the inside of your mouth or nose? Yes Did you need to seek medical attention at a hospital or doctor's office? Yes When did it last happen?      years ago  If all above answers are "NO", may proceed with cephalosporin use.    Sulfa Antibiotics Rash    Review of Systems NEGATIVE UNLESS OTHERWISE INDICATED IN HPI      Objective:     BP (!) 146/80 (BP Location: Right Arm)   Pulse 82   Temp 98.1 F (36.7 C) (Temporal)   Ht '5\' 1"'$  (1.549 m)   Wt 113 lb 3.2 oz (51.3 kg)   SpO2 96%   BMI 21.39 kg/m   Wt Readings from Last 3 Encounters:  08/25/21 113 lb 3.2 oz (51.3 kg)  07/16/21 112 lb 6.4 oz (51 kg)  04/17/21 111 lb (50.3 kg)    BP Readings from Last 3 Encounters:  08/25/21 (!) 146/80  07/16/21 (!) 158/69  04/17/21 136/72     Physical Exam Constitutional:      Appearance: Normal appearance.  HENT:     Head: Normocephalic.  Cardiovascular:     Rate and Rhythm: Normal rate and regular rhythm.     Pulses: Normal pulses.     Heart sounds: Normal heart sounds.  Pulmonary:     Effort: Pulmonary effort is normal.     Breath sounds: Normal breath sounds. No wheezing.  Skin:    Findings: Rash present. Rash is urticarial (face, neck, some smaller patches starting on forearms).     Comments: Intact blister R index finger   Neurological:     Mental Status: She is alert.  Psychiatric:        Mood and Affect: Mood normal.       Assessment & Plan:   Problem List Items Addressed This Visit   None Visit Diagnoses     Allergic contact dermatitis due to plants, except food    -  Primary   Hives       Relevant Medications   methylPREDNISolone acetate  (DEPO-MEDROL) injection 80 mg        Meds ordered this encounter  Medications   methylPREDNISolone acetate (DEPO-MEDROL) injection 80 mg   1. Allergic contact dermatitis due to plants, except food 2. Hives -Secondary to outdoor plants, maybe poison oak or ivy -Keep COOL -Push fluids -REST, do NOT scratch, use ice packs / cool compresses -Depomedrol 80 mg IM in office today -Benadryl prn    Return if symptoms worsen or fail to improve.  This note was prepared with assistance of Systems analyst. Occasional wrong-word or sound-a-like substitutions may have occurred due to the inherent limitations of voice recognition software.    Ryatt Corsino M Grayling Schranz, PA-C

## 2021-08-25 NOTE — Addendum Note (Signed)
Addended by: Verlon Setting on: 08/25/2021 12:13 PM   Modules accepted: Orders

## 2021-08-25 NOTE — Patient Instructions (Signed)
-  Keep COOL -Push fluids -REST, do NOT scratch, use ice packs / cool compresses -Depomedrol 80 mg IM in office today -Benadryl prn

## 2021-08-27 ENCOUNTER — Encounter: Payer: Self-pay | Admitting: Physician Assistant

## 2021-08-27 NOTE — Telephone Encounter (Signed)
Please see patient note and advise 

## 2021-08-28 ENCOUNTER — Other Ambulatory Visit: Payer: Self-pay | Admitting: Physician Assistant

## 2021-08-28 MED ORDER — PREDNISONE 20 MG PO TABS: 20.0000 mg | ORAL_TABLET | Freq: Two times a day (BID) | ORAL | 0 refills | Status: AC

## 2021-08-28 NOTE — Telephone Encounter (Signed)
Called pt and left detailed vm with Alyssa recommendations as well as office cb number.

## 2021-08-28 NOTE — Telephone Encounter (Signed)
Please advise 

## 2021-08-29 ENCOUNTER — Other Ambulatory Visit: Payer: Self-pay | Admitting: Physician Assistant

## 2021-08-29 DIAGNOSIS — E785 Hyperlipidemia, unspecified: Secondary | ICD-10-CM

## 2021-09-04 ENCOUNTER — Other Ambulatory Visit: Payer: Self-pay | Admitting: Physician Assistant

## 2021-09-04 DIAGNOSIS — M542 Cervicalgia: Secondary | ICD-10-CM

## 2021-09-04 NOTE — Telephone Encounter (Signed)
Last visit: 08/25/21  Next visit: none scheduled  Last filled: 08/05/21  Quantity: 120

## 2021-09-30 ENCOUNTER — Other Ambulatory Visit: Payer: Self-pay | Admitting: Physician Assistant

## 2021-09-30 DIAGNOSIS — M542 Cervicalgia: Secondary | ICD-10-CM

## 2021-10-03 ENCOUNTER — Other Ambulatory Visit: Payer: Self-pay | Admitting: Physician Assistant

## 2021-10-03 ENCOUNTER — Encounter: Payer: Self-pay | Admitting: Physician Assistant

## 2021-10-06 ENCOUNTER — Other Ambulatory Visit: Payer: Self-pay | Admitting: Physician Assistant

## 2021-10-06 DIAGNOSIS — M542 Cervicalgia: Secondary | ICD-10-CM

## 2021-11-03 ENCOUNTER — Ambulatory Visit (INDEPENDENT_AMBULATORY_CARE_PROVIDER_SITE_OTHER): Payer: Medicare HMO

## 2021-11-03 ENCOUNTER — Other Ambulatory Visit: Payer: Self-pay | Admitting: Physician Assistant

## 2021-11-03 DIAGNOSIS — I442 Atrioventricular block, complete: Secondary | ICD-10-CM

## 2021-11-03 DIAGNOSIS — M542 Cervicalgia: Secondary | ICD-10-CM

## 2021-11-03 LAB — CUP PACEART REMOTE DEVICE CHECK
Battery Remaining Longevity: 101 mo
Battery Voltage: 3 V
Brady Statistic AP VP Percent: 6.36 %
Brady Statistic AP VS Percent: 0 %
Brady Statistic AS VP Percent: 93.6 %
Brady Statistic AS VS Percent: 0.04 %
Brady Statistic RA Percent Paced: 6.36 %
Brady Statistic RV Percent Paced: 99.96 %
Date Time Interrogation Session: 20230731061636
Implantable Lead Implant Date: 20201008
Implantable Lead Implant Date: 20201008
Implantable Lead Location: 753859
Implantable Lead Location: 753860
Implantable Lead Model: 5076
Implantable Lead Model: 5076
Implantable Pulse Generator Implant Date: 20201008
Lead Channel Impedance Value: 266 Ohm
Lead Channel Impedance Value: 304 Ohm
Lead Channel Impedance Value: 342 Ohm
Lead Channel Impedance Value: 399 Ohm
Lead Channel Pacing Threshold Amplitude: 0.5 V
Lead Channel Pacing Threshold Amplitude: 0.875 V
Lead Channel Pacing Threshold Pulse Width: 0.4 ms
Lead Channel Pacing Threshold Pulse Width: 0.4 ms
Lead Channel Sensing Intrinsic Amplitude: 1.125 mV
Lead Channel Sensing Intrinsic Amplitude: 1.125 mV
Lead Channel Sensing Intrinsic Amplitude: 8.25 mV
Lead Channel Sensing Intrinsic Amplitude: 8.25 mV
Lead Channel Setting Pacing Amplitude: 1.5 V
Lead Channel Setting Pacing Amplitude: 2.5 V
Lead Channel Setting Pacing Pulse Width: 0.4 ms
Lead Channel Setting Sensing Sensitivity: 5.6 mV

## 2021-11-03 NOTE — Telephone Encounter (Signed)
Last OV: 08/25/21  Next OV: none  Last filled: 09/30/21  Quantity: 120

## 2021-11-06 ENCOUNTER — Other Ambulatory Visit: Payer: Self-pay | Admitting: Physician Assistant

## 2021-11-06 DIAGNOSIS — I10 Essential (primary) hypertension: Secondary | ICD-10-CM

## 2021-12-01 ENCOUNTER — Other Ambulatory Visit: Payer: Self-pay | Admitting: Physician Assistant

## 2021-12-01 DIAGNOSIS — M542 Cervicalgia: Secondary | ICD-10-CM

## 2021-12-02 NOTE — Telephone Encounter (Signed)
Last OV: 08/25/21  Next OV: none scheduled  Last filled: 11/03/2021  Quantity: 120

## 2021-12-04 ENCOUNTER — Other Ambulatory Visit: Payer: Self-pay | Admitting: Physician Assistant

## 2021-12-04 ENCOUNTER — Encounter: Payer: Self-pay | Admitting: Physician Assistant

## 2021-12-04 DIAGNOSIS — M542 Cervicalgia: Secondary | ICD-10-CM

## 2021-12-04 NOTE — Progress Notes (Signed)
Remote pacemaker transmission.   

## 2021-12-29 ENCOUNTER — Encounter: Payer: Self-pay | Admitting: *Deleted

## 2021-12-30 ENCOUNTER — Telehealth: Payer: Self-pay | Admitting: Physician Assistant

## 2021-12-30 NOTE — Telephone Encounter (Signed)
Copied from [place] [id number]. Topic: Medicare AWV >> Dec 30, 2021  1:51 PM Devoria Glassing wrote: Reason for CRM: Left message for patient to schedule Annual Wellness Visit.  Please schedule with Nurse Health Advisor Charlott Rakes, RN at [facility]. This appt can be telephone or office visit. Please call [phone] ask for [facility]

## 2022-01-07 ENCOUNTER — Other Ambulatory Visit: Payer: Self-pay | Admitting: Physician Assistant

## 2022-01-07 DIAGNOSIS — M542 Cervicalgia: Secondary | ICD-10-CM

## 2022-01-07 NOTE — Telephone Encounter (Signed)
Last OV: 08/25/21  Next OV: none  Last filled: 12/02/21  Quantity: 120 tablets / 0 refills

## 2022-01-15 ENCOUNTER — Telehealth: Payer: Self-pay | Admitting: Physician Assistant

## 2022-01-15 DIAGNOSIS — H40043 Steroid responder, bilateral: Secondary | ICD-10-CM | POA: Diagnosis not present

## 2022-01-15 DIAGNOSIS — Z0100 Encounter for examination of eyes and vision without abnormal findings: Secondary | ICD-10-CM | POA: Diagnosis not present

## 2022-01-15 DIAGNOSIS — H1013 Acute atopic conjunctivitis, bilateral: Secondary | ICD-10-CM | POA: Diagnosis not present

## 2022-01-15 NOTE — Telephone Encounter (Signed)
Copied from [place] [id number]. Topic: Medicare AWV >> Jan 15, 2022  1:09 PM Devoria Glassing wrote: Reason for CRM: Left message for patient to schedule Annual Wellness Visit.  Please schedule with Nurse Health Advisor Charlott Rakes, RN at [facility]. This appt can be telephone or office visit. Please call [phone] ask for [facility]

## 2022-01-23 ENCOUNTER — Ambulatory Visit (INDEPENDENT_AMBULATORY_CARE_PROVIDER_SITE_OTHER): Payer: Medicare HMO

## 2022-01-23 VITALS — BP 120/76 | HR 61 | Temp 97.7°F | Wt 111.2 lb

## 2022-01-23 DIAGNOSIS — Z0000 Encounter for general adult medical examination without abnormal findings: Secondary | ICD-10-CM

## 2022-01-23 NOTE — Progress Notes (Addendum)
Subjective:   Bethany Carpenter is a 88 y.o. female who presents for Medicare Annual (Subsequent) preventive examination.  Review of Systems     Cardiac Risk Factors include: advanced age (>60mn, >>80women)     Objective:    Today's Vitals   01/23/22 1123  BP: 120/76  Pulse: 61  Temp: 97.7 F (36.5 C)  SpO2: 96%  Weight: 111 lb 3.2 oz (50.4 kg)   Body mass index is 21.01 kg/m.     01/23/2022   11:44 AM 12/28/2020   10:11 AM 03/08/2019    3:26 AM 01/13/2019    8:03 AM 01/12/2019   11:13 AM  Advanced Directives  Does Patient Have a Medical Advance Directive? Yes No No  No  Type of AParamedicof A[place]wardsonLiving will      Copy of [place] Chart? No - copy requested      Would patient like information on creating a medical advance directive?  Yes (ED - Information included in AVS) No - Patient declined No - Patient declined Yes (ED - send information to MyChart)    Current Medications (verified) Outpatient Encounter Medications as of 01/23/2022  Medication Sig   Acetaminophen (ARTHRITIS PAIN RELIEF PO) Take 500 mg by mouth.   aspirin 81 MG EC tablet Take 81 mg by mouth daily.    Calcium-Magnesium-Vitamin D (CITRACAL CALCIUM+D) 600-40-500 MG-MG-UNIT TB24 Take 2 tablets by mouth daily.   carvedilol (COREG) 12.5 MG tablet Take 1 tablet (12.5 mg total) by mouth 2 (two) times daily with a meal.   fluticasone (FLONASE) 50 MCG/ACT nasal spray Place 1 spray into both nostrils daily.   ketotifen (ZADITOR) 0.035 % ophthalmic solution 1 drop in the morning and at bedtime.   lisinopril (ZESTRIL) 10 MG tablet TAKE 1 TABLET BY MOUTH TWICE A DAY   simvastatin (ZOCOR) 40 MG tablet TAKE 1 TABLET BY MOUTH EVERY DAY   traMADol (ULTRAM) 50 MG tablet TAKE 1 TABLET BY MOUTH EVERY 6 HOURS AS NEEDED FOR MODERATE PAIN   [DISCONTINUED] prednisoLONE acetate (PRED FORTE) 1 % ophthalmic suspension 1 drop 2 (two) times daily.   [DISCONTINUED] triamcinolone  cream (KENALOG) 0.1 % Apply 1 application topically 2 (two) times daily. For 7-10 days maximum   No facility-administered encounter medications on file as of 01/23/2022.    Allergies (verified) Other, Nickel, Penicillins, and Sulfa antibiotics   History: Past Medical History:  Diagnosis Date   Allergy    Arthritis    Cancer ([place]    Skin   Colon polyps    Heart disease    History of chickenpox    Hyperlipidemia    Hypertension    Presence of permanent cardiac pacemaker 01/12/2019   Past Surgical History:  Procedure Laterality Date   ANGIOPLASTY  03/08/2007   BREAST BIOPSY  09/28/2003   CATARACT EXTRACTION  11/25/2005   04/07/2006   CERVICAL SPINE SURGERY  11/24/2012   02/02/2008   INSERT / REPLACE / REMOVE PACEMAKER  01/12/2019   PACEMAKER IMPLANT N/A 01/12/2019   Procedure: PACEMAKER IMPLANT;  Surgeon: AThompson Grayer MD;  [facility];  Service: Cardiovascular;  Laterality: N/A;   TONSILLECTOMY  1952   TUBAL LIGATION  1970   Family History  Problem Relation Age of Onset   Hearing loss Mother    Hypertension Mother    Osteoporosis Mother    Arthritis Mother    Cancer Father        Skin  Hearing loss Father    Arthritis Father    Diabetes Brother    Stroke Brother    Early death Brother    Depression Daughter    Early death Son    Early death Maternal Grandfather    Early death Paternal Grandfather    Social History   Socioeconomic History   Marital status: Divorced    Spouse name: Not on file   Number of children: Not on file   Years of education: Not on file   Highest education level: Not on file  Occupational History   Occupation: Retired  Tobacco Use   Smoking status: Never   Smokeless tobacco: Never  Vaping Use   Vaping Use: Never used  Substance and Sexual Activity   Alcohol use: No   Drug use: No   Sexual activity: Not Currently  Other Topics Concern   Not on file  Social History Narrative   Not on file   Social  Determinants of Health   Financial Resource Strain: [place]  (01/23/2022)   Overall Financial Resource Strain (CARDIA)    Difficulty of Paying Living Expenses: Not hard at all  Food Insecurity: No Food Insecurity (01/23/2022)   Hunger Vital Sign    Worried About Running Out of Food in the Last Year: Never true    [place] in the Last Year: Never true  Transportation Needs: No Transportation Needs (01/23/2022)   PRAPARE - Hydrologist (Medical): No    Lack of Transportation (Non-Medical): No  Physical Activity: Insufficiently Active (01/23/2022)   Exercise Vital Sign    Days of Exercise per Week: 3 days    Minutes of Exercise per Session: 20 min  Stress: No Stress Concern Present (01/23/2022)   [place]    Feeling of Stress : Not at all  Social Connections: Socially Isolated (01/23/2022)   Social Connection and Isolation Panel [NHANES]    Frequency of Communication with Friends and Family: Twice a week    Frequency of Social Gatherings with Friends and Family: Three times a week    Attends Religious Services: Never    Active Member of Clubs or Organizations: No    Attends Music therapist: Never    Marital Status: Divorced    Tobacco Counseling Counseling given: Not Answered   Clinical Intake:  Pre-visit preparation completed: Yes  Pain : No/denies pain     BMI - recorded: 21.01 Nutritional Status: BMI of 19-24  Normal Nutritional Risks: None Diabetes: No  How often do you need to have someone help you when you read instructions, pamphlets, or other written materials from your doctor or pharmacy?: 1 - Never  Diabetic?no  Interpreter Needed?: No  Information entered by :: Charlott Rakes, LPN   Activities of Daily Living    01/23/2022   11:46 AM  In your present state of health, do you have any difficulty performing the following activities:   Hearing? 0  Vision? 0  Difficulty concentrating or making decisions? 0  Walking or climbing stairs? 0  Dressing or bathing? 0  Doing errands, shopping? 0  Preparing Food and eating ? N  Using the Toilet? N  In the past six months, have you accidently leaked urine? Y  Comment wears a liner  Do you have problems with loss of bowel control? N  Managing your Medications? N  Managing your Finances? N  Housekeeping or managing your Housekeeping?  N    Patient Care Team: [place], Randa Evens, PA-C as PCP - General (Physician Assistant) Madelin Rear, [facility] (Inactive) as Pharmacist (Pharmacist)  Indicate any recent Medical Services you may have received from other than [facility] providers in the past year (date may be approximate).     Assessment:   This is a routine wellness examination for [facility].  Hearing/Vision screen Hearing Screening - Comments:: Pt wears hearing aids  Vision Screening - Comments:: Pt follows up with [facility] for annul eye exams   Dietary issues and exercise activities discussed: Current Exercise Habits: Home exercise routine, Type of exercise: walking, Time (Minutes): 20, Frequency (Times/Week): 3, Weekly Exercise (Minutes/Week): 60   Goals Addressed             This Visit's Progress    Patient Stated       Get back to walking        Depression Screen    01/23/2022   11:42 AM 12/28/2020   10:03 AM 08/29/2020    7:58 AM 11/07/2019   10:01 AM 08/16/2018    9:39 AM 05/02/2018   11:01 AM 01/03/2018   12:57 PM  PHQ 2/9 Scores  PHQ - 2 Score 1 0 0 0 0 0 0  PHQ- 9 Score   0  0  0    Fall Risk    01/23/2022   11:45 AM 12/28/2020   10:02 AM 08/29/2020    7:57 AM 11/07/2019   10:00 AM 05/02/2018   11:01 AM  Fall Risk   Falls in the past year? 0 0 1 0 0  Number falls in past yr: 0 0 0 0 0  Injury with Fall? 0 0 0 0 0  Risk for fall due to : Impaired vision No Fall Risks     Follow up Falls prevention discussed   Falls evaluation completed Falls evaluation  completed    FALL RISK PREVENTION PERTAINING TO THE HOME:  Any stairs in or around the home? No  If so, are there any without handrails? No  Home free of loose throw rugs in walkways, pet beds, electrical cords, etc? Yes  Adequate lighting in your home to reduce risk of falls? Yes   ASSISTIVE DEVICES UTILIZED TO PREVENT FALLS:  Life alert? No  Use of a cane, walker or w/c? No  Grab bars in the bathroom? Yes  Shower chair or bench in shower? Yes  Elevated toilet seat or a handicapped toilet? No   TIMED UP AND GO:  Was the test performed? Yes .  Length of time to ambulate 10 feet: 15 sec.   Gait steady and fast without use of assistive device  Cognitive Function:        01/23/2022   11:47 AM  6CIT Screen  What Year? 0 points  What month? 0 points  What time? 0 points  Count back from 20 0 points  Months in reverse 0 points  Repeat phrase 0 points  Total Score 0 points    Immunizations Immunization History  Administered Date(s) Administered   Fluad Quad(high Dose 65+) 12/06/2018   Influenza, High Dose Seasonal PF 01/03/2018, 12/31/2021   Influenza,inj,Quad PF,6+ Mos 01/06/2016, 01/04/2017   Influenza,inj,quad, With Preservative 01/06/2016   Influenza-Unspecified 01/06/2016, 01/05/2017, 01/02/2020, 01/10/2021   Moderna SARS-COV2 Booster Vaccination 07/16/2020, 12/27/2020   Moderna Sars-Covid-2 Vaccination 05/17/2019, 06/07/2019, 02/15/2020   Pneumococcal Conjugate-13 05/15/2015   Pneumococcal Polysaccharide-23 04/06/2001   Tdap 04/06/2012, 04/06/2012    TDAP status: Up to date  Flu Vaccine status: Up to date  Pneumococcal vaccine status: Up to date  Covid-19 vaccine status: Completed vaccines  Qualifies for Shingles Vaccine? Yes   Zostavax completed No   Shingrix Completed?: No.    Education has been provided regarding the importance of this vaccine. Patient has been advised to call insurance company to determine out of pocket expense if they have not yet  received this vaccine. Advised may also receive vaccine at local pharmacy or Health Dept. Verbalized acceptance and understanding.  Screening Tests Health Maintenance  Topic Date Due   Zoster Vaccines- Shingrix (1 of 2) Never done   COVID-19 Vaccine (4 - Moderna risk series) 02/21/2021   TETANUS/TDAP  04/06/2022   Pneumonia Vaccine 72+ Years old  Completed   INFLUENZA VACCINE  Completed   DEXA SCAN  Completed   HPV VACCINES  Aged Out    Health Maintenance  Health Maintenance Due  Topic Date Due   Zoster Vaccines- Shingrix (1 of 2) Never done   COVID-19 Vaccine (4 - Moderna risk series) 02/21/2021    Colorectal cancer screening: No longer required.   Mammogram status: No longer required due to age .  Bone Density status: Completed 02/13/20. Results reflect: Bone density results: OSTEOPOROSIS. Repeat every as directed  years.   Additional Screening:  Vision Screening: Recommended annual ophthalmology exams for early detection of glaucoma and other disorders of the eye. Is the patient up to date with their annual eye exam?  Yes  Who is the provider or what is the name of the office in which the patient attends annual eye exams? [facility]  If pt is not established with a provider, would they like to be referred to a provider to establish care? No .   Dental Screening: Recommended annual dental exams for proper oral hygiene  Community Resource Referral / Chronic Care Management: CRR required this visit?  No   CCM required this visit?  No      Plan:     I have personally reviewed and noted the following in the patient's chart:   Medical and social history Use of alcohol, tobacco or illicit drugs  Current medications and supplements including opioid prescriptions. Patient is currently taking opioid prescriptions. Information provided to patient regarding non-opioid alternatives. Patient advised to discuss non-opioid treatment plan with their provider. Functional ability  and status Nutritional status Physical activity Advanced directives List of other physicians Hospitalizations, surgeries, and ER visits in previous 12 months Vitals Screenings to include cognitive, depression, and falls Referrals and appointments  In addition, I have reviewed and discussed with patient certain preventive protocols, quality metrics, and best practice recommendations. A written personalized care plan for preventive services as well as general preventive health recommendations were provided to patient.     Willette Brace, LPN   01/31/2535   Nurse Notes:  Pt is requesting PT evaluation for complaints of chronic back pain and also requesting a referral for Cardiology  With Skeet Latch  related to a pacemaker and get blood work  [address], [place], [place] [address] Phone: [phone], Please advise

## 2022-01-23 NOTE — Patient Instructions (Addendum)
Bethany Carpenter , Thank you for taking time to come for your Medicare Wellness Visit. I appreciate your ongoing commitment to your health goals. Please review the following plan we discussed and let me know if I can assist you in the future.   These are the goals we discussed:  Goals      PharmD Care Plan     CARE PLAN ENTRY  Current Barriers:  Chronic Disease Management support, education, and care coordination needs related to  high cholesterol, high blood pressure, chronic pain.  Pharmacist Clinical Goal(s):  Over next 6 months: Maintain LDL cholesterol <100, blood pressure <140/90, notify us of any worsening pain.  Interventions: Comprehensive medication review performed.  Patient Self Care Activities:  Patient verbalizes understanding of plan to check blood pressure once weekly and notify us if readings consistently >140/90. Please call with any questions!   Initial goal documentation        This is a list of the screening recommended for you and due dates:  Health Maintenance  Topic Date Due   Zoster (Shingles) Vaccine (1 of 2) Never done   COVID-19 Vaccine (4 - Moderna risk series) 02/21/2021   Tetanus Vaccine  04/06/2022   Pneumonia Vaccine  Completed   Flu Shot  Completed   DEXA scan (bone density measurement)  Completed   HPV Vaccine  Aged Out    Advanced directives: Advance directive discussed with you today. I have provided a copy for you to complete at home and have notarized. Once this is complete please bring a copy in to our office so we can scan it into your chart.  Conditions/risks identified get back to walking more   Next appointment: Follow up in one year for your annual wellness visit    Preventive Care 65 Years and Older, Female Preventive care refers to lifestyle choices and visits with your health care provider that can promote health and wellness. What does preventive care include? A yearly physical exam. This is also called an annual well  check. Dental exams once or twice a year. Routine eye exams. Ask your health care provider how often you should have your eyes checked. Personal lifestyle choices, including: Daily care of your teeth and gums. Regular physical activity. Eating a healthy diet. Avoiding tobacco and drug use. Limiting alcohol use. Practicing safe sex. Taking low-dose aspirin every day. Taking vitamin and mineral supplements as recommended by your health care provider. What happens during an annual well check? The services and screenings done by your health care provider during your annual well check will depend on your age, overall health, lifestyle risk factors, and family history of disease. Counseling  Your health care provider may ask you questions about your: Alcohol use. Tobacco use. Drug use. Emotional well-being. Home and relationship well-being. Sexual activity. Eating habits. History of falls. Memory and ability to understand (cognition). Work and work Statistician. Reproductive health. Screening  You may have the following tests or measurements: Height, weight, and BMI. Blood pressure. Lipid and cholesterol levels. These may be checked every 5 years, or more frequently if you are over 89 years old. Skin check. Lung cancer screening. You may have this screening every year starting at age 39 if you have a 30-pack-year history of smoking and currently smoke or have quit within the past 15 years. Fecal occult blood test (FOBT) of the stool. You may have this test every year starting at age 42. Flexible sigmoidoscopy or colonoscopy. You may have a sigmoidoscopy every 5 years  or a colonoscopy every 10 years starting at age 35. Hepatitis C blood test. Hepatitis B blood test. Sexually transmitted disease (STD) testing. Diabetes screening. This is done by checking your blood sugar (glucose) after you have not eaten for a while (fasting). You may have this done every 1-3 years. Bone density scan.  This is done to screen for osteoporosis. You may have this done starting at age 36. Mammogram. This may be done every 1-2 years. Talk to your health care provider about how often you should have regular mammograms. Talk with your health care provider about your test results, treatment options, and if necessary, the need for more tests. Vaccines  Your health care provider may recommend certain vaccines, such as: Influenza vaccine. This is recommended every year. Tetanus, diphtheria, and acellular pertussis (Tdap, Td) vaccine. You may need a Td booster every 10 years. Zoster vaccine. You may need this after age 40. Pneumococcal 13-valent conjugate (PCV13) vaccine. One dose is recommended after age 10. Pneumococcal polysaccharide (PPSV23) vaccine. One dose is recommended after age 66. Talk to your health care provider about which screenings and vaccines you need and how often you need them. This information is not intended to replace advice given to you by your health care provider. Make sure you discuss any questions you have with your health care provider. Document Released: 04/19/2015 Document Revised: 12/11/2015 Document Reviewed: 01/22/2015 Elsevier Interactive Patient Education  2017 [place] Prevention in the Home Falls can cause injuries. They can happen to people of all ages. There are many things you can do to make your home safe and to help prevent falls. What can I do on the outside of my home? Regularly fix the edges of walkways and driveways and fix any cracks. Remove anything that might make you trip as you walk through a door, such as a raised step or threshold. Trim any bushes or trees on the path to your home. Use bright outdoor lighting. Clear any walking paths of anything that might make someone trip, such as rocks or tools. Regularly check to see if handrails are loose or broken. Make sure that both sides of any steps have handrails. Any raised decks and porches  should have guardrails on the edges. Have any leaves, snow, or ice cleared regularly. Use sand or salt on walking paths during winter. Clean up any spills in your garage right away. This includes oil or grease spills. What can I do in the bathroom? Use night lights. Install grab bars by the toilet and in the tub and shower. Do not use towel bars as grab bars. Use non-skid mats or decals in the tub or shower. If you need to sit down in the shower, use a plastic, non-slip stool. Keep the floor dry. Clean up any water that spills on the floor as soon as it happens. Remove soap buildup in the tub or shower regularly. Attach bath mats securely with double-sided non-slip rug tape. Do not have throw rugs and other things on the floor that can make you trip. What can I do in the bedroom? Use night lights. Make sure that you have a light by your bed that is easy to reach. Do not use any sheets or blankets that are too big for your bed. They should not hang down onto the floor. Have a firm chair that has side arms. You can use this for support while you get dressed. Do not have throw rugs and other things on the floor  that can make you trip. What can I do in the kitchen? Clean up any spills right away. Avoid walking on wet floors. Keep items that you use a lot in easy-to-reach places. If you need to reach something above you, use a strong step stool that has a grab bar. Keep electrical cords out of the way. Do not use floor polish or wax that makes floors slippery. If you must use wax, use non-skid floor wax. Do not have throw rugs and other things on the floor that can make you trip. What can I do with my stairs? Do not leave any items on the stairs. Make sure that there are handrails on both sides of the stairs and use them. Fix handrails that are broken or loose. Make sure that handrails are as long as the stairways. Check any carpeting to make sure that it is firmly attached to the stairs.  Fix any carpet that is loose or worn. Avoid having throw rugs at the top or bottom of the stairs. If you do have throw rugs, attach them to the floor with carpet tape. Make sure that you have a light switch at the top of the stairs and the bottom of the stairs. If you do not have them, ask someone to add them for you. What else can I do to help prevent falls? Wear shoes that: Do not have high heels. Have rubber bottoms. Are comfortable and fit you well. Are closed at the toe. Do not wear sandals. If you use a stepladder: Make sure that it is fully opened. Do not climb a closed stepladder. Make sure that both sides of the stepladder are locked into place. Ask someone to hold it for you, if possible. Clearly mark and make sure that you can see: Any grab bars or handrails. First and last steps. Where the edge of each step is. Use tools that help you move around (mobility aids) if they are needed. These include: Canes. Walkers. Scooters. Crutches. Turn on the lights when you go into a dark area. Replace any light bulbs as soon as they burn out. Set up your furniture so you have a clear path. Avoid moving your furniture around. If any of your floors are uneven, fix them. If there are any pets around you, be aware of where they are. Review your medicines with your doctor. Some medicines can make you feel dizzy. This can increase your chance of falling. Ask your doctor what other things that you can do to help prevent falls. This information is not intended to replace advice given to you by your health care provider. Make sure you discuss any questions you have with your health care provider. Document Released: 01/17/2009 Document Revised: 08/29/2015 Document Reviewed: 04/27/2014 Elsevier Interactive Patient Education  2017 [employer].

## 2022-02-03 ENCOUNTER — Ambulatory Visit (INDEPENDENT_AMBULATORY_CARE_PROVIDER_SITE_OTHER): Payer: Medicare HMO

## 2022-02-03 DIAGNOSIS — Z950 Presence of cardiac pacemaker: Secondary | ICD-10-CM

## 2022-02-03 DIAGNOSIS — I442 Atrioventricular block, complete: Secondary | ICD-10-CM

## 2022-02-03 LAB — CUP PACEART REMOTE DEVICE CHECK
Battery Remaining Longevity: 96 mo
Battery Voltage: 3 V
Brady Statistic AP VP Percent: 15.17 %
Brady Statistic AP VS Percent: 0 %
Brady Statistic AS VP Percent: 84.78 %
Brady Statistic AS VS Percent: 0.05 %
Brady Statistic RA Percent Paced: 15.16 %
Brady Statistic RV Percent Paced: 99.95 %
Date Time Interrogation Session: 20231031062519
Implantable Lead Connection Status: 753985
Implantable Lead Connection Status: 753985
Implantable Lead Implant Date: 20201008
Implantable Lead Implant Date: 20201008
Implantable Lead Location: 753859
Implantable Lead Location: 753860
Implantable Lead Model: 5076
Implantable Lead Model: 5076
Implantable Pulse Generator Implant Date: 20201008
Lead Channel Impedance Value: 247 Ohm
Lead Channel Impedance Value: 285 Ohm
Lead Channel Impedance Value: 342 Ohm
Lead Channel Impedance Value: 380 Ohm
Lead Channel Pacing Threshold Amplitude: 0.625 V
Lead Channel Pacing Threshold Amplitude: 0.875 V
Lead Channel Pacing Threshold Pulse Width: 0.4 ms
Lead Channel Pacing Threshold Pulse Width: 0.4 ms
Lead Channel Sensing Intrinsic Amplitude: 1 mV
Lead Channel Sensing Intrinsic Amplitude: 1 mV
Lead Channel Sensing Intrinsic Amplitude: 8.5 mV
Lead Channel Sensing Intrinsic Amplitude: 8.5 mV
Lead Channel Setting Pacing Amplitude: 1.5 V
Lead Channel Setting Pacing Amplitude: 2.5 V
Lead Channel Setting Pacing Pulse Width: 0.4 ms
Lead Channel Setting Sensing Sensitivity: 5.6 mV
Zone Setting Status: 755011
Zone Setting Status: 755011

## 2022-02-05 ENCOUNTER — Other Ambulatory Visit: Payer: Self-pay | Admitting: Physician Assistant

## 2022-02-05 DIAGNOSIS — I10 Essential (primary) hypertension: Secondary | ICD-10-CM

## 2022-02-05 DIAGNOSIS — M542 Cervicalgia: Secondary | ICD-10-CM

## 2022-02-12 ENCOUNTER — Other Ambulatory Visit: Payer: Self-pay | Admitting: Physician Assistant

## 2022-02-12 DIAGNOSIS — E785 Hyperlipidemia, unspecified: Secondary | ICD-10-CM

## 2022-02-12 DIAGNOSIS — H40043 Steroid responder, bilateral: Secondary | ICD-10-CM | POA: Diagnosis not present

## 2022-02-18 NOTE — Progress Notes (Signed)
Remote pacemaker transmission.   

## 2022-03-05 ENCOUNTER — Other Ambulatory Visit: Payer: Self-pay | Admitting: Physician Assistant

## 2022-03-05 DIAGNOSIS — M542 Cervicalgia: Secondary | ICD-10-CM

## 2022-03-05 NOTE — Telephone Encounter (Signed)
Last OV: 08/25/21  Next OV: 01/29/2023  Last filled: 02/05/22  Quantity: 120 w/ 0 refills

## 2022-04-03 ENCOUNTER — Other Ambulatory Visit: Payer: Self-pay | Admitting: Physician Assistant

## 2022-04-03 DIAGNOSIS — M542 Cervicalgia: Secondary | ICD-10-CM

## 2022-05-02 ENCOUNTER — Other Ambulatory Visit: Payer: Self-pay | Admitting: Physician Assistant

## 2022-05-02 DIAGNOSIS — I10 Essential (primary) hypertension: Secondary | ICD-10-CM

## 2022-05-05 ENCOUNTER — Other Ambulatory Visit: Payer: Self-pay | Admitting: Physician Assistant

## 2022-05-05 ENCOUNTER — Ambulatory Visit: Payer: Medicare HMO

## 2022-05-05 DIAGNOSIS — I442 Atrioventricular block, complete: Secondary | ICD-10-CM

## 2022-05-05 DIAGNOSIS — M542 Cervicalgia: Secondary | ICD-10-CM

## 2022-05-05 LAB — CUP PACEART REMOTE DEVICE CHECK
Battery Remaining Longevity: 96 mo
Battery Voltage: 2.99 V
Brady Statistic AP VP Percent: 13.86 %
Brady Statistic AP VS Percent: 0 %
Brady Statistic AS VP Percent: 86.11 %
Brady Statistic AS VS Percent: 0.03 %
Brady Statistic RA Percent Paced: 13.85 %
Brady Statistic RV Percent Paced: 99.96 %
Date Time Interrogation Session: 20240129191833
Implantable Lead Connection Status: 753985
Implantable Lead Connection Status: 753985
Implantable Lead Implant Date: 20201008
Implantable Lead Implant Date: 20201008
Implantable Lead Location: 753859
Implantable Lead Location: 753860
Implantable Lead Model: 5076
Implantable Lead Model: 5076
Implantable Pulse Generator Implant Date: 20201008
Lead Channel Impedance Value: 247 Ohm
Lead Channel Impedance Value: 304 Ohm
Lead Channel Impedance Value: 304 Ohm
Lead Channel Impedance Value: 418 Ohm
Lead Channel Pacing Threshold Amplitude: 0.5 V
Lead Channel Pacing Threshold Amplitude: 0.875 V
Lead Channel Pacing Threshold Pulse Width: 0.4 ms
Lead Channel Pacing Threshold Pulse Width: 0.4 ms
Lead Channel Sensing Intrinsic Amplitude: 1.375 mV
Lead Channel Sensing Intrinsic Amplitude: 1.375 mV
Lead Channel Sensing Intrinsic Amplitude: 13.5 mV
Lead Channel Sensing Intrinsic Amplitude: 13.5 mV
Lead Channel Setting Pacing Amplitude: 1.5 V
Lead Channel Setting Pacing Amplitude: 2.5 V
Lead Channel Setting Pacing Pulse Width: 0.4 ms
Lead Channel Setting Sensing Sensitivity: 5.6 mV
Zone Setting Status: 755011
Zone Setting Status: 755011

## 2022-05-11 ENCOUNTER — Other Ambulatory Visit: Payer: Self-pay | Admitting: Physician Assistant

## 2022-05-11 DIAGNOSIS — M542 Cervicalgia: Secondary | ICD-10-CM

## 2022-05-11 NOTE — Telephone Encounter (Signed)
  LAST APPOINTMENT DATE:  08/25/21  NEXT APPOINTMENT DATE:  MEDICATION: traMADol (ULTRAM) 50 MG tablet    Is the patient out of medication? No  PHARMACY:   [id number] - [address], [address] AT [address] [place] Phone: [phone]  Fax: [phone]       Let patient know to contact pharmacy at the end of the day to make sure medication is ready.  Please notify patient to allow 48-72 hours to process

## 2022-05-11 NOTE — Telephone Encounter (Signed)
Returned pt call but Rx was filled 05/06/22 and shouldn't be needed at this time. Advised cb number to return call if needed

## 2022-05-29 NOTE — Progress Notes (Signed)
Remote pacemaker transmission.   

## 2022-05-30 ENCOUNTER — Encounter: Payer: Self-pay | Admitting: Physician Assistant

## 2022-06-01 NOTE — Telephone Encounter (Signed)
ATTN Bethany Carpenter: Please see pt msg and call her to advise

## 2022-06-06 ENCOUNTER — Encounter: Payer: Self-pay | Admitting: Physician Assistant

## 2022-06-08 NOTE — Telephone Encounter (Signed)
Please see pt msg and advise if it is ok to send for patient

## 2022-06-10 ENCOUNTER — Other Ambulatory Visit: Payer: Self-pay | Admitting: Physician Assistant

## 2022-06-10 ENCOUNTER — Encounter: Payer: Self-pay | Admitting: Physician Assistant

## 2022-06-10 DIAGNOSIS — M542 Cervicalgia: Secondary | ICD-10-CM

## 2022-06-10 MED ORDER — TRAMADOL HCL 50 MG PO TABS: 50.0000 mg | ORAL_TABLET | Freq: Four times a day (QID) | ORAL | 0 refills | Status: DC | PRN

## 2022-06-10 NOTE — Telephone Encounter (Signed)
Rx request sent to [facility]

## 2022-06-10 NOTE — Telephone Encounter (Signed)
Last refill: 05/06/22 #120, 0 Last OV: 08/25/21 dx. allergies

## 2022-06-25 ENCOUNTER — Other Ambulatory Visit: Payer: Self-pay | Admitting: Physician Assistant

## 2022-06-25 DIAGNOSIS — I10 Essential (primary) hypertension: Secondary | ICD-10-CM

## 2022-07-08 ENCOUNTER — Other Ambulatory Visit: Payer: Self-pay | Admitting: Physician Assistant

## 2022-07-08 DIAGNOSIS — M542 Cervicalgia: Secondary | ICD-10-CM

## 2022-07-10 ENCOUNTER — Encounter: Payer: Self-pay | Admitting: Physician Assistant

## 2022-07-10 ENCOUNTER — Other Ambulatory Visit: Payer: Self-pay

## 2022-07-10 DIAGNOSIS — M542 Cervicalgia: Secondary | ICD-10-CM

## 2022-07-10 NOTE — Telephone Encounter (Signed)
Last refill: 07/08/22 #120,0 Last OV: 08/25/21 dx. Allergies

## 2022-07-13 ENCOUNTER — Encounter: Payer: Self-pay | Admitting: Physician Assistant

## 2022-07-14 ENCOUNTER — Encounter: Payer: Self-pay | Admitting: Physician Assistant

## 2022-07-14 NOTE — Telephone Encounter (Signed)
Please see pt msg and advise 

## 2022-07-15 ENCOUNTER — Ambulatory Visit (INDEPENDENT_AMBULATORY_CARE_PROVIDER_SITE_OTHER): Payer: Medicare HMO | Admitting: Physician Assistant

## 2022-07-15 VITALS — BP 159/81 | HR 80 | Temp 98.7°F | Resp 16 | Ht 61.0 in | Wt 114.0 lb

## 2022-07-15 DIAGNOSIS — E782 Mixed hyperlipidemia: Secondary | ICD-10-CM | POA: Diagnosis not present

## 2022-07-15 DIAGNOSIS — M818 Other osteoporosis without current pathological fracture: Secondary | ICD-10-CM | POA: Diagnosis not present

## 2022-07-15 DIAGNOSIS — M542 Cervicalgia: Secondary | ICD-10-CM

## 2022-07-15 DIAGNOSIS — G8929 Other chronic pain: Secondary | ICD-10-CM

## 2022-07-15 DIAGNOSIS — I10 Essential (primary) hypertension: Secondary | ICD-10-CM | POA: Diagnosis not present

## 2022-07-15 LAB — LIPID PANEL
Cholesterol: 141 mg/dL (ref 0–200)
HDL: 57.3 mg/dL (ref >39.00)
LDL Cholesterol: 69 mg/dL (ref 0–99)
NonHDL: 84.11
Total CHOL/HDL Ratio: 2
Triglycerides: 78 mg/dL (ref 0.0–149.0)
VLDL: 15.6 mg/dL (ref 0.0–40.0)

## 2022-07-15 LAB — COMPREHENSIVE METABOLIC PANEL
ALT: 18 U/L (ref 0–35)
AST: 29 U/L (ref 0–37)
Albumin: 4.4 g/dL (ref 3.5–5.2)
Alkaline Phosphatase: 61 U/L (ref 39–117)
BUN: 19 mg/dL (ref 6–23)
CO2: 30 mEq/L (ref 19–32)
Calcium: 9.7 mg/dL (ref 8.4–10.5)
Chloride: 102 mEq/L (ref 96–112)
Creatinine, Ser: 0.77 mg/dL (ref 0.40–1.20)
GFR: 69.87 mL/min (ref >60.00)
Glucose, Bld: 71 mg/dL (ref 70–99)
Potassium: 4.3 mEq/L (ref 3.5–5.1)
Sodium: 138 mEq/L (ref 135–145)
Total Bilirubin: 0.4 mg/dL (ref 0.2–1.2)
Total Protein: 6.9 g/dL (ref 6.0–8.3)

## 2022-07-15 LAB — CBC WITH DIFFERENTIAL/PLATELET
Basophils Absolute: 0 10*3/uL (ref 0.0–0.1)
Basophils Relative: 1 % (ref 0.0–3.0)
Eosinophils Absolute: 0.1 10*3/uL (ref 0.0–0.7)
Eosinophils Relative: 1.4 % (ref 0.0–5.0)
HCT: 35.8 % — ABNORMAL LOW (ref 36.0–46.0)
Hemoglobin: 12.1 g/dL (ref 12.0–15.0)
Lymphocytes Relative: 36.1 % (ref 12.0–46.0)
Lymphs Abs: 1.5 10*3/uL (ref 0.7–4.0)
MCHC: 33.7 g/dL (ref 30.0–36.0)
MCV: 89.1 fl (ref 78.0–100.0)
Monocytes Absolute: 0.5 10*3/uL (ref 0.1–1.0)
Monocytes Relative: 12.5 % — ABNORMAL HIGH (ref 3.0–12.0)
Neutro Abs: 2.1 10*3/uL (ref 1.4–7.7)
Neutrophils Relative %: 49 % (ref 43.0–77.0)
Platelets: 264 10*3/uL (ref 150.0–400.0)
RBC: 4.02 Mil/uL (ref 3.87–5.11)
RDW: 14.4 % (ref 11.5–15.5)
WBC: 4.3 10*3/uL (ref 4.0–10.5)

## 2022-07-15 LAB — VITAMIN D 25 HYDROXY (VIT D DEFICIENCY, FRACTURES): VITD: 41.88 ng/mL (ref 30.00–100.00)

## 2022-07-15 MED ORDER — TRAMADOL HCL 50 MG PO TABS: 50.0000 mg | ORAL_TABLET | Freq: Four times a day (QID) | ORAL | 0 refills | Status: DC | PRN

## 2022-07-15 NOTE — Progress Notes (Signed)
Subjective:    Patient ID: Bethany Carpenter, female    DOB: 1934/02/13, 89 y.o.   MRN: [id number]  Chief Complaint  Patient presents with   Medication Refill    Medication Refill   Patient is in today for medication refill; overall recheck. See A/P for details.   Past Medical History:  Diagnosis Date   Allergy    Arthritis    Cancer    Skin   Colon polyps    Heart disease    History of chickenpox    Hyperlipidemia    Hypertension    Presence of permanent cardiac pacemaker 01/12/2019    Past Surgical History:  Procedure Laterality Date   ANGIOPLASTY  03/08/2007   BREAST BIOPSY  09/28/2003   CATARACT EXTRACTION  11/25/2005   04/07/2006   CERVICAL SPINE SURGERY  11/24/2012   02/02/2008   INSERT / REPLACE / REMOVE PACEMAKER  01/12/2019   PACEMAKER IMPLANT N/A 01/12/2019   Procedure: PACEMAKER IMPLANT;  Surgeon: Hillis Range, MD;  Location: [facility];  Service: Cardiovascular;  Laterality: N/A;   TONSILLECTOMY  1952   TUBAL LIGATION  1970    Family History  Problem Relation Age of Onset   Hearing loss Mother    Hypertension Mother    Osteoporosis Mother    Arthritis Mother    Cancer Father        Skin   Hearing loss Father    Arthritis Father    Diabetes Brother    Stroke Brother    Early death Brother    Depression Daughter    Early death Son    Early death Maternal Grandfather    Early death Paternal Grandfather     Social History   Tobacco Use   Smoking status: Never   Smokeless tobacco: Never  Vaping Use   Vaping Use: Never used  Substance Use Topics   Alcohol use: No   Drug use: No     Allergies  Allergen Reactions   Other Hives and Itching    Fragrances   Nickel    Penicillins Rash    Did it involve swelling of the face/tongue/throat, SOB, or low BP? No Did it involve sudden or severe rash/hives, skin peeling, or any reaction on the inside of your mouth or nose? Yes Did you need to seek medical attention at a hospital or  doctor's office? Yes When did it last happen?      years ago  If all above answers are "NO", may proceed with cephalosporin use.    Sulfa Antibiotics Rash    Review of Systems NEGATIVE UNLESS OTHERWISE INDICATED IN HPI      Objective:     BP (!) 159/81 (BP Location: Left Arm)   Pulse 80   Temp 98.7 F (37.1 C) (Temporal)   Resp 16   Ht 5\' 1"  (1.549 m)   Wt 114 lb (51.7 kg)   SpO2 96%   BMI 21.54 kg/m   Wt Readings from Last 3 Encounters:  07/15/22 114 lb (51.7 kg)  01/23/22 111 lb 3.2 oz (50.4 kg)  08/25/21 113 lb 3.2 oz (51.3 kg)    BP Readings from Last 3 Encounters:  07/15/22 (!) 159/81  01/23/22 120/76  08/25/21 (!) 146/80     Physical Exam Vitals and nursing note reviewed.  Constitutional:      General: She is not in acute distress.    Appearance: Normal appearance. She is not ill-appearing.  HENT:  Head: Normocephalic.  Neck:     Vascular: No carotid bruit.  Cardiovascular:     Rate and Rhythm: Normal rate and regular rhythm.     Comments: pacemaker Pulmonary:     Effort: Pulmonary effort is normal.     Breath sounds: Normal breath sounds.  Musculoskeletal:     Left shoulder: Decreased range of motion (external rotation limited).     Cervical back: Rigidity (chronic pain and spasm) present.  Lymphadenopathy:     Cervical: No cervical adenopathy.  Skin:    General: Skin is warm.  Neurological:     Mental Status: She is alert.  Psychiatric:        Mood and Affect: Mood normal.        Behavior: Behavior normal.        Assessment & Plan:  Chronic neck pain Assessment & Plan: Stable on Tramadol 50 mg QID - has taken this for years. PDMP reviewed today, no red flags, filling appropriately.  Also takes Tylenol 500 mg daily and heating pad to help with pain.  Sleeps well, pain doesn't keep her awake.  Denies any dizziness or other side effects.    Essential hypertension Assessment & Plan: Elevated today. Asked her to monitor / log at  home. Goal 130-140/80. Cont Lisinopril 20 mg; Coreg 12.5 mg BID.   Orders: -     CBC with Differential/Platelet -     Comprehensive metabolic panel -     Lipid panel  Mixed hyperlipidemia Assessment & Plan: Will update labs today Stable on Zocor 40 mg daily   Orders: -     Lipid panel  Other osteoporosis without current pathological fracture Assessment & Plan: Due to update DEXA scan this year, last done Nov 2021 Takes Citracal Calcium + D No falls or fractures in the last two years.   Hx fracture cervical spine - [place], [place], prior to 2018 ?   Never has had treatment otherwise that she is aware of. See DEXA this year, discuss options such as Fosamax, Prolia, Evenity.    Orders: -     DG Bone Density; Future -     CBC with Differential/Platelet -     Comprehensive metabolic panel -     VITAMIN D 25 Hydroxy (Vit-D Deficiency, Fractures)  Cervicalgia -     traMADol HCl; Take 1 tablet (50 mg total) by mouth every 6 (six) hours as needed for moderate pain.  Dispense: 120 tablet; Refill: 0        Return in about 3 months (around 10/14/2022) for med recheck .     Brytnee Bechler M Paxson Harrower, PA-C

## 2022-07-15 NOTE — Assessment & Plan Note (Signed)
Elevated today. Asked her to monitor / log at home. Goal 130-140/80. Cont Lisinopril 20 mg; Coreg 12.5 mg BID.

## 2022-07-15 NOTE — Assessment & Plan Note (Signed)
Stable on Tramadol 50 mg QID - has taken this for years. PDMP reviewed today, no red flags, filling appropriately.  Also takes Tylenol 500 mg daily and heating pad to help with pain.  Sleeps well, pain doesn't keep her awake.  Denies any dizziness or other side effects.

## 2022-07-15 NOTE — Assessment & Plan Note (Addendum)
Due to update DEXA scan this year, last done Nov 2021 Takes Citracal Calcium + D No falls or fractures in the last two years.   Hx fracture cervical spine - [place], [place], prior to 2018 ?   Never has had treatment otherwise that she is aware of. See DEXA this year, discuss options such as Fosamax, Prolia, Evenity.

## 2022-07-15 NOTE — Assessment & Plan Note (Signed)
Will update labs today Stable on Zocor 40 mg daily

## 2022-08-04 ENCOUNTER — Ambulatory Visit (INDEPENDENT_AMBULATORY_CARE_PROVIDER_SITE_OTHER): Payer: Medicare HMO

## 2022-08-04 DIAGNOSIS — I442 Atrioventricular block, complete: Secondary | ICD-10-CM | POA: Diagnosis not present

## 2022-08-05 LAB — CUP PACEART REMOTE DEVICE CHECK
Battery Remaining Longevity: 92 mo
Battery Voltage: 2.99 V
Brady Statistic AP VP Percent: 12.29 %
Brady Statistic AP VS Percent: 0 %
Brady Statistic AS VP Percent: 87.69 %
Brady Statistic AS VS Percent: 0.03 %
Brady Statistic RA Percent Paced: 12.27 %
Brady Statistic RV Percent Paced: 99.97 %
Date Time Interrogation Session: 20240430022626
Implantable Lead Connection Status: 753985
Implantable Lead Connection Status: 753985
Implantable Lead Implant Date: 20201008
Implantable Lead Implant Date: 20201008
Implantable Lead Location: 753859
Implantable Lead Location: 753860
Implantable Lead Model: 5076
Implantable Lead Model: 5076
Implantable Pulse Generator Implant Date: 20201008
Lead Channel Impedance Value: 247 Ohm
Lead Channel Impedance Value: 304 Ohm
Lead Channel Impedance Value: 304 Ohm
Lead Channel Impedance Value: 399 Ohm
Lead Channel Pacing Threshold Amplitude: 0.5 V
Lead Channel Pacing Threshold Amplitude: 0.75 V
Lead Channel Pacing Threshold Pulse Width: 0.4 ms
Lead Channel Pacing Threshold Pulse Width: 0.4 ms
Lead Channel Sensing Intrinsic Amplitude: 1 mV
Lead Channel Sensing Intrinsic Amplitude: 1 mV
Lead Channel Sensing Intrinsic Amplitude: 14.25 mV
Lead Channel Sensing Intrinsic Amplitude: 14.25 mV
Lead Channel Setting Pacing Amplitude: 1.5 V
Lead Channel Setting Pacing Amplitude: 2.5 V
Lead Channel Setting Pacing Pulse Width: 0.4 ms
Lead Channel Setting Sensing Sensitivity: 5.6 mV
Zone Setting Status: 755011
Zone Setting Status: 755011

## 2022-08-12 ENCOUNTER — Other Ambulatory Visit: Payer: Self-pay

## 2022-08-12 ENCOUNTER — Encounter: Payer: Self-pay | Admitting: Physician Assistant

## 2022-08-12 DIAGNOSIS — M542 Cervicalgia: Secondary | ICD-10-CM

## 2022-08-12 MED ORDER — TRAMADOL HCL 50 MG PO TABS
50.0000 mg | ORAL_TABLET | Freq: Four times a day (QID) | ORAL | 0 refills | Status: DC | PRN
Start: 2022-08-12 — End: 2022-09-10

## 2022-08-12 NOTE — Telephone Encounter (Signed)
Sent to be refilled via encounter

## 2022-08-12 NOTE — Telephone Encounter (Signed)
Last OV: 07/15/22  Next OV: 10/14/22  Last filled: 07/15/22  Quantity: 120 tablets w/ 0 refills

## 2022-08-26 NOTE — Progress Notes (Signed)
Remote pacemaker transmission.   

## 2022-09-10 ENCOUNTER — Other Ambulatory Visit: Payer: Self-pay | Admitting: Physician Assistant

## 2022-09-10 ENCOUNTER — Encounter: Payer: Self-pay | Admitting: Physician Assistant

## 2022-09-10 DIAGNOSIS — M542 Cervicalgia: Secondary | ICD-10-CM

## 2022-09-10 MED ORDER — TRAMADOL HCL 50 MG PO TABS
50.0000 mg | ORAL_TABLET | Freq: Four times a day (QID) | ORAL | 2 refills | Status: DC | PRN
Start: 2022-09-10 — End: 2022-10-07

## 2022-09-10 NOTE — Telephone Encounter (Signed)
Please see pt request

## 2022-09-19 ENCOUNTER — Other Ambulatory Visit: Payer: Self-pay | Admitting: Physician Assistant

## 2022-09-19 DIAGNOSIS — E785 Hyperlipidemia, unspecified: Secondary | ICD-10-CM

## 2022-10-07 ENCOUNTER — Other Ambulatory Visit: Payer: Self-pay | Admitting: Physician Assistant

## 2022-10-07 ENCOUNTER — Encounter: Payer: Self-pay | Admitting: Physician Assistant

## 2022-10-07 DIAGNOSIS — M542 Cervicalgia: Secondary | ICD-10-CM

## 2022-10-07 MED ORDER — TRAMADOL HCL 50 MG PO TABS
50.0000 mg | ORAL_TABLET | Freq: Four times a day (QID) | ORAL | 2 refills | Status: DC | PRN
Start: 2022-10-07 — End: 2022-11-09

## 2022-10-07 NOTE — Telephone Encounter (Signed)
Please see pt request and advise 

## 2022-10-14 ENCOUNTER — Ambulatory Visit: Payer: Medicare HMO | Admitting: Physician Assistant

## 2022-10-14 ENCOUNTER — Encounter: Payer: Self-pay | Admitting: Physician Assistant

## 2022-10-14 VITALS — BP 128/68 | HR 70 | Temp 98.2°F | Ht 61.0 in | Wt 110.0 lb

## 2022-10-14 DIAGNOSIS — M818 Other osteoporosis without current pathological fracture: Secondary | ICD-10-CM

## 2022-10-14 DIAGNOSIS — I10 Essential (primary) hypertension: Secondary | ICD-10-CM

## 2022-10-14 DIAGNOSIS — G8929 Other chronic pain: Secondary | ICD-10-CM | POA: Diagnosis not present

## 2022-10-14 DIAGNOSIS — M542 Cervicalgia: Secondary | ICD-10-CM

## 2022-10-14 NOTE — Assessment & Plan Note (Addendum)
Normotensive today, some WCS history. Runs 130s/70s at home.. Cont Lisinopril 20 mg; Coreg 12.5 mg BID.

## 2022-10-14 NOTE — Assessment & Plan Note (Addendum)
Stable on Tramadol 50 mg QID - has taken this for years. PDMP reviewed today, no red flags, filling appropriately.  Last fill on 10/11/22. Also takes Tylenol 500 mg daily and heating pad to help with pain.  Sleeps well, pain doesn't keep her awake.  Denies any dizziness or other side effects.

## 2022-10-14 NOTE — Progress Notes (Signed)
Subjective:    Patient ID: Bethany Carpenter, female    DOB: [birth year], 89 y.o.   MRN: [id number]  Chief Complaint  Patient presents with   Medical Management of Chronic Issues    Pt in office for 3 mon f/u;     Medication Refill   Patient is in today for medication refill; overall recheck. No medical changes in interim. See A/P for details.   Past Medical History:  Diagnosis Date   Allergy    Arthritis    Cancer (HCC)    Skin   Colon polyps    Heart disease    History of chickenpox    Hyperlipidemia    Hypertension    Presence of permanent cardiac pacemaker 01/12/2019    Past Surgical History:  Procedure Laterality Date   ANGIOPLASTY  03/08/2007   BREAST BIOPSY  09/28/2003   CATARACT EXTRACTION  11/25/2005   04/07/2006   CERVICAL SPINE SURGERY  11/24/2012   02/02/2008   INSERT / REPLACE / REMOVE PACEMAKER  01/12/2019   PACEMAKER IMPLANT N/A 01/12/2019   Procedure: PACEMAKER IMPLANT;  Surgeon: Hillis Range, MD;  Location: [facility];  Service: Cardiovascular;  Laterality: N/A;   TONSILLECTOMY  1952   TUBAL LIGATION  1970    Family History  Problem Relation Age of Onset   Hearing loss Mother    Hypertension Mother    Osteoporosis Mother    Arthritis Mother    Cancer Father        Skin   Hearing loss Father    Arthritis Father    Diabetes Brother    Stroke Brother    Early death Brother    Depression Daughter    Early death Son    Early death Maternal Grandfather    Early death Paternal Grandfather     Social History   Tobacco Use   Smoking status: Never   Smokeless tobacco: Never  Vaping Use   Vaping Use: Never used  Substance Use Topics   Alcohol use: No   Drug use: No     Allergies  Allergen Reactions   Other Hives and Itching    Fragrances   Nickel    Penicillins Rash    Did it involve swelling of the face/tongue/throat, SOB, or low BP? No Did it involve sudden or severe rash/hives, skin peeling, or any reaction on the inside  of your mouth or nose? Yes Did you need to seek medical attention at a hospital or doctor's office? Yes When did it last happen?      years ago  If all above answers are "NO", may proceed with cephalosporin use.    Sulfa Antibiotics Rash    Review of Systems NEGATIVE UNLESS OTHERWISE INDICATED IN HPI      Objective:     BP 128/68 (BP Location: Left Arm)   Pulse 70   Temp 98.2 F (36.8 C) (Temporal)   Ht 5\' 1"  (1.549 m)   Wt 110 lb (49.9 kg)   SpO2 98%   BMI 20.78 kg/m   Wt Readings from Last 3 Encounters:  10/14/22 110 lb (49.9 kg)  07/15/22 114 lb (51.7 kg)  01/23/22 111 lb 3.2 oz (50.4 kg)    BP Readings from Last 3 Encounters:  10/14/22 128/68  07/15/22 (!) 159/81  01/23/22 120/76     Physical Exam Vitals and nursing note reviewed.  Constitutional:      General: Bethany Carpenter is not in acute distress.    Appearance:  Normal appearance. Bethany Carpenter is not ill-appearing.  HENT:     Head: Normocephalic.  Neck:     Vascular: No carotid bruit.  Cardiovascular:     Rate and Rhythm: Normal rate and regular rhythm.     Comments: pacemaker Pulmonary:     Effort: Pulmonary effort is normal.     Breath sounds: Normal breath sounds.  Musculoskeletal:     Left shoulder: Decreased range of motion (external rotation limited).     Cervical back: Rigidity (chronic pain and spasm) present.  Lymphadenopathy:     Cervical: No cervical adenopathy.  Skin:    General: Skin is warm.  Neurological:     Mental Status: Bethany Carpenter is alert.  Psychiatric:        Mood and Affect: Mood normal.        Behavior: Behavior normal.        Assessment & Plan:  Chronic neck pain Assessment & Plan: Stable on Tramadol 50 mg QID - has taken this for years. PDMP reviewed today, no red flags, filling appropriately.  Last fill on 10/11/22. Also takes Tylenol 500 mg daily and heating pad to help with pain.  Sleeps well, pain doesn't keep her awake.  Denies any dizziness or other side effects.    Essential  hypertension Assessment & Plan: Normotensive today, some WCS history. Runs 130s/70s at home.. Cont Lisinopril 20 mg; Coreg 12.5 mg BID.    Other osteoporosis without current pathological fracture Assessment & Plan: Due to update DEXA scan this year, last done Nov 2021 Takes Citracal Calcium + D No falls or fractures in the last two years.   Hx fracture cervical spine - [place], [place], prior to 2018 ?   Never has had treatment otherwise that Bethany Carpenter is aware of. DEXA scheduled for later this year, then can discuss options such as Fosamax, Prolia, Evenity.           Return in about 3 months (around 01/14/2023) for recheck/follow-up.     Bethany Charrier M Lason Eveland, PA-C

## 2022-10-14 NOTE — Assessment & Plan Note (Signed)
Due to update DEXA scan this year, last done Nov 2021 Takes Citracal Calcium + D No falls or fractures in the last two years.   Hx fracture cervical spine - [place], [place], prior to 2018 ?   Never has had treatment otherwise that she is aware of. DEXA scheduled for later this year, then can discuss options such as Fosamax, Prolia, Evenity.

## 2022-10-27 ENCOUNTER — Encounter: Payer: Self-pay | Admitting: Physician Assistant

## 2022-10-28 ENCOUNTER — Other Ambulatory Visit: Payer: Self-pay | Admitting: Physician Assistant

## 2022-10-28 DIAGNOSIS — I10 Essential (primary) hypertension: Secondary | ICD-10-CM

## 2022-10-29 ENCOUNTER — Ambulatory Visit: Payer: Medicare HMO | Admitting: Family Medicine

## 2022-10-29 ENCOUNTER — Ambulatory Visit (INDEPENDENT_AMBULATORY_CARE_PROVIDER_SITE_OTHER): Payer: Medicare HMO | Admitting: Physician Assistant

## 2022-10-29 ENCOUNTER — Encounter: Payer: Self-pay | Admitting: Physician Assistant

## 2022-10-29 VITALS — BP 160/84 | HR 71 | Temp 97.5°F | Ht 61.0 in | Wt 112.4 lb

## 2022-10-29 DIAGNOSIS — R1030 Lower abdominal pain, unspecified: Secondary | ICD-10-CM | POA: Diagnosis not present

## 2022-10-29 DIAGNOSIS — R195 Other fecal abnormalities: Secondary | ICD-10-CM | POA: Diagnosis not present

## 2022-10-29 LAB — CBC WITH DIFFERENTIAL/PLATELET
Basophils Absolute: 0 10*3/uL (ref 0.0–0.1)
Basophils Relative: 0.9 % (ref 0.0–3.0)
Eosinophils Absolute: 0.1 10*3/uL (ref 0.0–0.7)
Eosinophils Relative: 2.1 % (ref 0.0–5.0)
HCT: 34.2 % — ABNORMAL LOW (ref 36.0–46.0)
Hemoglobin: 11.1 g/dL — ABNORMAL LOW (ref 12.0–15.0)
Lymphocytes Relative: 23.4 % (ref 12.0–46.0)
Lymphs Abs: 1.1 10*3/uL (ref 0.7–4.0)
MCHC: 32.4 g/dL (ref 30.0–36.0)
MCV: 90.2 fl (ref 78.0–100.0)
Monocytes Absolute: 0.6 10*3/uL (ref 0.1–1.0)
Monocytes Relative: 12.1 % — ABNORMAL HIGH (ref 3.0–12.0)
Neutro Abs: 2.9 10*3/uL (ref 1.4–7.7)
Neutrophils Relative %: 61.5 % (ref 43.0–77.0)
Platelets: 264 10*3/uL (ref 150.0–400.0)
RBC: 3.8 Mil/uL — ABNORMAL LOW (ref 3.87–5.11)
RDW: 13.4 % (ref 11.5–15.5)
WBC: 4.7 10*3/uL (ref 4.0–10.5)

## 2022-10-29 LAB — POC URINALSYSI DIPSTICK (AUTOMATED)
Bilirubin, UA: POSITIVE
Blood, UA: POSITIVE
Glucose, UA: NEGATIVE
Ketones, UA: NEGATIVE
Leukocytes, UA: NEGATIVE
Nitrite, UA: NEGATIVE
Protein, UA: POSITIVE — AB
Spec Grav, UA: >=1.03 — AB (ref 1.010–1.025)
Urobilinogen, UA: 0.2 E.U./dL
pH, UA: 5 (ref 5.0–8.0)

## 2022-10-29 LAB — COMPREHENSIVE METABOLIC PANEL
ALT: 10 U/L (ref 0–35)
AST: 21 U/L (ref 0–37)
Albumin: 3.9 g/dL (ref 3.5–5.2)
Alkaline Phosphatase: 68 U/L (ref 39–117)
BUN: 18 mg/dL (ref 6–23)
CO2: 31 mEq/L (ref 19–32)
Calcium: 9.3 mg/dL (ref 8.4–10.5)
Chloride: 101 mEq/L (ref 96–112)
Creatinine, Ser: 0.72 mg/dL (ref 0.40–1.20)
GFR: 75.58 mL/min (ref >60.00)
Glucose, Bld: 101 mg/dL — ABNORMAL HIGH (ref 70–99)
Potassium: 4 mEq/L (ref 3.5–5.1)
Sodium: 137 mEq/L (ref 135–145)
Total Bilirubin: 0.3 mg/dL (ref 0.2–1.2)
Total Protein: 6.9 g/dL (ref 6.0–8.3)

## 2022-10-29 NOTE — Addendum Note (Signed)
Addended by: Ila Mcgill on: 10/29/2022 01:45 PM   Modules accepted: Orders

## 2022-10-29 NOTE — Progress Notes (Signed)
Subjective:    Patient ID: Skeet Latch, female    DOB: October 27, 1933, 89 y.o.   MRN: [id number]  Chief Complaint  Patient presents with   Bloated    No NVD no blood in stool Possible Colitis     HPI Patient is in today for possible colitis x 1 week.  Doesn't remember ever having diverticulitis previously. First day had cramping and mucous all night. No nausea or vomiting.  Bloating, gas, passing mucous continued since then.  Eating oatmeal, cereal, bread and cheese.  Today - BM more solid, still having mucous, lower abdominal pain dull and constant   No antibiotics recently. No recent travel. No sick contacts.   No blood in the stool.   Past Medical History:  Diagnosis Date   Allergy    Arthritis    Cancer (HCC)    Skin   Colon polyps    Heart disease    History of chickenpox    Hyperlipidemia    Hypertension    Presence of permanent cardiac pacemaker 01/12/2019    Past Surgical History:  Procedure Laterality Date   ANGIOPLASTY  03/08/2007   BREAST BIOPSY  09/28/2003   CATARACT EXTRACTION  11/25/2005   04/07/2006   CERVICAL SPINE SURGERY  11/24/2012   02/02/2008   INSERT / REPLACE / REMOVE PACEMAKER  01/12/2019   PACEMAKER IMPLANT N/A 01/12/2019   Procedure: PACEMAKER IMPLANT;  Surgeon: Hillis Range, MD;  Location: [facility];  Service: Cardiovascular;  Laterality: N/A;   TONSILLECTOMY  1952   TUBAL LIGATION  1970    Family History  Problem Relation Age of Onset   Hearing loss Mother    Hypertension Mother    Osteoporosis Mother    Arthritis Mother    Cancer Father        Skin   Hearing loss Father    Arthritis Father    Diabetes Brother    Stroke Brother    Early death Brother    Depression Daughter    Early death Son    Early death Maternal Grandfather    Early death Paternal Grandfather     Social History   Tobacco Use   Smoking status: Never   Smokeless tobacco: Never  Vaping Use   Vaping status: Never Used  Substance Use  Topics   Alcohol use: No   Drug use: No     Allergies  Allergen Reactions   Other Hives and Itching    Fragrances   Nickel    Penicillins Rash    Did it involve swelling of the face/tongue/throat, SOB, or low BP? No Did it involve sudden or severe rash/hives, skin peeling, or any reaction on the inside of your mouth or nose? Yes Did you need to seek medical attention at a hospital or doctor's office? Yes When did it last happen?      years ago  If all above answers are "NO", may proceed with cephalosporin use.    Sulfa Antibiotics Rash    Review of Systems NEGATIVE UNLESS OTHERWISE INDICATED IN HPI      Objective:     BP (!) 171/71   Pulse 71   Temp (!) 97.5 F (36.4 C) (Temporal)   Ht 5\' 1"  (1.549 m)   Wt 112 lb 6.4 oz (51 kg)   SpO2 98%   BMI 21.24 kg/m   Wt Readings from Last 3 Encounters:  10/29/22 112 lb 6.4 oz (51 kg)  10/14/22 110 lb (49.9 kg)  07/15/22 114 lb (51.7 kg)    BP Readings from Last 3 Encounters:  10/29/22 (!) 171/71  10/14/22 128/68  07/15/22 (!) 159/81     Physical Exam Vitals and nursing note reviewed.  Constitutional:      Appearance: Normal appearance.  Cardiovascular:     Rate and Rhythm: Normal rate and regular rhythm.     Pulses: Normal pulses.  Pulmonary:     Effort: Pulmonary effort is normal.     Breath sounds: Normal breath sounds.  Abdominal:     General: Abdomen is flat. Bowel sounds are normal.     Palpations: Abdomen is soft. There is no mass.     Tenderness: There is abdominal tenderness (diffuse lower abdomen). There is no right CVA tenderness or left CVA tenderness.  Skin:    Findings: No rash.  Neurological:     Mental Status: She is alert.  Psychiatric:        Mood and Affect: Mood normal.        Assessment & Plan:  Mucus in stool -     CT ABDOMEN PELVIS W CONTRAST; Future -     CBC with Differential/Platelet -     Comprehensive metabolic panel  Lower abdominal pain -     CT ABDOMEN PELVIS W  CONTRAST; Future -     CBC with Differential/Platelet -     Comprehensive metabolic panel   1 week of ongoing symptoms in an 89 year old female with lower abdominal pain, mucus in the stool.  States that she might of had a history of colitis in the past, but knows there are different kinds.  She is unsure if she has ever had diverticulitis.  Her last colonoscopy was maybe 15 years ago or more and she thinks she had some polyps.  She does not state on a regular colonoscopy schedule.  She is stable on exam today.  There has been no blood in her stool.  I think it is reasonable at this time to get stat labs and a stat CT of the abdomen and pelvis.  We can treat pending results.  Should she experience any sudden worsening pain, vomiting, fever, blood in the stool, she will go right to the ER.  Patient is agreeable and understanding the plan.     Return if symptoms worsen or fail to improve.    Kaylena Pacifico M Kirandeep Fariss, PA-C

## 2022-10-29 NOTE — Addendum Note (Signed)
Addended by: Ila Mcgill on: 10/29/2022 01:23 PM   Modules accepted: Orders

## 2022-10-30 ENCOUNTER — Ambulatory Visit (HOSPITAL_BASED_OUTPATIENT_CLINIC_OR_DEPARTMENT_OTHER)
Admission: RE | Admit: 2022-10-30 | Discharge: 2022-10-30 | Disposition: A | Discharge status: Home / Self Care | Payer: Medicare HMO | Source: Ambulatory Visit | Attending: Physician Assistant | Admitting: Physician Assistant

## 2022-10-30 ENCOUNTER — Other Ambulatory Visit: Payer: Self-pay | Admitting: Physician Assistant

## 2022-10-30 ENCOUNTER — Ambulatory Visit: Payer: Medicare HMO | Admitting: Physician Assistant

## 2022-10-30 ENCOUNTER — Ambulatory Visit (HOSPITAL_COMMUNITY): Payer: Medicare HMO | Attending: Physician Assistant

## 2022-10-30 DIAGNOSIS — R195 Other fecal abnormalities: Secondary | ICD-10-CM | POA: Diagnosis not present

## 2022-10-30 DIAGNOSIS — R1030 Lower abdominal pain, unspecified: Secondary | ICD-10-CM

## 2022-10-30 DIAGNOSIS — K5730 Diverticulosis of large intestine without perforation or abscess without bleeding: Secondary | ICD-10-CM | POA: Diagnosis not present

## 2022-10-30 DIAGNOSIS — K8689 Other specified diseases of pancreas: Secondary | ICD-10-CM | POA: Diagnosis not present

## 2022-10-30 DIAGNOSIS — N9489 Other specified conditions associated with female genital organs and menstrual cycle: Secondary | ICD-10-CM

## 2022-10-30 DIAGNOSIS — R935 Abnormal findings on diagnostic imaging of other abdominal regions, including retroperitoneum: Secondary | ICD-10-CM

## 2022-10-30 MED ORDER — CIPROFLOXACIN HCL 500 MG PO TABS: 500.0000 mg | ORAL_TABLET | Freq: Two times a day (BID) | ORAL | 0 refills | Status: AC

## 2022-10-30 MED ORDER — METRONIDAZOLE 500 MG PO TABS: 500.0000 mg | ORAL_TABLET | Freq: Three times a day (TID) | ORAL | 0 refills | Status: AC

## 2022-10-30 MED ORDER — IOHEXOL 300 MG/ML  SOLN
100.0000 mL | Freq: Once | INTRAMUSCULAR | Status: AC | PRN
  Administered 2022-10-30: 60 mL via INTRAVENOUS

## 2022-11-01 ENCOUNTER — Encounter: Payer: Self-pay | Admitting: Physician Assistant

## 2022-11-03 ENCOUNTER — Ambulatory Visit (INDEPENDENT_AMBULATORY_CARE_PROVIDER_SITE_OTHER): Payer: Medicare HMO

## 2022-11-03 DIAGNOSIS — I442 Atrioventricular block, complete: Secondary | ICD-10-CM | POA: Diagnosis not present

## 2022-11-04 NOTE — Telephone Encounter (Signed)
This encounter was created in error - please disregard.

## 2022-11-06 ENCOUNTER — Other Ambulatory Visit: Payer: Self-pay | Admitting: Physician Assistant

## 2022-11-06 DIAGNOSIS — I10 Essential (primary) hypertension: Secondary | ICD-10-CM

## 2022-11-09 ENCOUNTER — Encounter: Payer: Self-pay | Admitting: Physician Assistant

## 2022-11-09 ENCOUNTER — Other Ambulatory Visit: Payer: Self-pay | Admitting: Physician Assistant

## 2022-11-09 DIAGNOSIS — M542 Cervicalgia: Secondary | ICD-10-CM

## 2022-11-09 MED ORDER — TRAMADOL HCL 50 MG PO TABS
50.0000 mg | ORAL_TABLET | Freq: Four times a day (QID) | ORAL | 2 refills | Status: DC | PRN
Start: 2022-11-09 — End: 2023-04-09

## 2022-11-10 ENCOUNTER — Encounter: Payer: Self-pay | Admitting: Physician Assistant

## 2022-11-10 ENCOUNTER — Encounter: Payer: Self-pay | Admitting: Nurse Practitioner

## 2022-11-10 ENCOUNTER — Ambulatory Visit (INDEPENDENT_AMBULATORY_CARE_PROVIDER_SITE_OTHER): Payer: Medicare HMO | Admitting: Physician Assistant

## 2022-11-10 VITALS — BP 134/78 | HR 80 | Temp 97.7°F | Wt 109.8 lb

## 2022-11-10 DIAGNOSIS — N9489 Other specified conditions associated with female genital organs and menstrual cycle: Secondary | ICD-10-CM | POA: Diagnosis not present

## 2022-11-10 DIAGNOSIS — K529 Noninfective gastroenteritis and colitis, unspecified: Secondary | ICD-10-CM | POA: Diagnosis not present

## 2022-11-10 DIAGNOSIS — R935 Abnormal findings on diagnostic imaging of other abdominal regions, including retroperitoneum: Secondary | ICD-10-CM

## 2022-11-10 NOTE — Patient Instructions (Signed)
Please call to schedule your pelvic ultrasound to further evaluate the masses noted on your CT scan.  I have sent an urgent referral to GI to reevaluate your colitis. Please keep well hydrated. Call if sudden worsening pain or symptoms. --Please call [facility] to schedule: [phone]

## 2022-11-10 NOTE — Progress Notes (Unsigned)
Subjective:    Patient ID: Bethany Carpenter, female    DOB: [birth year], 89 y.o.   MRN: [id number]  Chief Complaint  Patient presents with   Follow-up    HPI Patient is in today for recheck on colitis.  She took Metronidazole, but did not take ciprofloxacin due to nervous about side effects. Feels uncomfortable in her abdomen still, just kind of gassy and bloated.  No mucous, having solid stools, started getting back to normal about 4 days after taking metronidazole. No fever or chills. No blood in still. Appetite starting to get back to normal again.    Past Medical History:  Diagnosis Date   Allergy    Arthritis    Cancer (HCC)    Skin   Colon polyps    Heart disease    History of chickenpox    Hyperlipidemia    Hypertension    Presence of permanent cardiac pacemaker 01/12/2019    Past Surgical History:  Procedure Laterality Date   ANGIOPLASTY  03/08/2007   BREAST BIOPSY  09/28/2003   CATARACT EXTRACTION  11/25/2005   04/07/2006   CERVICAL SPINE SURGERY  11/24/2012   02/02/2008   INSERT / REPLACE / REMOVE PACEMAKER  01/12/2019   PACEMAKER IMPLANT N/A 01/12/2019   Procedure: PACEMAKER IMPLANT;  Surgeon: Hillis Range, MD;  Location: [facility];  Service: Cardiovascular;  Laterality: N/A;   TONSILLECTOMY  1952   TUBAL LIGATION  1970    Family History  Problem Relation Age of Onset   Hearing loss Mother    Hypertension Mother    Osteoporosis Mother    Arthritis Mother    Cancer Father        Skin   Hearing loss Father    Arthritis Father    Diabetes Brother    Stroke Brother    Early death Brother    Depression Daughter    Early death Son    Early death Maternal Grandfather    Early death Paternal Grandfather     Social History   Tobacco Use   Smoking status: Never   Smokeless tobacco: Never  Vaping Use   Vaping status: Never Used  Substance Use Topics   Alcohol use: No   Drug use: No     Allergies  Allergen Reactions   Other Hives and  Itching    Fragrances   Nickel    Penicillins Rash    Did it involve swelling of the face/tongue/throat, SOB, or low BP? No Did it involve sudden or severe rash/hives, skin peeling, or any reaction on the inside of your mouth or nose? Yes Did you need to seek medical attention at a hospital or doctor's office? Yes When did it last happen?      years ago  If all above answers are "NO", may proceed with cephalosporin use.    Sulfa Antibiotics Rash    Review of Systems NEGATIVE UNLESS OTHERWISE INDICATED IN HPI      Objective:     BP 134/78   Pulse 80   Temp 97.7 F (36.5 C) (Temporal)   Wt 109 lb 12.8 oz (49.8 kg)   SpO2 97%   BMI 20.75 kg/m   Wt Readings from Last 3 Encounters:  11/10/22 109 lb 12.8 oz (49.8 kg)  10/29/22 112 lb 6.4 oz (51 kg)  10/14/22 110 lb (49.9 kg)    BP Readings from Last 3 Encounters:  11/10/22 134/78  10/29/22 (!) 160/84  10/14/22 128/68  Physical Exam Vitals and nursing note reviewed.  Constitutional:      Appearance: Normal appearance.  Cardiovascular:     Rate and Rhythm: Normal rate and regular rhythm.     Pulses: Normal pulses.  Pulmonary:     Effort: Pulmonary effort is normal.     Breath sounds: Normal breath sounds.  Abdominal:     General: Abdomen is flat. Bowel sounds are normal.     Palpations: Abdomen is soft. There is no mass.     Tenderness: There is abdominal tenderness (diffuse lower abdomen). There is no right CVA tenderness, left CVA tenderness, guarding or rebound.     Hernia: No hernia is present.  Skin:    Findings: No rash.  Neurological:     Mental Status: She is alert.  Psychiatric:        Mood and Affect: Mood normal.        Assessment & Plan:  Colitis -     Ambulatory referral to Gastroenterology  Abnormal CT scan, pelvis  Adnexal mass   Patient reports overall feeling better since her last visit.  She completed treatment with metronidazole for her colitis, but refused to ciprofloxacin due  to potential side effects.  She cannot take penicillins as she is allergic to this.  Discussed that her colitis infection might not be fully covered with just the metronidazole.  She is going to try to manage at home with nutrition choices.  No red flag symptoms present on exam today.  ER precautions discussed with her.  Also discussed and going to send a referral to gastroenterology as she will need follow-up on this colitis.  I also reviewed her CT abdomen pelvis on 10/30/22 with her again.  There is concern for bilateral adnexal masses.  She needs to have an ultrasound pelvis completed.  She previously declined to this, but is now wanting to have it completed.  She has a phone number at home to contact and schedule.  I suspect this may be contributing to her lower abdominal discomfort.   Return if symptoms worsen or fail to improve.  This note was prepared with assistance of Conservation officer, historic buildings. Occasional wrong-word or sound-a-like substitutions may have occurred due to the inherent limitations of voice recognition software.    Willodean Leven M Eran Mistry, PA-C

## 2022-11-25 NOTE — Progress Notes (Signed)
Remote pacemaker transmission.   

## 2022-12-08 ENCOUNTER — Encounter: Payer: Self-pay | Admitting: Physician Assistant

## 2022-12-11 NOTE — Telephone Encounter (Signed)
Please see pt message

## 2022-12-13 NOTE — Telephone Encounter (Signed)
Noted  

## 2023-01-14 ENCOUNTER — Encounter: Payer: Self-pay | Admitting: Physician Assistant

## 2023-01-14 ENCOUNTER — Ambulatory Visit: Payer: Medicare HMO | Admitting: Physician Assistant

## 2023-01-14 ENCOUNTER — Ambulatory Visit (HOSPITAL_COMMUNITY)
Admission: RE | Admit: 2023-01-14 | Discharge: 2023-01-14 | Disposition: A | Discharge status: Home / Self Care | Payer: Medicare HMO | Source: Ambulatory Visit | Attending: Physician Assistant | Admitting: Physician Assistant

## 2023-01-14 VITALS — BP 160/74 | HR 114 | Temp 98.0°F | Ht 61.0 in | Wt 110.6 lb

## 2023-01-14 DIAGNOSIS — M542 Cervicalgia: Secondary | ICD-10-CM

## 2023-01-14 DIAGNOSIS — R935 Abnormal findings on diagnostic imaging of other abdominal regions, including retroperitoneum: Secondary | ICD-10-CM

## 2023-01-14 DIAGNOSIS — I10 Essential (primary) hypertension: Secondary | ICD-10-CM | POA: Diagnosis not present

## 2023-01-14 DIAGNOSIS — N9489 Other specified conditions associated with female genital organs and menstrual cycle: Secondary | ICD-10-CM | POA: Insufficient documentation

## 2023-01-14 DIAGNOSIS — G8929 Other chronic pain: Secondary | ICD-10-CM

## 2023-01-14 NOTE — Assessment & Plan Note (Signed)
Elevated today, WCS history. Runs 130s/70s at home.. Cont Lisinopril 20 mg; Coreg 12.5 mg BID.  Monitor at home.

## 2023-01-14 NOTE — Assessment & Plan Note (Signed)
Large masses identified on CT scan, potentially ovarian in origin. Left mass measures 8x5 cm and right mass measures 4x3 cm. No associated symptoms reported. -Order transvaginal ultrasound for further characterization of masses.

## 2023-01-14 NOTE — Assessment & Plan Note (Signed)
Stable on Tramadol 50 mg QID - has taken this for years. PDMP reviewed today, no red flags, filling appropriately.  Also takes Tylenol 500 mg daily and heating pad to help with pain.  Sleeps well, pain doesn't keep her awake.  Denies any dizziness or other side effects.

## 2023-01-14 NOTE — Patient Instructions (Addendum)
Repeat blood pressure Monitor BP readings at home  Continue Tramadol as directed  You have larger dense mass concerns on your prior CT scan in your pelvis. You need to have an Ultrasound done intravaginally. Someone will call you to schedule this.   Once your ultrasound is done, I will reach out to you with next steps.

## 2023-01-14 NOTE — Progress Notes (Signed)
Subjective:    Patient ID: Bethany Carpenter, female    DOB: 02-20-1934, 89 y.o.   MRN: [id number]  Chief Complaint  Patient presents with   Medical Management of Chronic Issues    Pt in office for f/u w/ PCP; also due for AWV and scheduled for patient; pt states she is doing well no longer any issues with stool; no concerns to discuss;     HPI Discussed the use of AI scribe software for clinical note transcription with the patient, who gave verbal consent to proceed.  History of Present Illness   The patient, with a history of hypertension and arthritis, presents for a follow-up visit. She reports a recent episode of gastrointestinal distress, which she suspects may have been due to food allergies. She has since made dietary changes, including eliminating soy, and has started taking a nightly probiotic (Pepteva), which has resulted in resolution of her symptoms. She denies current diarrhea or abdominal pain.  The patient also mentions a recent CT scan that revealed masses, suspected to be ovarian in origin. The patient was not aware of the size of these masses. She expresses some anxiety about the upcoming investigations and potential implications.  The patient's hypertension is managed with Lisinopril 10mg  twice daily and Coreg 12.5mg  twice daily. She also takes Tramadol for arthritis-related pain.      Past Medical History:  Diagnosis Date   Allergy    Arthritis    Cancer (HCC)    Skin   Colon polyps    Heart disease    History of chickenpox    Hyperlipidemia    Hypertension    Presence of permanent cardiac pacemaker 01/12/2019    Past Surgical History:  Procedure Laterality Date   ANGIOPLASTY  03/08/2007   BREAST BIOPSY  09/28/2003   CATARACT EXTRACTION  11/25/2005   04/07/2006   CERVICAL SPINE SURGERY  11/24/2012   02/02/2008   INSERT / REPLACE / REMOVE PACEMAKER  01/12/2019   PACEMAKER IMPLANT N/A 01/12/2019   Procedure: PACEMAKER IMPLANT;  Surgeon: Hillis Range, MD;   Location: [facility];  Service: Cardiovascular;  Laterality: N/A;   TONSILLECTOMY  1952   TUBAL LIGATION  1970    Family History  Problem Relation Age of Onset   Hearing loss Mother    Hypertension Mother    Osteoporosis Mother    Arthritis Mother    Cancer Father        Skin   Hearing loss Father    Arthritis Father    Diabetes Brother    Stroke Brother    Early death Brother    Depression Daughter    Early death Son    Early death Maternal Grandfather    Early death Paternal Grandfather     Social History   Tobacco Use   Smoking status: Never   Smokeless tobacco: Never  Vaping Use   Vaping status: Never Used  Substance Use Topics   Alcohol use: No   Drug use: No     Allergies  Allergen Reactions   Other Hives and Itching    Fragrances   Nickel    Penicillins Rash    Did it involve swelling of the face/tongue/throat, SOB, or low BP? No Did it involve sudden or severe rash/hives, skin peeling, or any reaction on the inside of your mouth or nose? Yes Did you need to seek medical attention at a hospital or doctor's office? Yes When did it last happen?  years ago  If all above answers are "NO", may proceed with cephalosporin use.    Sulfa Antibiotics Rash    Review of Systems NEGATIVE UNLESS OTHERWISE INDICATED IN HPI      Objective:     BP (!) 160/74 (BP Location: Left Arm, Patient Position: Sitting)   Pulse (!) 114   Temp 98 F (36.7 C) (Temporal)   Ht 5\' 1"  (1.549 m)   Wt 110 lb 9.6 oz (50.2 kg)   SpO2 97%   BMI 20.90 kg/m   Wt Readings from Last 3 Encounters:  01/14/23 110 lb 9.6 oz (50.2 kg)  11/10/22 109 lb 12.8 oz (49.8 kg)  10/29/22 112 lb 6.4 oz (51 kg)    BP Readings from Last 3 Encounters:  01/14/23 (!) 160/74  11/10/22 134/78  10/29/22 (!) 160/84     Physical Exam Vitals and nursing note reviewed.  Constitutional:      General: She is not in acute distress.    Appearance: Normal appearance. She is not  ill-appearing.  HENT:     Head: Normocephalic.  Neck:     Vascular: No carotid bruit.  Cardiovascular:     Rate and Rhythm: Normal rate and regular rhythm.     Comments: pacemaker Pulmonary:     Effort: Pulmonary effort is normal.     Breath sounds: Normal breath sounds.  Musculoskeletal:     Left shoulder: Decreased range of motion (external rotation limited).     Cervical back: Rigidity (chronic pain and spasm) present.  Lymphadenopathy:     Cervical: No cervical adenopathy.  Skin:    General: Skin is warm.  Neurological:     Mental Status: She is alert.  Psychiatric:        Mood and Affect: Mood normal.        Behavior: Behavior normal.        Assessment & Plan:  Chronic neck pain Assessment & Plan: Stable on Tramadol 50 mg QID - has taken this for years. PDMP reviewed today, no red flags, filling appropriately.  Also takes Tylenol 500 mg daily and heating pad to help with pain.  Sleeps well, pain doesn't keep her awake.  Denies any dizziness or other side effects.    Essential hypertension Assessment & Plan: Elevated today, WCS history. Runs 130s/70s at home.. Cont Lisinopril 20 mg; Coreg 12.5 mg BID.  Monitor at home.    Abnormal CT scan, pelvis -     US PELVIC COMPLETE WITH TRANSVAGINAL; Future  Adnexal mass Assessment & Plan: Large masses identified on CT scan, potentially ovarian in origin. Left mass measures 8x5 cm and right mass measures 4x3 cm. No associated symptoms reported. -Order transvaginal ultrasound for further characterization of masses.  Orders: -     US PELVIC COMPLETE WITH TRANSVAGINAL; Future     General Health Maintenance Upcoming bone density scan and gastroenterology appointment. -Encourage patient to keep appointments for ongoing health monitoring.        Return in about 3 months (around 04/16/2023) for recheck/follow-up.   Azeem Poorman M Jasper Ruminski, PA-C

## 2023-01-18 ENCOUNTER — Other Ambulatory Visit: Payer: Self-pay

## 2023-01-18 DIAGNOSIS — R935 Abnormal findings on diagnostic imaging of other abdominal regions, including retroperitoneum: Secondary | ICD-10-CM

## 2023-01-25 ENCOUNTER — Encounter: Payer: Self-pay | Admitting: Obstetrics and Gynecology

## 2023-01-25 ENCOUNTER — Ambulatory Visit (INDEPENDENT_AMBULATORY_CARE_PROVIDER_SITE_OTHER): Payer: Medicare HMO | Admitting: Obstetrics and Gynecology

## 2023-01-25 VITALS — BP 122/64 | HR 73 | Ht 61.0 in | Wt 112.0 lb

## 2023-01-25 DIAGNOSIS — K529 Noninfective gastroenteritis and colitis, unspecified: Secondary | ICD-10-CM | POA: Diagnosis not present

## 2023-01-25 DIAGNOSIS — N83201 Unspecified ovarian cyst, right side: Secondary | ICD-10-CM | POA: Diagnosis not present

## 2023-01-25 DIAGNOSIS — H1013 Acute atopic conjunctivitis, bilateral: Secondary | ICD-10-CM | POA: Diagnosis not present

## 2023-01-25 DIAGNOSIS — N83202 Unspecified ovarian cyst, left side: Secondary | ICD-10-CM

## 2023-01-25 DIAGNOSIS — D3131 Benign neoplasm of right choroid: Secondary | ICD-10-CM | POA: Diagnosis not present

## 2023-01-25 DIAGNOSIS — I5022 Chronic systolic (congestive) heart failure: Secondary | ICD-10-CM

## 2023-01-25 NOTE — Progress Notes (Signed)
[facility]   89 y.o. y.o. female here for annual exam. She denies any PM bleeding. As tenderness with palpation over her left ovary but denies any pain otherwise. No CP or SOB.  Does not have a cardiologist. NO family history of GYN cancers. Has irregular bowel movement. No obvious increased abdominal girth Seeing GI soon.  No LMP recorded. Patient is postmenopausal.   Patient with mucus in stool had CT scan with incidental bilateral ovarian cysts seen on CT in July. IMPRESSION: There is no intestinal obstruction or pneumoperitoneum. There is no hydronephrosis.   There is mild diffuse wall thickening in the caudal course of descending colon at the level of iliac crest with possible minimal pericolic stranding. This may be suggestive of mild colitis or artifact due to incomplete distention.   There is 7.9 x 5 cm smooth marginated fluid density structure in the left side of pelvis. There is 4.2 x 2.9 cm fluid density structure in the right side of pelvis. Possibility of adnexal masses is not excluded. Follow-up pelvic sonogram may be considered.   Heart is enlarged in size. Aortic arteriosclerosis. Lumbar spondylosis.     Electronically Signed   By: Ernie Avena M.D.   On: 10/30/2022 14:03   TV US in Oct showed US PELVIC COMPLETE WITH TRANSVAGINAL (Accession [id number]) (Order [id number]) Imaging Date: 01/14/2023 Department: [facility] Released By: Ander Gaster Authorizing: Allwardt, Crist Infante, PA-C   Exam Status  Status  Final [99]   PACS Intelerad Image Link   Show images for US PELVIC COMPLETE WITH TRANSVAGINAL Study Result  Narrative & Impression  CLINICAL DATA:  89 year old with adnexal masses on CT.   EXAM: TRANSABDOMINAL AND TRANSVAGINAL ULTRASOUND OF PELVIS   TECHNIQUE: Both transabdominal and transvaginal ultrasound examinations of the pelvis were performed. Transabdominal technique was performed for global imaging  of the pelvis including uterus, ovaries, adnexal regions, and pelvic cul-de-sac. It was necessary to proceed with endovaginal exam following the transabdominal exam to visualize the ovaries and adnexa.   COMPARISON:  CT 10/30/2022   FINDINGS: Uterus   Measurements: 6.4 x 2.8 x 4.5 cm = volume: 42 mL. Anteverted. No fibroids or other mass visualized.   Endometrium   Thickness: 3 mm.  There is trace fluid in the endocervical canal.   Right ovary   Cystic lesion in the right adnexa measuring 3.7 x 2.7 x 2.7 cm. This appears simple without internal nodularity. This may be ovarian in etiology, possible but not definite thin rim of ovarian tissue.   Left ovary   Cystic lesion in the left adnexa measuring 7.7 x 4.9 x 6.2 cm. This appears to be simple without internal nodularity. This may be ovarian in etiology, but no definite surrounding ovarian tissue is seen.   Other findings   No abnormal free fluid.   IMPRESSION: 1. Simple appearing cystic structures in both adnexa, 7.7 cm on the left and 3.7 cm on the right. These may be ovarian in etiology, however ovarian tissue is difficult to delineate. Alternatively these may represent pelvic peritoneal cysts. In a postmenopausal patient for cyst of this size, this is concerning for low grade cystic neoplasm. Recommend gynecology consult with either follow-up ultrasound in 3-6 months or MRI with IV contrast for improved characterization. 2. Trace fluid in the endometrial canal which is abnormal in a patient of this age. This is nonspecific. Recommend correlation for history of vaginal bleeding. There is no endometrial thickening.     Electronically Signed  By: Narda Rutherford M.D.   On: 01/14/2023 15:53      Blood pressure 122/64, pulse 73, height 5\' 1"  (1.549 m), weight 112 lb (50.8 kg), SpO2 99%.  No results found for: "DIAGPAP", "HPVHIGH", "ADEQPAP"  GYN HISTORY: No results found for: "DIAGPAP", "HPVHIGH",  "ADEQPAP" Denies any abnormal pap smears  OB History  Gravida Para Term Preterm AB Living  2 2 2     2   SAB IAB Ectopic Multiple Live Births          2    # Outcome Date GA Lbr Len/2nd Weight Sex Type Anes PTL Lv  2 Term      Vag-Spont   LIV  1 Term      Vag-Spont   LIV    Past Medical History:  Diagnosis Date   Allergy    Arthritis    Cancer (HCC)    Skin   Colon polyps    Heart disease    History of chickenpox    Hyperlipidemia    Hypertension    Presence of permanent cardiac pacemaker 01/12/2019    Past Surgical History:  Procedure Laterality Date   ANGIOPLASTY  03/08/2007   BREAST BIOPSY  09/28/2003   CATARACT EXTRACTION  11/25/2005   04/07/2006   CERVICAL SPINE SURGERY  11/24/2012   02/02/2008   INSERT / REPLACE / REMOVE PACEMAKER  01/12/2019   PACEMAKER IMPLANT N/A 01/12/2019   Procedure: PACEMAKER IMPLANT;  Surgeon: Hillis Range, MD;  Location: [facility];  Service: Cardiovascular;  Laterality: N/A;   TONSILLECTOMY  1952   TUBAL LIGATION  1970    Current Outpatient Medications on File Prior to Visit  Medication Sig Dispense Refill   Acetaminophen (ARTHRITIS PAIN RELIEF PO) Take 500 mg by mouth.     Calcium-Magnesium-Vitamin D (CITRACAL CALCIUM+D) 600-40-500 MG-MG-UNIT TB24 Take 2 tablets by mouth daily. 60 tablet 0   carvedilol (COREG) 12.5 MG tablet TAKE 1 TABLET BY MOUTH TWICE A DAY WITH FOOD 180 tablet 1   fluticasone (FLONASE) 50 MCG/ACT nasal spray Place 1 spray into both nostrils daily.     ketotifen (ZADITOR) 0.035 % ophthalmic solution 1 drop in the morning and at bedtime.     lisinopril (ZESTRIL) 10 MG tablet TAKE 1 TABLET BY MOUTH TWICE A DAY 180 tablet 1   simvastatin (ZOCOR) 40 MG tablet TAKE 1 TABLET BY MOUTH EVERY DAY 90 tablet 1   traMADol (ULTRAM) 50 MG tablet Take 1 tablet (50 mg total) by mouth every 6 (six) hours as needed for moderate pain. 120 tablet 2   No current facility-administered medications on file prior to visit.     Social History   Socioeconomic History   Marital status: Divorced    Spouse name: Not on file   Number of children: Not on file   Years of education: Not on file   Highest education level: 12th grade  Occupational History   Occupation: Retired  Tobacco Use   Smoking status: Never   Smokeless tobacco: Never  Vaping Use   Vaping status: Never Used  Substance and Sexual Activity   Alcohol use: No   Drug use: No   Sexual activity: Not Currently    Birth control/protection: Post-menopausal    Comment: less than 5 sexual partner intercourse at age 54  Other Topics Concern   Not on file  Social History Narrative   Not on file   Social Determinants of Health   Financial Resource Strain: Low Risk  (  01/23/2022)   Overall Financial Resource Strain (CARDIA)    Difficulty of Paying Living Expenses: Not hard at all  Food Insecurity: No Food Insecurity (07/15/2022)   Hunger Vital Sign    Worried About Running Out of Food in the Last Year: Never true    Ran Out of Food in the Last Year: Never true  Transportation Needs: No Transportation Needs (07/15/2022)   PRAPARE - Administrator, Civil Service (Medical): No    Lack of Transportation (Non-Medical): No  Physical Activity: Unknown (07/15/2022)   Exercise Vital Sign    Days of Exercise per Week: Patient declined    Minutes of Exercise per Session: Not on file  Stress: Patient Declined (07/15/2022)   [employer] of Occupational Health - Occupational Stress Questionnaire    Feeling of Stress : Patient declined  Social Connections: Socially Isolated (07/15/2022)   Social Connection and Isolation Panel [NHANES]    Frequency of Communication with Friends and Family: More than three times a week    Frequency of Social Gatherings with Friends and Family: Patient declined    Attends Religious Services: Never    Database administrator or Organizations: No    Attends Engineer, structural: Not on file    Marital  Status: Divorced  Intimate Partner Violence: Not At Risk (01/23/2022)   Humiliation, Afraid, Rape, and Kick questionnaire    Fear of Current or Ex-Partner: No    Emotionally Abused: No    Physically Abused: No    Sexually Abused: No    Family History  Problem Relation Age of Onset   Hearing loss Mother    Hypertension Mother    Osteoporosis Mother    Arthritis Mother    Cancer Father        Skin   Hearing loss Father    Arthritis Father    Diabetes Brother    Stroke Brother    Early death Brother    Early death Maternal Grandfather    Early death Paternal Grandfather    Depression Daughter    Early death Son    Breast cancer Neg Hx    Ovarian cancer Neg Hx      Allergies  Allergen Reactions   Other Hives and Itching    Fragrances   Nickel    Penicillins Rash    Did it involve swelling of the face/tongue/throat, SOB, or low BP? No Did it involve sudden or severe rash/hives, skin peeling, or any reaction on the inside of your mouth or nose? Yes Did you need to seek medical attention at a hospital or doctor's office? Yes When did it last happen?      years ago  If all above answers are "NO", may proceed with cephalosporin use.    Sulfa Antibiotics Rash      Patient's last menstrual period was No LMP recorded. Patient is postmenopausal..            Review of Systems Alls systems reviewed and are negative.     Physical Exam Constitutional:      Appearance: Normal appearance.  Genitourinary:     Vulva and urethral meatus normal.     No lesions in the vagina.     Right Labia: No rash, lesions or skin changes.    Left Labia: No lesions, skin changes or rash.    No vaginal discharge or tenderness.     No vaginal prolapse present.    No vaginal atrophy present.  Right Adnexa: palpable and mass present.    Right Adnexa: not tender.    Left Adnexa: tender, palpable and mass present.    No cervical motion tenderness or discharge.     Uterus is not  enlarged, tender or irregular.     Uterus is anteverted.  Breasts:    Right: Normal.     Left: Normal.  HENT:     Head: Normocephalic.  Neck:     Thyroid: No thyroid mass, thyromegaly or thyroid tenderness.  Cardiovascular:     Heart sounds: S1 normal and S2 normal.  Pulmonary:     Breath sounds: Normal air entry.  Abdominal:     General: There is no distension.     Palpations: Abdomen is soft. There is no mass.     Tenderness: There is no abdominal tenderness. There is no guarding or rebound.  Musculoskeletal:        General: Normal range of motion.     Cervical back: Full passive range of motion without pain. No tenderness.     Right lower leg: No edema.     Left lower leg: No edema.  Neurological:     Mental Status: She is alert.  Skin:    General: Skin is warm.  Psychiatric:        Mood and Affect: Mood normal.        Behavior: Behavior normal.        Thought Content: Thought content normal.  Vitals and nursing note reviewed. Exam conducted with a chaperone present.       A:         Consultation for bilateral ovarian cysts in menopause from Alyssa Allwardt, PA                             P:        Referral for cardiology placed To get OVA 1 today. RTC in 2 weeks to discuss results. Counseled on s/s of torsion and when to return.  May need MRI as well at that time.  Likely benign serous cystadenoma but will need pathology to determine that.  Patient should work on cardiac and medical clearance, in the event surgery is indicated ie cysts are growing in size and nature and or with worsening pain. Encouraged patient to complete the GI consult due to colitis on CT scan and diverticulitis.  No follow-ups on file.  Earley Favor

## 2023-01-26 ENCOUNTER — Other Ambulatory Visit (INDEPENDENT_AMBULATORY_CARE_PROVIDER_SITE_OTHER): Payer: Medicare HMO

## 2023-01-26 ENCOUNTER — Ambulatory Visit: Payer: Medicare HMO | Admitting: Nurse Practitioner

## 2023-01-26 ENCOUNTER — Encounter: Payer: Self-pay | Admitting: Nurse Practitioner

## 2023-01-26 VITALS — BP 130/78 | HR 74 | Ht 61.0 in | Wt 111.1 lb

## 2023-01-26 DIAGNOSIS — R1032 Left lower quadrant pain: Secondary | ICD-10-CM | POA: Diagnosis not present

## 2023-01-26 DIAGNOSIS — D508 Other iron deficiency anemias: Secondary | ICD-10-CM

## 2023-01-26 DIAGNOSIS — D649 Anemia, unspecified: Secondary | ICD-10-CM

## 2023-01-26 DIAGNOSIS — R935 Abnormal findings on diagnostic imaging of other abdominal regions, including retroperitoneum: Secondary | ICD-10-CM | POA: Diagnosis not present

## 2023-01-26 DIAGNOSIS — Z860100 Personal history of colon polyps, unspecified: Secondary | ICD-10-CM | POA: Diagnosis not present

## 2023-01-26 LAB — CBC
HCT: 36.5 % (ref 36.0–46.0)
Hemoglobin: 12 g/dL (ref 12.0–15.0)
MCHC: 32.9 g/dL (ref 30.0–36.0)
MCV: 89.7 fL (ref 78.0–100.0)
Platelets: 249 10*3/uL (ref 150.0–400.0)
RBC: 4.07 Mil/uL (ref 3.87–5.11)
RDW: 14 % (ref 11.5–15.5)
WBC: 4.9 10*3/uL (ref 4.0–10.5)

## 2023-01-26 LAB — IBC + FERRITIN
Ferritin: 77.3 ng/mL (ref 10.0–291.0)
Iron: 64 ug/dL (ref 42–145)
Saturation Ratios: 16.5 % — ABNORMAL LOW (ref 20.0–50.0)
TIBC: 387.8 ug/dL (ref 250.0–450.0)
Transferrin: 277 mg/dL (ref 212.0–360.0)

## 2023-01-26 LAB — BASIC METABOLIC PANEL
BUN: 18 mg/dL (ref 6–23)
CO2: 29 meq/L (ref 19–32)
Calcium: 10 mg/dL (ref 8.4–10.5)
Chloride: 102 meq/L (ref 96–112)
Creatinine, Ser: 0.79 mg/dL (ref 0.40–1.20)
GFR: 67.5 mL/min (ref >60.00)
Glucose, Bld: 100 mg/dL — ABNORMAL HIGH (ref 70–99)
Potassium: 4.3 meq/L (ref 3.5–5.1)
Sodium: 140 meq/L (ref 135–145)

## 2023-01-26 LAB — B12 AND FOLATE PANEL
Folate: 16.5 ng/mL (ref >5.9)
Vitamin B-12: 276 pg/mL (ref 211–911)

## 2023-01-26 NOTE — Patient Instructions (Signed)
_______________________________________________________  If your blood pressure at your visit was 140/90 or greater, please contact your primary care physician to follow up on this.  _______________________________________________________  If you are age 87 or older, your body mass index should be between 23-30. Your Body mass index is 21 kg/m. If this is out of the aforementioned range listed, please consider follow up with your Primary Care Provider.  If you are age 87 or younger, your body mass index should be between 19-25. Your Body mass index is 21 kg/m. If this is out of the aformentioned range listed, please consider follow up with your Primary Care Provider.   ________________________________________________________  The [facility] providers would like to encourage you to use [facility] to communicate with providers for non-urgent requests or questions.  Due to long hold times on the telephone, sending your provider a message by [facility] may be a faster and more efficient way to get a response.  Please allow 48 business hours for a response.  Please remember that this is for non-urgent requests.  _______________________________________________________  Your provider has requested that you go to the basement level for lab work before leaving today. Press "B" on the elevator. The lab is located at the first door on the left as you exit the elevator.  You have been scheduled for a CT scan of the abdomen and pelvis at [facility], 1st floor Radiology. You are scheduled on 01-29-23 at 4pm. You should arrive at 145pm for registration and to drink the contrast prior to the exam.    1) Do not eat anything after 12pm (4 hours prior to your test)   The purpose of you drinking the oral contrast is to aid in the visualization of your intestinal tract. The contrast solution may cause some diarrhea. Depending on your individual set of symptoms, you may also receive an intravenous injection of  x-ray contrast/dye. Plan on being at [facility] for 45 minutes or longer, depending on the type of exam you are having performed.   If you have any questions regarding your exam or if you need to reschedule, you may call [facility] at [phone] between the hours of 8:00 am and 5:00 pm, Monday-Friday.   Due to recent changes in healthcare laws, you may see the results of your imaging and laboratory studies on MyChart before your provider has had a chance to review them.  We understand that in some cases there may be results that are confusing or concerning to you. Not all laboratory results come back in the same time frame and the provider may be waiting for multiple results in order to interpret others.  Please give [place] 48 hours in order for your provider to thoroughly review all the results before contacting the office for clarification of your results.   Thank you for entrusting me with your care and choosing [facility].  Alcide Evener, NP

## 2023-01-26 NOTE — Progress Notes (Signed)
01/26/2023 Skeet Latch [id number] Oct 12, 1933   CHIEF COMPLAINT: Colitis follow up  HISTORY OF PRESENT ILLNESS: Bethany Carpenter is an 89 year old female with a past medical history of arthritis, hypertension, hyperlipidemia, complete heart block s/p pacemaker 01/2019, specified heart disease, CHF, colon polyps. She presents to our office today as referred by Alyssa Allwardt PA-C for follow up regarding possible colitis. She endorsed passing a large amount of nonbloody mucous per the rectum x 1 episode without diarrhea or constipation but noted having generalized abdominal cramping x 1 week. She was seen by Charlett Blake, PA-C 10/29/2022 further evaluation and on exam the patient had diffuse lower abdominal tenderness. She was sent to [facility] ED and a CTAP with contrast showed mild diffuse wall thickening in he descending colon just above mild colitis versus artifact due to incomplete distention and a 7.9 x 5 cm smooth fluid density structure in the left side of the pelvis and a 4.2 x 2.9 cm fluid density structure in the right side of the pelvis concerning for adnexal masses.  Labs showed a WBC count of 4.7.  Hemoglobin 11.1.  Hematocrit 34.2.  MCV 90.2.  Platelet 264.  BUN 18.  Creatinine 0.72.  Normal LFTs.  Prescribed a course of Metronidazole and Ciprofloxacin for suspected colitis.  However, the patient stated she took only Metronidazole as Cipro had too many potential side effects.  Her abdominal cramping abated and she denied passing any further mucus per the rectum.  Since then, she has been passing normal brown formed bowel movement most days.  Regarding the bilateral adnexal masses, she underwent a vaginal pelvic ultrasound 01/14/2023 which identified a simple appearing cystic structures in both adnexa, 7.7 cm on the left and 3.7 cm on the right. She was seen by gyn yesterday anlaboratory studies including ovarian malignancy panel were collected, results pending.  She  is taking a  probiotic nightly.  Her appetite is good.  No fever, night sweats or weight loss.  She underwent a colonoscopy by GI in [place] 01/19/2007 which was normal.  She presented with her past colonoscopy records I reviewed.  She underwent a colonoscopy 11/02/2000 showed 1 polyp removed from the colon, path report not available but a handwritten note on this document noted a benign polyp.  No known family history of IBD or colorectal cancer.     Latest Ref Rng & Units 10/29/2022    1:30 PM 07/15/2022   12:01 PM 01/14/2021   10:31 AM  CBC  WBC 4.0 - 10.5 K/uL 4.7  4.3  4.2   Hemoglobin 12.0 - 15.0 g/dL 72.5  36.6  44.0   Hematocrit 36.0 - 46.0 % 34.2  35.8  36.8   Platelets 150.0 - 400.0 K/uL 264.0  264.0  196.0        Latest Ref Rng & Units 10/29/2022    1:30 PM 07/15/2022   12:01 PM 01/14/2021   10:31 AM  CMP  Glucose 70 - 99 mg/dL 347  71  79   BUN 6 - 23 mg/dL 18  19  18    Creatinine 0.40 - 1.20 mg/dL 4.25  9.56  3.87   Sodium 135 - 145 mEq/L 137  138  139   Potassium 3.5 - 5.1 mEq/L 4.0  4.3  4.6   Chloride 96 - 112 mEq/L 101  102  103   CO2 19 - 32 mEq/L 31  30  33   Calcium 8.4 - 10.5 mg/dL 9.3  9.7  9.8   Total Protein 6.0 - 8.3 g/dL 6.9  6.9  7.0   Total Bilirubin 0.2 - 1.2 mg/dL 0.3  0.4  0.4   Alkaline Phos 39 - 117 U/L 68  61  52   AST 0 - 37 U/L 21  29  32   ALT 0 - 35 U/L 10  18  21      CTAP with contrast 10/29/2022: FINDINGS: Lower chest: Heart is enlarged in size. Pacer leads are noted in place. Visualized lower lung fields are clear.   Hepatobiliary: No focal abnormalities are seen in liver. Gallbladder is unremarkable. There is no dilation of bile ducts.   Pancreas: There is slight prominence of pancreatic duct. No focal abnormalities are seen.   Spleen: Unremarkable.   Adrenals/Urinary Tract: Adrenals are unremarkable. There is no hydronephrosis. There are no renal or ureteral stones. Urinary bladder is unremarkable.   Stomach/Bowel: Stomach is  unremarkable. Proximal small bowel loops are not dilated. There is mild dilation of distal small bowel loops with fluid in the lumen. Appendix is not seen. There is no pericecal inflammation. Large amount of stool is seen in right colon. There is mild diffuse wall thickening in distal course of descending colon. There is possible minimal pericolic stranding adjacent to the distal descending colon. Multiple diverticula seen in colon.   Vascular/Lymphatic: Arterial calcifications are seen.   Reproductive: There is 7.9 x 5 cm fluid density structure in the left side of pelvis. There are no internal septations or thick wall. There is 4.2 x 2.9 cm fluid density structure in the right side of pelvis.   Other: There is no ascites or pneumoperitoneum.   Musculoskeletal: Degenerative changes are noted in lumbar spine. There is anterolisthesis at L3-L4 level. There is encroachment of neural foramina at multiple levels.   IMPRESSION: There is no intestinal obstruction or pneumoperitoneum. There is no hydronephrosis.   There is mild diffuse wall thickening in the caudal course of descending colon at the level of iliac crest with possible minimal pericolic stranding. This may be suggestive of mild colitis or artifact due to incomplete distention.   There is 7.9 x 5 cm smooth marginated fluid density structure in the left side of pelvis. There is 4.2 x 2.9 cm fluid density structure in the right side of pelvis. Possibility of adnexal masses is not excluded. Follow-up pelvic sonogram may be considered.   Heart is enlarged in size. Aortic arteriosclerosis. Lumbar spondylosis.  Pelvic sonogram 01/14/2023: 1. Simple appearing cystic structures in both adnexa, 7.7 cm on the left and 3.7 cm on the right. These may be ovarian in etiology, however ovarian tissue is difficult to delineate. Alternatively these may represent pelvic peritoneal cysts. In a postmenopausal patient for cyst of this  size, this is concerning for low grade cystic neoplasm. Recommend gynecology consult with either follow-up ultrasound in 3-6 months or MRI with IV contrast for improved characterization. 2. Trace fluid in the endometrial canal which is abnormal in a patient of this age. This is nonspecific. Recommend correlation for history of vaginal bleeding. There is no endometrial thickening.    Past Medical History:  Diagnosis Date   Allergy    Arthritis    Cancer [facility])    Skin   Colon polyps    Heart disease    History of chickenpox    Hyperlipidemia    Hypertension    Presence of permanent cardiac pacemaker 01/12/2019   Past Surgical History:  Procedure Laterality Date   ANGIOPLASTY  03/08/2007   BREAST BIOPSY  09/28/2003   CATARACT EXTRACTION  11/25/2005   04/07/2006   CERVICAL SPINE SURGERY  11/24/2012   02/02/2008   INSERT / REPLACE / REMOVE PACEMAKER  01/12/2019   PACEMAKER IMPLANT N/A 01/12/2019   Procedure: PACEMAKER IMPLANT;  Surgeon: Hillis Range, MD;  Location: [facility] INVASIVE CV LAB;  Service: Cardiovascular;  Laterality: N/A;   TONSILLECTOMY  1952   TUBAL LIGATION  1970   Social History: She is divorced.  She has 1 son and 1 daughter.  Retired Research scientist (medical).  Non-smoker.  No alcohol use.  No drug use.  Family History: family history includes Arthritis in her father and mother; Cancer in her father; Depression in her daughter; Diabetes in her brother; Early death in her brother, maternal grandfather, paternal grandfather, and son; Hearing loss in her father and mother; Hypertension in her mother; Osteoporosis in her mother; Stroke in her brother.  No known family history of esophageal, gastric or colorectal cancer.   Allergies  Allergen Reactions   Other Hives and Itching    Fragrances   Nickel    Penicillins Rash    Did it involve swelling of the face/tongue/throat, SOB, or low BP? No Did it involve sudden or severe rash/hives, skin peeling, or any reaction on the  inside of your mouth or nose? Yes Did you need to seek medical attention at a hospital or doctor's office? Yes When did it last happen?      years ago  If all above answers are "NO", may proceed with cephalosporin use.    Sulfa Antibiotics Rash      Outpatient Encounter Medications as of 01/26/2023  Medication Sig   Acetaminophen (ARTHRITIS PAIN RELIEF PO) Take 500 mg by mouth.   Calcium-Magnesium-Vitamin D (CITRACAL CALCIUM+D) 600-40-500 MG-MG-UNIT TB24 Take 2 tablets by mouth daily.   carvedilol (COREG) 12.5 MG tablet TAKE 1 TABLET BY MOUTH TWICE A DAY WITH FOOD   fluticasone (FLONASE) 50 MCG/ACT nasal spray Place 1 spray into both nostrils daily.   ketotifen (ZADITOR) 0.035 % ophthalmic solution 1 drop in the morning and at bedtime.   lisinopril (ZESTRIL) 10 MG tablet TAKE 1 TABLET BY MOUTH TWICE A DAY   simvastatin (ZOCOR) 40 MG tablet TAKE 1 TABLET BY MOUTH EVERY DAY   traMADol (ULTRAM) 50 MG tablet Take 1 tablet (50 mg total) by mouth every 6 (six) hours as needed for moderate pain.   No facility-administered encounter medications on file as of 01/26/2023.   REVIEW OF SYSTEMS:  Gen: Denies fever, sweats or chills. No weight loss.  CV: Denies chest pain, palpitations or edema. Resp: Denies cough, shortness of breath of hemoptysis.  GI: Denies heartburn, dysphagia, stomach or lower abdominal pain. No diarrhea or constipation.  GU: + Urine leakage.  MS: + Arthritis and back pain.  Derm: Denies rash, itchiness, skin lesions or unhealing ulcers. Psych: Denies depression, anxiety, memory loss or confusion. Heme: Denies bruising, easy bleeding. Neuro:  Denies headaches, dizziness or paresthesias. Endo:  Denies any problems with DM, thyroid or adrenal function.  PHYSICAL EXAM: BP 130/78 (BP Location: Left Arm, Patient Position: Sitting, Cuff Size: Normal)   Pulse 74   Ht 5\' 1"  (1.549 m)   Wt 111 lb 2 oz (50.4 kg)   SpO2 99%   BMI 21.00 kg/m  General: in no acute  distress. Head: Normocephalic and atraumatic. Eyes:  Sclerae non-icteric, conjunctive pink. Ears: Normal auditory acuity. Mouth: Dentition intact. No ulcers or lesions.  Neck: Supple,  no lymphadenopathy or thyromegaly.  Lungs: Clear bilaterally to auscultation without wheezes, crackles or rhonchi. Heart: Regular rate and rhythm. No murmur, rub or gallop appreciated.  Abdomen: Soft, nondistended.  Mild left mid to lower quadrant tenderness without rebound or guarding.  No masses. No hepatosplenomegaly. Normoactive bowel sounds x 4 quadrants.  No bruit. Rectal: Deferred. Musculoskeletal: Symmetrical with no gross deformities. Skin: Warm and dry. No rash or lesions on visible extremities. Extremities: No edema. Neurological: Alert oriented x 4, no focal deficits.  Psychological:  Alert and cooperative. Normal mood and affect.  ASSESSMENT AND PLAN:  89 year old female with suspected colitis, passed a large amount of nonbloody mucous per the rectum x 1 with lower abdominal cramping x 1 week. CTAP with contrast 10/29/2022 showed mild diffuse wall thickening in he descending colon just above mild colitis versus artifact due to incomplete distention.  Patient completed a course of Flagyl without recurrence of mucous per the rectum and her lower abdominal cramping abated.  She is passing normal bowel movements. She has mild left mid to LLQ tenderness on exam today without rebound or guarding.  Normal colonoscopy in 2008. -CTAP with contrast to rule out distant diffuse wall thickening to the descending colon as seen on prior CT 10/29/2022. -BMP, results to be reviewed prior to the patient receiving IV contrast -Diet as tolerated -Patient to contact our office if lower abdominal pain recurs  CTAP 04/30/2022 identified a 7.9 x 5 cm smooth fluid density structure in the left side of the pelvis and a 4.2 x 2.9 cm fluid density structure in the right side of the pelvis concerning for adnexal masses.  Transvaginal pelvic ultrasound 01/14/2023 which identified a simple appearing cystic structures in both adnexa, 7.7 cm on the left and 3.7 cm on the right. She was seen by gyn yesterday anlaboratory studies including ovarian malignancy panel were collected, results pending.   -Continue follow-up with GYN  History of a colon polyp per colonoscopy in 2002.  Normal colonoscopy in 2008.  Mild normocytic anemia.  Hemoglobin 11.1. -CBC, IBC plus ferritin,  B12 and folate    CC:  Allwardt, Crist Infante, PA-C

## 2023-01-27 ENCOUNTER — Encounter: Payer: Self-pay | Admitting: Physician Assistant

## 2023-01-27 NOTE — Telephone Encounter (Signed)
Please see pt msg and advise any orders/prescriptions needed.

## 2023-01-29 ENCOUNTER — Other Ambulatory Visit: Payer: Self-pay | Admitting: Physician Assistant

## 2023-01-29 ENCOUNTER — Ambulatory Visit (HOSPITAL_COMMUNITY)
Admission: RE | Admit: 2023-01-29 | Discharge: 2023-01-29 | Disposition: A | Discharge status: Home / Self Care | Payer: Medicare HMO | Source: Ambulatory Visit | Attending: Nurse Practitioner | Admitting: Nurse Practitioner

## 2023-01-29 DIAGNOSIS — R935 Abnormal findings on diagnostic imaging of other abdominal regions, including retroperitoneum: Secondary | ICD-10-CM | POA: Diagnosis not present

## 2023-01-29 DIAGNOSIS — N83201 Unspecified ovarian cyst, right side: Secondary | ICD-10-CM | POA: Diagnosis not present

## 2023-01-29 DIAGNOSIS — N838 Other noninflammatory disorders of ovary, fallopian tube and broad ligament: Secondary | ICD-10-CM | POA: Diagnosis not present

## 2023-01-29 DIAGNOSIS — K5730 Diverticulosis of large intestine without perforation or abscess without bleeding: Secondary | ICD-10-CM | POA: Diagnosis not present

## 2023-01-29 DIAGNOSIS — D508 Other iron deficiency anemias: Secondary | ICD-10-CM | POA: Diagnosis not present

## 2023-01-29 DIAGNOSIS — N83202 Unspecified ovarian cyst, left side: Secondary | ICD-10-CM | POA: Diagnosis not present

## 2023-01-29 DIAGNOSIS — R1032 Left lower quadrant pain: Secondary | ICD-10-CM | POA: Insufficient documentation

## 2023-01-29 MED ORDER — IOHEXOL 300 MG/ML  SOLN
100.0000 mL | Freq: Once | INTRAMUSCULAR | Status: AC | PRN
  Administered 2023-01-29: 100 mL via INTRAVENOUS

## 2023-01-29 MED ORDER — B-12 1000 MCG PO CAPS: 1.0000 | ORAL_CAPSULE | Freq: Every day | ORAL | 1 refills | Status: AC

## 2023-01-29 MED ORDER — IOHEXOL 9 MG/ML PO SOLN
500.0000 mL | ORAL | Status: AC
  Administered 2023-01-29 (×2): 500 mL via ORAL

## 2023-01-29 MED ORDER — IOHEXOL 9 MG/ML PO SOLN
ORAL | Status: AC
Start: 2023-01-29 — End: ?
  Filled 2023-01-29: qty 1000

## 2023-02-02 ENCOUNTER — Ambulatory Visit (INDEPENDENT_AMBULATORY_CARE_PROVIDER_SITE_OTHER): Payer: Medicare HMO

## 2023-02-02 VITALS — BP 130/70 | HR 84 | Temp 98.7°F | Wt 111.2 lb

## 2023-02-02 DIAGNOSIS — I442 Atrioventricular block, complete: Secondary | ICD-10-CM | POA: Diagnosis not present

## 2023-02-02 DIAGNOSIS — Z0000 Encounter for general adult medical examination without abnormal findings: Secondary | ICD-10-CM | POA: Diagnosis not present

## 2023-02-02 NOTE — Progress Notes (Signed)
Subjective:   Bethany Carpenter is a 89 y.o. female who presents for Medicare Annual (Subsequent) preventive examination.  Visit Complete: In person Pt completed 01/30/23 questionnaire   Cardiac Risk Factors include: advanced age (>36men, >63 women);hypertension;dyslipidemia     Objective:    Today's Vitals   02/02/23 1326  BP: 130/70  Pulse: 84  Temp: 98.7 F (37.1 C)  SpO2: 94%  Weight: 111 lb 3.2 oz (50.4 kg)   Body mass index is 21.01 kg/m.     02/02/2023    1:37 PM 01/23/2022   11:44 AM 12/28/2020   10:11 AM 03/08/2019    3:26 AM 01/13/2019    8:03 AM 01/12/2019   11:13 AM  Advanced Directives  Does Patient Have a Medical Advance Directive? Yes Yes No No  No  Type of Estate agent of [place];Living will Healthcare Power of [place];Living will      Does patient want to make changes to medical advance directive? No - Patient declined       Copy of Healthcare Power of Attorney in Chart? Yes - validated most recent copy scanned in chart (See row information) No - copy requested      Would patient like information on creating a medical advance directive?   Yes (ED - Information included in AVS) No - Patient declined No - Patient declined Yes (ED - send information to MyChart)    Current Medications (verified) Outpatient Encounter Medications as of 02/02/2023  Medication Sig   Acetaminophen (ARTHRITIS PAIN RELIEF PO) Take 500 mg by mouth.   Calcium-Magnesium-Vitamin D (CITRACAL CALCIUM+D) 600-40-500 MG-MG-UNIT TB24 Take 2 tablets by mouth daily.   carvedilol (COREG) 12.5 MG tablet TAKE 1 TABLET BY MOUTH TWICE A DAY WITH FOOD   Cyanocobalamin (B-12) 1000 MCG CAPS Take 1 capsule by mouth daily.   fluticasone (FLONASE) 50 MCG/ACT nasal spray Place 1 spray into both nostrils daily.   FLUZONE HIGH-DOSE 0.5 ML injection    ketotifen (ZADITOR) 0.035 % ophthalmic solution 1 drop in the morning and at bedtime.   lisinopril (ZESTRIL) 10 MG tablet TAKE 1 TABLET  BY MOUTH TWICE A DAY   simvastatin (ZOCOR) 40 MG tablet TAKE 1 TABLET BY MOUTH EVERY DAY   SPIKEVAX syringe    traMADol (ULTRAM) 50 MG tablet Take 1 tablet (50 mg total) by mouth every 6 (six) hours as needed for moderate pain.   No facility-administered encounter medications on file as of 02/02/2023.    Allergies (verified) Other, Nickel, Penicillins, and Sulfa antibiotics   History: Past Medical History:  Diagnosis Date   Allergy    Arthritis    Cancer (HCC)    Skin   Colon polyps    Heart disease    History of chickenpox    Hyperlipidemia    Hypertension    Presence of permanent cardiac pacemaker 01/12/2019   Past Surgical History:  Procedure Laterality Date   ANGIOPLASTY  03/08/2007   BREAST BIOPSY  09/28/2003   CATARACT EXTRACTION  11/25/2005   04/07/2006   CERVICAL SPINE SURGERY  11/24/2012   02/02/2008   INSERT / REPLACE / REMOVE PACEMAKER  01/12/2019   PACEMAKER IMPLANT N/A 01/12/2019   Procedure: PACEMAKER IMPLANT;  Surgeon: Hillis Range, MD;  Location: [facility];  Service: Cardiovascular;  Laterality: N/A;   TONSILLECTOMY  1952   TUBAL LIGATION  1970   Family History  Problem Relation Age of Onset   Hearing loss Mother    Hypertension Mother  Osteoporosis Mother    Arthritis Mother    Cancer Father        Skin   Hearing loss Father    Arthritis Father    Diabetes Brother    Stroke Brother    Early death Brother    Early death Maternal Grandfather    Early death Paternal Grandfather    Depression Daughter    Early death Son    Breast cancer Neg Hx    Ovarian cancer Neg Hx    Social History   Socioeconomic History   Marital status: Divorced    Spouse name: Not on file   Number of children: Not on file   Years of education: Not on file   Highest education level: 12th grade  Occupational History   Occupation: Retired  Tobacco Use   Smoking status: Never   Smokeless tobacco: Never  Vaping Use   Vaping status: Never Used   Substance and Sexual Activity   Alcohol use: No   Drug use: No   Sexual activity: Not Currently    Birth control/protection: Post-menopausal    Comment: less than 5 sexual partner intercourse at age 33  Other Topics Concern   Not on file  Social History Narrative   Not on file   Social Determinants of Health   Financial Resource Strain: Low Risk  (01/30/2023)   Overall Financial Resource Strain (CARDIA)    Difficulty of Paying Living Expenses: Not hard at all  Food Insecurity: No Food Insecurity (01/30/2023)   Hunger Vital Sign    Worried About Running Out of Food in the Last Year: Never true    Ran Out of Food in the Last Year: Never true  Transportation Needs: No Transportation Needs (01/30/2023)   PRAPARE - Administrator, Civil Service (Medical): No    Lack of Transportation (Non-Medical): No  Physical Activity: Unknown (01/30/2023)   Exercise Vital Sign    Days of Exercise per Week: 0 days    Minutes of Exercise per Session: Not on file  Stress: No Stress Concern Present (01/30/2023)   [employer] of Occupational Health - Occupational Stress Questionnaire    Feeling of Stress : Not at all  Social Connections: Unknown (01/30/2023)   Social Connection and Isolation Panel [NHANES]    Frequency of Communication with Friends and Family: Once a week    Frequency of Social Gatherings with Friends and Family: Patient declined    Attends Religious Services: Never    Database administrator or Organizations: No    Attends Engineer, structural: Never    Marital Status: Divorced    Tobacco Counseling Counseling given: Not Answered   Clinical Intake:  Pre-visit preparation completed: Yes  Pain : No/denies pain     BMI - recorded: 21.01 Nutritional Status: BMI of 19-24  Normal Diabetes: No  How often do you need to have someone help you when you read instructions, pamphlets, or other written materials from your doctor or pharmacy?: 1 -  Never  Interpreter Needed?: No  Information entered by :: Lanier Ensign, LPN   Activities of Daily Living    01/30/2023   12:38 PM  In your present state of health, do you have any difficulty performing the following activities:  Hearing? 1  Comment hearing AIDS  Vision? 0  Difficulty concentrating or making decisions? 0  Walking or climbing stairs? 0  Dressing or bathing? 0  Doing errands, shopping? 0  Preparing Food and eating ?  N  Using the Toilet? N  In the past six months, have you accidently leaked urine? Y  Comment pt wears liners  Do you have problems with loss of bowel control? N  Managing your Medications? N  Managing your Finances? N  Housekeeping or managing your Housekeeping? N    Patient Care Team: Allwardt, Crist Infante, PA-C as PCP - General (Physician Assistant) Dahlia Byes, [facility] (Inactive) as Pharmacist (Pharmacist) Earley Favor, MD as Consulting Physician (Obstetrics and Gynecology)  Indicate any recent Medical Services you may have received from other than [facility] providers in the past year (date may be approximate).     Assessment:   This is a routine wellness examination for [facility].  Hearing/Vision screen Hearing Screening - Comments:: Pt has hearing aids  Vision Screening - Comments:: Pt follows up with [facility]    Goals Addressed             This Visit's Progress    Patient Stated       Maintain health and activity to care for self        Depression Screen    02/02/2023    1:36 PM 10/29/2022   12:54 PM 10/14/2022   11:30 AM 01/23/2022   11:42 AM 12/28/2020   10:03 AM 08/29/2020    7:58 AM 11/07/2019   10:01 AM  PHQ 2/9 Scores  PHQ - 2 Score 0 0 0 1 0 0 0  PHQ- 9 Score  0 0   0     Fall Risk    01/30/2023   12:38 PM 10/29/2022   12:54 PM 10/14/2022   11:30 AM 01/23/2022   11:45 AM 12/28/2020   10:02 AM  Fall Risk   Falls in the past year? 0 0 0 0 0  Number falls in past yr:  0 0 0 0  Injury with Fall?  0 0 0 0  Risk for  fall due to : No Fall Risks No Fall Risks No Fall Risks Impaired vision No Fall Risks  Follow up Falls prevention discussed  Falls evaluation completed Falls prevention discussed     MEDICARE RISK AT HOME: Medicare Risk at Home Any stairs in or around the home?: Yes If so, are there any without handrails?: Yes Home free of loose throw rugs in walkways, pet beds, electrical cords, etc?: No Adequate lighting in your home to reduce risk of falls?: Yes Life alert?: No Use of a cane, walker or w/c?: No Grab bars in the bathroom?: Yes Shower chair or bench in shower?: No Elevated toilet seat or a handicapped toilet?: No  TIMED UP AND GO:  Was the test performed?  Yes  Length of time to ambulate 10 feet: 10 sec Gait steady and fast without use of assistive device    Cognitive Function:        02/02/2023    1:38 PM 01/23/2022   11:47 AM  6CIT Screen  What Year? 0 points 0 points  What month? 0 points 0 points  What time? 0 points 0 points  Count back from 20 0 points 0 points  Months in reverse 0 points 0 points  Repeat phrase 0 points 0 points  Total Score 0 points 0 points    Immunizations Immunization History  Administered Date(s) Administered   Fluad Quad(high Dose 65+) 12/06/2018   Influenza, High Dose Seasonal PF 01/03/2018, 12/31/2021, 12/17/2022   Influenza,inj,Quad PF,6+ Mos 01/06/2016, 01/04/2017   Influenza,inj,quad, With Preservative 01/06/2016   Influenza-Unspecified 01/06/2016,  01/04/2017, 01/05/2017, 01/03/2018, 01/02/2020, 01/10/2021   Moderna Covid-19 Fall Seasonal Vaccine 87yrs & older 01/16/2022, 12/31/2022   Moderna Covid-19 Vaccine Bivalent Booster 66yrs & up 12/27/2020, 01/16/2022   Moderna SARS-COV2 Booster Vaccination 07/16/2020, 12/27/2020   Moderna Sars-Covid-2 Vaccination 05/17/2019, 06/07/2019, 02/15/2020   Pneumococcal Conjugate-13 05/15/2015   Pneumococcal Polysaccharide-23 04/06/2001   Tdap 04/06/2012, 04/06/2012    TDAP status: Up to  date  Flu Vaccine status: Up to date  Pneumococcal vaccine status: Up to date  Covid-19 vaccine status: Completed vaccines  Qualifies for Shingles Vaccine? Yes   Zostavax completed No   Shingrix Completed?: No.    Education has been provided regarding the importance of this vaccine. Patient has been advised to call insurance company to determine out of pocket expense if they have not yet received this vaccine. Advised may also receive vaccine at local pharmacy or Health Dept. Verbalized acceptance and understanding.  Screening Tests Health Maintenance  Topic Date Due   Zoster Vaccines- Shingrix (1 of 2) Never done   DTaP/Tdap/Td (3 - Td or Tdap) 01/13/2024 (Originally 04/06/2022)   Medicare Annual Wellness (AWV)  02/02/2024   Pneumonia Vaccine 79+ Years old  Completed   INFLUENZA VACCINE  Completed   DEXA SCAN  Completed   COVID-19 Vaccine  Completed   HPV VACCINES  Aged Out    Health Maintenance  Health Maintenance Due  Topic Date Due   Zoster Vaccines- Shingrix (1 of 2) Never done    Colorectal cancer screening: No longer required.   Mammogram status: No longer required due to age .  Scheduled bone density  for 02/05/23   Additional Screening:   Vision Screening: Recommended annual ophthalmology exams for early detection of glaucoma and other disorders of the eye. Is the patient up to date with their annual eye exam?  Yes  Who is the provider or what is the name of the office in which the patient attends annual eye exams? [facility]  If pt is not established with a provider, would they like to be referred to a provider to establish care? No .   Dental Screening: Recommended annual dental exams for proper oral hygiene  Community Resource Referral / Chronic Care Management: CRR required this visit?  No   CCM required this visit?  No     Plan:     I have personally reviewed and noted the following in the patient's chart:   Medical and social history Use of  alcohol, tobacco or illicit drugs  Current medications and supplements including opioid prescriptions. Patient is currently taking opioid prescriptions. Information provided to patient regarding non-opioid alternatives. Patient advised to discuss non-opioid treatment plan with their provider. Functional ability and status Nutritional status Physical activity Advanced directives List of other physicians Hospitalizations, surgeries, and ER visits in previous 12 months Vitals Screenings to include cognitive, depression, and falls Referrals and appointments  In addition, I have reviewed and discussed with patient certain preventive protocols, quality metrics, and best practice recommendations. A written personalized care plan for preventive services as well as general preventive health recommendations were provided to patient.     Marzella Schlein, LPN   [id number]   After Visit Summary: (MyChart) Due to this being a telephonic visit, the after visit summary with patients personalized plan was offered to patient via MyChart   Nurse Notes: none

## 2023-02-02 NOTE — Patient Instructions (Signed)
Bethany Carpenter , Thank you for taking time to come for your Medicare Wellness Visit. I appreciate your ongoing commitment to your health goals. Please review the following plan we discussed and let me know if I can assist you in the future.   Referrals/Orders/Follow-Ups/Clinician Recommendations: maintain health and activity to care for self  Aim for 30 minutes of exercise or brisk walking, 6-8 glasses of water, and 5 servings of fruits and vegetables each day.   This is a list of the screening recommended for you and due dates:  Health Maintenance  Topic Date Due   Zoster (Shingles) Vaccine (1 of 2) Never done   Medicare Annual Wellness Visit  01/24/2023   DTaP/Tdap/Td vaccine (3 - Td or Tdap) 01/13/2024*   Pneumonia Vaccine  Completed   Flu Shot  Completed   DEXA scan (bone density measurement)  Completed   COVID-19 Vaccine  Completed   HPV Vaccine  Aged Out  *Topic was postponed. The date shown is not the original due date.    Advanced directives: (In Chart) A copy of your advanced directives are scanned into your chart should your provider ever need it.  Next Medicare Annual Wellness Visit scheduled for next year: Yes

## 2023-02-03 LAB — CUP PACEART REMOTE DEVICE CHECK
Battery Remaining Longevity: 87 mo
Battery Voltage: 2.98 V
Brady Statistic AP VP Percent: 12.98 %
Brady Statistic AP VS Percent: 0 %
Brady Statistic AS VP Percent: 86.99 %
Brady Statistic AS VS Percent: 0.03 %
Brady Statistic RA Percent Paced: 12.96 %
Brady Statistic RV Percent Paced: 99.97 %
Date Time Interrogation Session: 20241029020558
Implantable Lead Connection Status: 753985
Implantable Lead Connection Status: 753985
Implantable Lead Implant Date: 20201008
Implantable Lead Implant Date: 20201008
Implantable Lead Location: 753859
Implantable Lead Location: 753860
Implantable Lead Model: 5076
Implantable Lead Model: 5076
Implantable Pulse Generator Implant Date: 20201008
Lead Channel Impedance Value: 247 Ohm
Lead Channel Impedance Value: 304 Ohm
Lead Channel Impedance Value: 323 Ohm
Lead Channel Impedance Value: 399 Ohm
Lead Channel Pacing Threshold Amplitude: 0.625 V
Lead Channel Pacing Threshold Amplitude: 0.875 V
Lead Channel Pacing Threshold Pulse Width: 0.4 ms
Lead Channel Pacing Threshold Pulse Width: 0.4 ms
Lead Channel Sensing Intrinsic Amplitude: 1.5 mV
Lead Channel Sensing Intrinsic Amplitude: 1.5 mV
Lead Channel Sensing Intrinsic Amplitude: 6.875 mV
Lead Channel Sensing Intrinsic Amplitude: 6.875 mV
Lead Channel Setting Pacing Amplitude: 1.5 V
Lead Channel Setting Pacing Amplitude: 2.5 V
Lead Channel Setting Pacing Pulse Width: 0.4 ms
Lead Channel Setting Sensing Sensitivity: 5.6 mV
Zone Setting Status: 755011
Zone Setting Status: 755011

## 2023-02-05 ENCOUNTER — Ambulatory Visit
Admission: RE | Admit: 2023-02-05 | Discharge: 2023-02-05 | Disposition: A | Discharge status: Home / Self Care | Payer: Medicare HMO | Source: Ambulatory Visit | Attending: Physician Assistant | Admitting: Physician Assistant

## 2023-02-05 DIAGNOSIS — E2839 Other primary ovarian failure: Secondary | ICD-10-CM | POA: Diagnosis not present

## 2023-02-05 DIAGNOSIS — N958 Other specified menopausal and perimenopausal disorders: Secondary | ICD-10-CM | POA: Diagnosis not present

## 2023-02-05 DIAGNOSIS — M8588 Other specified disorders of bone density and structure, other site: Secondary | ICD-10-CM | POA: Diagnosis not present

## 2023-02-05 DIAGNOSIS — M818 Other osteoporosis without current pathological fracture: Secondary | ICD-10-CM

## 2023-02-05 LAB — OVARIAN MALIGNANCY RISK-ROMA
CA125: 15 U/mL (ref <35)
HE4, OVARIAN CANCER MONITORING: 70 pmol/L
ROMA Postmenopausal: 1.54 (ref <2.77)
ROMA Premenopausal: 1.51 — ABNORMAL HIGH (ref <1.31)

## 2023-02-08 ENCOUNTER — Ambulatory Visit: Payer: Medicare HMO | Admitting: Obstetrics and Gynecology

## 2023-02-23 NOTE — Progress Notes (Signed)
Remote pacemaker transmission.   

## 2023-02-25 ENCOUNTER — Ambulatory Visit: Payer: Medicare HMO | Admitting: Obstetrics and Gynecology

## 2023-02-25 ENCOUNTER — Encounter: Payer: Self-pay | Admitting: Obstetrics and Gynecology

## 2023-02-25 VITALS — BP 120/70 | HR 72

## 2023-02-25 DIAGNOSIS — N83202 Unspecified ovarian cyst, left side: Secondary | ICD-10-CM

## 2023-02-25 DIAGNOSIS — N83201 Unspecified ovarian cyst, right side: Secondary | ICD-10-CM | POA: Diagnosis not present

## 2023-02-25 NOTE — Patient Instructions (Addendum)
The vaginal ultrasound order has been placed, all you need to do is call the scheduling desk at [phone] to make the appointment at one of our outpatient imaging centers at your earliest convenience. This is to evaluate your uterus and ovaries.  The report will go directly to epic after.  PLEASE also call the office with the date you have the ultrasound scheduled, so we can schedule a follow-up to discuss results with you.  Dr. Margretta Ditty schedule 6 weeks from last [place] to evaluate for any changes.  Return with any severe pain in the pelvis immediately.

## 2023-02-25 NOTE — Progress Notes (Signed)
Bethany Carpenter   89 y.o. y.o. female here to discuss results.   She denies any PM bleeding. As tenderness with palpation over her left ovary but denies any pain otherwise. No CP or SOB.  Does not have a cardiologist.  She received a call for referral, but did not have the number to return the call back to establish care.  NO family history of GYN cancers. Has irregular bowel movement. No obvious increased abdominal girth Seeing GI soon.  Today, she denies any pelvic pain. Roma with elevated premenstrual but not postmenopausal  0 Result Notes    Component Ref Range & Units 1 mo ago  ROMA Premenopausal <1.31 1.51 High   ROMA Postmenopausal <2.77 1.54  CA125 <35 U/mL 15  HE4, OVARIAN CANCER MONITORING pmol/L 70  Comment: . Female Reference Ranges for HE4, Ovarian Cancer     No LMP recorded. Patient is postmenopausal.   Patient with mucus in stool had CT scan with incidental bilateral ovarian cysts seen on CT in July. IMPRESSION: There is no intestinal obstruction or pneumoperitoneum. There is no hydronephrosis.   There is mild diffuse wall thickening in the caudal course of descending colon at the level of iliac crest with possible minimal pericolic stranding. This may be suggestive of mild colitis or artifact due to incomplete distention.   There is 7.9 x 5 cm smooth marginated fluid density structure in the left side of pelvis. There is 4.2 x 2.9 cm fluid density structure in the right side of pelvis. Possibility of adnexal masses is not excluded. Follow-up pelvic sonogram may be considered.   Heart is enlarged in size. Aortic arteriosclerosis. Lumbar spondylosis.     Electronically Signed   By: Ernie Avena M.D.   On: 10/30/2022 14:03   TV US in Oct showed US PELVIC COMPLETE WITH TRANSVAGINAL (Accession [id number]) (Order [id number]) Imaging Date: 01/14/2023 Department: [facility] Released By: Ander Gaster Authorizing:  Allwardt, Crist Infante, PA-C   Exam Status  Status  Final [99]   PACS Intelerad Image Link   Show images for US PELVIC COMPLETE WITH TRANSVAGINAL Study Result  Narrative & Impression  CLINICAL DATA:  89 year old with adnexal masses on CT.   EXAM: TRANSABDOMINAL AND TRANSVAGINAL ULTRASOUND OF PELVIS   TECHNIQUE: Both transabdominal and transvaginal ultrasound examinations of the pelvis were performed. Transabdominal technique was performed for global imaging of the pelvis including uterus, ovaries, adnexal regions, and pelvic cul-de-sac. It was necessary to proceed with endovaginal exam following the transabdominal exam to visualize the ovaries and adnexa.   COMPARISON:  CT 10/30/2022   FINDINGS: Uterus   Measurements: 6.4 x 2.8 x 4.5 cm = volume: 42 mL. Anteverted. No fibroids or other mass visualized.   Endometrium   Thickness: 3 mm.  There is trace fluid in the endocervical canal.   Right ovary   Cystic lesion in the right adnexa measuring 3.7 x 2.7 x 2.7 cm. This appears simple without internal nodularity. This may be ovarian in etiology, possible but not definite thin rim of ovarian tissue.   Left ovary   Cystic lesion in the left adnexa measuring 7.7 x 4.9 x 6.2 cm. This appears to be simple without internal nodularity. This may be ovarian in etiology, but no definite surrounding ovarian tissue is seen.   Other findings   No abnormal free fluid.   IMPRESSION: 1. Simple appearing cystic structures in both adnexa, 7.7 cm on the left and 3.7 cm on the right. These may be ovarian  in etiology, however ovarian tissue is difficult to delineate. Alternatively these may represent pelvic peritoneal cysts. In a postmenopausal patient for cyst of this size, this is concerning for low grade cystic neoplasm. Recommend gynecology consult with either follow-up ultrasound in 3-6 months or MRI with IV contrast for improved characterization. 2. Trace fluid in the  endometrial canal which is abnormal in a patient of this age. This is nonspecific. Recommend correlation for history of vaginal bleeding. There is no endometrial thickening.     Electronically Signed   By: Narda Rutherford M.D.   On: 01/14/2023 15:53      Blood pressure 120/70, pulse 72, SpO2 97%.  No results found for: "DIAGPAP", "HPVHIGH", "ADEQPAP"  GYN HISTORY: No results found for: "DIAGPAP", "HPVHIGH", "ADEQPAP" Denies any abnormal pap smears  OB History  Gravida Para Term Preterm AB Living  2 2 2     2   SAB IAB Ectopic Multiple Live Births          2    # Outcome Date GA Lbr Len/2nd Weight Sex Type Anes PTL Lv  2 Term      Vag-Spont   LIV  1 Term      Vag-Spont   LIV    Past Medical History:  Diagnosis Date   Allergy    Arthritis    Cancer (HCC)    Skin   Colon polyps    Heart disease    History of chickenpox    Hyperlipidemia    Hypertension    Presence of permanent cardiac pacemaker 01/12/2019    Past Surgical History:  Procedure Laterality Date   ANGIOPLASTY  03/08/2007   BREAST BIOPSY  09/28/2003   CATARACT EXTRACTION  11/25/2005   04/07/2006   CERVICAL SPINE SURGERY  11/24/2012   02/02/2008   INSERT / REPLACE / REMOVE PACEMAKER  01/12/2019   PACEMAKER IMPLANT N/A 01/12/2019   Procedure: PACEMAKER IMPLANT;  Surgeon: Hillis Range, MD;  Location: [facility];  Service: Cardiovascular;  Laterality: N/A;   TONSILLECTOMY  1952   TUBAL LIGATION  1970    Current Outpatient Medications on File Prior to Visit  Medication Sig Dispense Refill   Acetaminophen (ARTHRITIS PAIN RELIEF PO) Take 500 mg by mouth.     Calcium-Magnesium-Vitamin D (CITRACAL CALCIUM+D) 600-40-500 MG-MG-UNIT TB24 Take 2 tablets by mouth daily. 60 tablet 0   carvedilol (COREG) 12.5 MG tablet TAKE 1 TABLET BY MOUTH TWICE A DAY WITH FOOD 180 tablet 1   fluticasone (FLONASE) 50 MCG/ACT nasal spray Place 1 spray into both nostrils daily.     ketotifen (ZADITOR) 0.035 %  ophthalmic solution 1 drop in the morning and at bedtime.     lisinopril (ZESTRIL) 10 MG tablet TAKE 1 TABLET BY MOUTH TWICE A DAY 180 tablet 1   simvastatin (ZOCOR) 40 MG tablet TAKE 1 TABLET BY MOUTH EVERY DAY 90 tablet 1   traMADol (ULTRAM) 50 MG tablet Take 1 tablet (50 mg total) by mouth every 6 (six) hours as needed for moderate pain. 120 tablet 2   Cyanocobalamin (B-12) 1000 MCG CAPS Take 1 capsule by mouth daily. (Patient not taking: Reported on 02/25/2023) 90 capsule 1   No current facility-administered medications on file prior to visit.    Social History   Socioeconomic History   Marital status: Divorced    Spouse name: Not on file   Number of children: Not on file   Years of education: Not on file   Highest education level: 12th  grade  Occupational History   Occupation: Retired  Tobacco Use   Smoking status: Never   Smokeless tobacco: Never  Vaping Use   Vaping status: Never Used  Substance and Sexual Activity   Alcohol use: No   Drug use: No   Sexual activity: Not Currently    Birth control/protection: Post-menopausal    Comment: less than 5 sexual partner intercourse at age 76  Other Topics Concern   Not on file  Social History Narrative   Not on file   Social Determinants of Health   Financial Resource Strain: Low Risk  (01/30/2023)   Overall Financial Resource Strain (CARDIA)    Difficulty of Paying Living Expenses: Not hard at all  Food Insecurity: No Food Insecurity (01/30/2023)   Hunger Vital Sign    Worried About Running Out of Food in the Last Year: Never true    Ran Out of Food in the Last Year: Never true  Transportation Needs: No Transportation Needs (01/30/2023)   PRAPARE - Administrator, Civil Service (Medical): No    Lack of Transportation (Non-Medical): No  Physical Activity: Unknown (01/30/2023)   Exercise Vital Sign    Days of Exercise per Week: 0 days    Minutes of Exercise per Session: Not on file  Stress: No Stress  Concern Present (01/30/2023)   [employer] of Occupational Health - Occupational Stress Questionnaire    Feeling of Stress : Not at all  Social Connections: Unknown (01/30/2023)   Social Connection and Isolation Panel [NHANES]    Frequency of Communication with Friends and Family: Once a week    Frequency of Social Gatherings with Friends and Family: Patient declined    Attends Religious Services: Never    Database administrator or Organizations: No    Attends Banker Meetings: Never    Marital Status: Divorced  Catering manager Violence: Not At Risk (02/02/2023)   Humiliation, Afraid, Rape, and Kick questionnaire    Fear of Current or Ex-Partner: No    Emotionally Abused: No    Physically Abused: No    Sexually Abused: No    Family History  Problem Relation Age of Onset   Hearing loss Mother    Hypertension Mother    Osteoporosis Mother    Arthritis Mother    Cancer Father        Skin   Hearing loss Father    Arthritis Father    Diabetes Brother    Stroke Brother    Early death Brother    Early death Maternal Grandfather    Early death Paternal Grandfather    Depression Daughter    Early death Son    Breast cancer Neg Hx    Ovarian cancer Neg Hx      Allergies  Allergen Reactions   Other Hives and Itching    Fragrances   Nickel    Penicillins Rash    Did it involve swelling of the face/tongue/throat, SOB, or low BP? No Did it involve sudden or severe rash/hives, skin peeling, or any reaction on the inside of your mouth or nose? Yes Did you need to seek medical attention at a hospital or doctor's office? Yes When did it last happen?      years ago  If all above answers are "NO", may proceed with cephalosporin use.    Sulfa Antibiotics Rash      Patient's last menstrual period was No LMP recorded. Patient is postmenopausal..  Review of Systems Alls systems reviewed and are negative.     Physical Exam Constitutional:       Appearance: Normal appearance.  Genitourinary:     Vulva and urethral meatus normal.     No lesions in the vagina.     Right Labia: No rash, lesions or skin changes.    Left Labia: No lesions, skin changes or rash.    No vaginal discharge or tenderness.     No vaginal prolapse present.    No vaginal atrophy present.     Right Adnexa: palpable and mass present.    Right Adnexa: not tender.    Left Adnexa: tender, palpable and mass present.    No cervical motion tenderness or discharge.     Uterus is not enlarged, tender or irregular.     Uterus is anteverted.  Breasts:    Right: Normal.     Left: Normal.  HENT:     Head: Normocephalic.  Neck:     Thyroid: No thyroid mass, thyromegaly or thyroid tenderness.  Cardiovascular:     Heart sounds: S1 normal and S2 normal.  Pulmonary:     Breath sounds: Normal air entry.  Abdominal:     General: There is no distension.     Palpations: Abdomen is soft. There is no mass.     Tenderness: There is no abdominal tenderness. There is no guarding or rebound.  Musculoskeletal:        General: Normal range of motion.     Cervical back: Full passive range of motion without pain. No tenderness.     Right lower leg: No edema.     Left lower leg: No edema.  Neurological:     Mental Status: She is alert.  Skin:    General: Skin is warm.  Psychiatric:        Mood and Affect: Mood normal.        Behavior: Behavior normal.        Thought Content: Thought content normal.  Vitals and nursing note reviewed. Exam conducted with a chaperone present.      A:        Patient with bilateral ovarian cysts in menopause from Alyssa Allwardt, PA presents for ROMA results                             P:       Postmenopausal score in normal range.  However, discussed to have consult with Gyn oncology to review findings help guide management.  Patient to return immediately with any torsion s/s and she agreed. Repeat US ordered.  Discussed having this  completed 6 weeks from the last [place] to see if there are any changes. She agreed.  20 minutes spent on reviewing records, imaging,  and one on one patient time and counseling patient and documentation Dr. Karma Greaser  No follow-ups on file.  Earley Favor

## 2023-02-26 ENCOUNTER — Telehealth: Payer: Self-pay

## 2023-02-26 NOTE — Telephone Encounter (Signed)
-----   Message from Earley Favor sent at 02/25/2023 12:06 PM EST ----- Can you do me a favor and call her and write a letter with the cardiologist that wanted to do her consult and the number to gynoncology.for both referrals  She is hard of hearing and missed cardiology call Thank you Dr. Karma Greaser

## 2023-02-26 NOTE — Telephone Encounter (Signed)
Left detailed VM on machine per DPR advising pt of phone numbers to outgoing referral offices for cardiology and GYN oncology but advised on VM will also send mychart msg with this information.   Mychart msg sent and will also send staff msg to staff that attempted contact w/ pt from cardiology office regarding pt's situation per Dr. Karma Greaser.   A staff msg was sent to the employee of the cardio office that initially contacted the pt to schedule her appt and also documented that she sent pt an "expungement" letter per referral note. Here is the msg:  "Good morning. I am reaching out from [facility] with Dr. Bonney Roussel office whom referred this pt to your office.   The pt was just seen in our office yesterday and was informed that she has missed calls from your office to set up appt from referral.   Unfortunately, the pt is hard of hearing but shows desire to be compliant with the referral. The pt has been contacted by [place] by phone and by mychart message and was provided with your office number to call back to schedule the appointment.   Please let us know if there is anything else that we can do to assist you and the pt with this to ensure she gets an appt made. Thank you!"

## 2023-03-12 ENCOUNTER — Other Ambulatory Visit: Payer: Self-pay | Admitting: Physician Assistant

## 2023-03-12 DIAGNOSIS — E785 Hyperlipidemia, unspecified: Secondary | ICD-10-CM

## 2023-03-15 ENCOUNTER — Encounter: Payer: Self-pay | Admitting: Obstetrics and Gynecology

## 2023-03-18 NOTE — Telephone Encounter (Signed)
Spoke w/ Kia @ [facility] and requested if they could re-open her referral and attempt more to contact the pt for her first visit from referral.   She obliged with re-opening the cardio referral and they will make some more attempts to contact the pt.   Looks like the oncology referral has been closed and the notes say   "Denied due to provider to provider talk."

## 2023-03-19 NOTE — Telephone Encounter (Signed)
Per EB:  "Yes, and to follow with repeat US in the 4-6 weeks from last one. Thank you Dr. Karma Greaser"  Per [place] appt notes:  "Nov 25 & Dec 3-LM 6 wk follow up from Oct 26"

## 2023-03-22 ENCOUNTER — Encounter: Payer: Self-pay | Admitting: Obstetrics and Gynecology

## 2023-03-23 NOTE — Telephone Encounter (Signed)
Per cardio referral notes: "03/19/23: Called 3x, LVM - CRM"  Is there anything more that you would like for [place] to do in attempt to contact the pt prior to closing the encounter?

## 2023-04-08 ENCOUNTER — Telehealth: Payer: Self-pay | Admitting: Obstetrics and Gynecology

## 2023-04-08 NOTE — Telephone Encounter (Signed)
 Dr Karma Greaser placed ultrasound order for patient to schedule. Left message on November 25 & December 3 and mailed letter on December 9 for patient to call and schedule  appointment; patient has not scheduled.

## 2023-04-09 ENCOUNTER — Other Ambulatory Visit: Payer: Self-pay | Admitting: Physician Assistant

## 2023-04-09 ENCOUNTER — Other Ambulatory Visit: Payer: Self-pay

## 2023-04-09 ENCOUNTER — Encounter: Payer: Self-pay | Admitting: Physician Assistant

## 2023-04-09 DIAGNOSIS — M542 Cervicalgia: Secondary | ICD-10-CM

## 2023-04-09 NOTE — Telephone Encounter (Signed)
 Last OV: 01/14/23  Next OV: 04/16/23  Last Filled: 11/09/22  Quantity: 120 w/ 2 refills

## 2023-04-09 NOTE — Telephone Encounter (Signed)
 Please see pt msg and send in refill for patient at your convenience

## 2023-04-12 NOTE — Telephone Encounter (Signed)
 Left message to call [facility] Triage at [phone], option 4.

## 2023-04-16 ENCOUNTER — Encounter: Payer: Self-pay | Admitting: Physician Assistant

## 2023-04-16 ENCOUNTER — Ambulatory Visit (INDEPENDENT_AMBULATORY_CARE_PROVIDER_SITE_OTHER): Payer: Medicare HMO | Admitting: Physician Assistant

## 2023-04-16 VITALS — BP 164/68 | HR 70 | Ht 61.0 in | Wt 112.4 lb

## 2023-04-16 DIAGNOSIS — E538 Deficiency of other specified B group vitamins: Secondary | ICD-10-CM

## 2023-04-16 DIAGNOSIS — I10 Essential (primary) hypertension: Secondary | ICD-10-CM

## 2023-04-16 DIAGNOSIS — G8929 Other chronic pain: Secondary | ICD-10-CM

## 2023-04-16 DIAGNOSIS — F419 Anxiety disorder, unspecified: Secondary | ICD-10-CM | POA: Diagnosis not present

## 2023-04-16 DIAGNOSIS — M542 Cervicalgia: Secondary | ICD-10-CM | POA: Diagnosis not present

## 2023-04-16 NOTE — Progress Notes (Signed)
 Patient ID: Bethany Carpenter, female    DOB: 10-Nov-1933, 89 y.o.   MRN: [id number]   Assessment & Plan:  Chronic neck pain Assessment & Plan: Stable on Tramadol  50 mg QID - has taken this for years. PDMP reviewed today, no red flags, filling appropriately.  Also takes Tylenol  500 mg daily and heating pad to help with pain.  Sleeps well, pain doesn't keep her awake.  Denies any dizziness or other side effects.   -Consider steroid injection for pain management. Patient to think about it and message if interested.   Essential hypertension Assessment & Plan: Elevated today, WCS history. Runs 130s/70s at home.. Cont Lisinopril  20 mg; Coreg  12.5 mg BID.  Monitor at home.    Anxiety  B12 deficiency    Vitamin B12 deficiency Discussed importance of B12 for nerve health. Patient has not picked up prescribed B12 due to insurance not covering it. -Recommend over-the-counter women's multivitamin with B12. Patient to check for this at pharmacy.      Anxiety / stress - encouraged patient to avoid watching the news as this seems to be triggering for her   Return in about 3 months (around 07/15/2023) for recheck/follow-up.    Subjective:    Chief Complaint  Patient presents with   Follow-up    HPI. Discussed the use of AI scribe software for clinical note transcription with the patient, who gave verbal consent to proceed.  History of Present Illness   The patient, with a history of arthritis, presents with chronic pain that she describes as a constant part of her life. She reports that she is so accustomed to the pain that she often forgets to mention it during medical consultations. The pain is severe enough that she would typically use a heating pad at home for relief. She also takes tramadol  for pain management. The patient reports no changes in the severity or nature of the pain.  The patient also mentions forgetting her hearing aids, indicating potential hearing issues. She  reports only wearing the hearing aids when going out.  In addition, the patient mentions a recent birthday, turning 56. Despite her age and chronic pain, she appears to maintain an active lifestyle, including driving and feeding birds. She does express being stressed over the news and politics.   The patient also mentions a prescription for B12 that she has not picked up due to insurance not covering it. She reports occasional leg cramps but has not noticed a significant impact from not taking the B12.       Past Medical History:  Diagnosis Date   Allergy    Arthritis    Cancer (HCC)    Skin   Colon polyps    Heart disease    History of chickenpox    Hyperlipidemia    Hypertension    Presence of permanent cardiac pacemaker 01/12/2019    Past Surgical History:  Procedure Laterality Date   ANGIOPLASTY  03/08/2007   BREAST BIOPSY  09/28/2003   CATARACT EXTRACTION  11/25/2005   04/07/2006   CERVICAL SPINE SURGERY  11/24/2012   02/02/2008   INSERT / REPLACE / REMOVE PACEMAKER  01/12/2019   PACEMAKER IMPLANT N/A 01/12/2019   Procedure: PACEMAKER IMPLANT;  Surgeon: Kelsie Agent, MD;  Location: [facility];  Service: Cardiovascular;  Laterality: N/A;   TONSILLECTOMY  1952   TUBAL LIGATION  1970    Family History  Problem Relation Age of Onset   Hearing loss Mother  Hypertension Mother    Osteoporosis Mother    Arthritis Mother    Cancer Father        Skin   Hearing loss Father    Arthritis Father    Diabetes Brother    Stroke Brother    Early death Brother    Early death Maternal Grandfather    Early death Paternal Grandfather    Depression Daughter    Early death Son    Breast cancer Neg Hx    Ovarian cancer Neg Hx     Social History   Tobacco Use   Smoking status: Never   Smokeless tobacco: Never  Vaping Use   Vaping status: Never Used  Substance Use Topics   Alcohol use: No   Drug use: No     Allergies  Allergen Reactions   Other Hives and  Itching    Fragrances   Nickel    Penicillins Rash    Did it involve swelling of the face/tongue/throat, SOB, or low BP? No Did it involve sudden or severe rash/hives, skin peeling, or any reaction on the inside of your mouth or nose? Yes Did you need to seek medical attention at a hospital or doctor's office? Yes When did it last happen?      years ago  If all above answers are "NO", may proceed with cephalosporin use.    Sulfa Antibiotics Rash    Review of Systems NEGATIVE UNLESS OTHERWISE INDICATED IN HPI      Objective:     BP (!) 164/68 (BP Location: Left Arm, Patient Position: Sitting)   Pulse 70   Ht 5' 1 (1.549 m)   Wt 112 lb 6.4 oz (51 kg)   SpO2 97%   BMI 21.24 kg/m   Wt Readings from Last 3 Encounters:  04/16/23 112 lb 6.4 oz (51 kg)  02/02/23 111 lb 3.2 oz (50.4 kg)  01/26/23 111 lb 2 oz (50.4 kg)    BP Readings from Last 3 Encounters:  04/16/23 (!) 164/68  02/25/23 120/70  02/02/23 130/70     Physical Exam Vitals and nursing note reviewed.  Constitutional:      General: She is not in acute distress.    Appearance: Normal appearance. She is not ill-appearing.  HENT:     Head: Normocephalic.  Neck:     Vascular: No carotid bruit.  Cardiovascular:     Rate and Rhythm: Normal rate and regular rhythm.     Comments: pacemaker Pulmonary:     Effort: Pulmonary effort is normal.     Breath sounds: Normal breath sounds.  Musculoskeletal:     Left shoulder: Decreased range of motion (external rotation limited).     Cervical back: Rigidity (chronic pain and spasm) present.  Lymphadenopathy:     Cervical: No cervical adenopathy.  Skin:    General: Skin is warm.  Neurological:     Mental Status: She is alert.  Psychiatric:        Mood and Affect: Mood normal.        Behavior: Behavior normal.           Darrion Macaulay M Regenia Erck, PA-C

## 2023-04-16 NOTE — Patient Instructions (Signed)
 VISIT SUMMARY:  During today's visit, we discussed your chronic arthritis pain, potential hearing issues, and your recent birthday. We also reviewed your current medications and addressed your concerns about Vitamin B12 deficiency.  YOUR PLAN:  -ARTHRITIS: Arthritis is a condition that causes inflammation and pain in your joints. We discussed your current pain management with a heating pad and tramadol . There is an option for a steroid injection to help with the pain, which you can consider and let us  know if you are interested.  -HYPERTENSION: Hypertension, or high blood pressure, was noted during your visit, possibly due to pain and rushing. We confirmed that you are taking your medication as prescribed. We will recheck your blood pressure before you leave the office.  -VITAMIN B12 DEFICIENCY: Vitamin B12 deficiency can affect nerve health and cause symptoms like leg cramps. Since your insurance does not cover the prescribed B12, we recommend trying an over-the-counter women's multivitamin with B12, which you can find at the pharmacy.  INSTRUCTIONS:  Please consider the option of a steroid injection for your arthritis pain and let us  know if you decide to proceed. Recheck your blood pressure before leaving the office. Look for an over-the-counter women's multivitamin with B12 at your pharmacy.

## 2023-04-19 NOTE — Assessment & Plan Note (Signed)
Elevated today, WCS history. Runs 130s/70s at home.. Cont Lisinopril 20 mg; Coreg 12.5 mg BID.  Monitor at home.

## 2023-04-19 NOTE — Assessment & Plan Note (Signed)
 Stable on Tramadol  50 mg QID - has taken this for years. PDMP reviewed today, no red flags, filling appropriately.  Also takes Tylenol  500 mg daily and heating pad to help with pain.  Sleeps well, pain doesn't keep her awake.  Denies any dizziness or other side effects.   -Consider steroid injection for pain management. Patient to think about it and message if interested.

## 2023-05-04 ENCOUNTER — Encounter: Payer: Self-pay | Admitting: Cardiology

## 2023-05-04 ENCOUNTER — Ambulatory Visit (INDEPENDENT_AMBULATORY_CARE_PROVIDER_SITE_OTHER): Payer: Medicare HMO

## 2023-05-04 DIAGNOSIS — I442 Atrioventricular block, complete: Secondary | ICD-10-CM

## 2023-05-04 LAB — CUP PACEART REMOTE DEVICE CHECK
Battery Remaining Longevity: 83 mo
Battery Voltage: 2.98 V
Brady Statistic AP VP Percent: 14.98 %
Brady Statistic AP VS Percent: 0 %
Brady Statistic AS VP Percent: 85 %
Brady Statistic AS VS Percent: 0.02 %
Brady Statistic RA Percent Paced: 14.96 %
Brady Statistic RV Percent Paced: 99.98 %
Date Time Interrogation Session: 20250127235241
Implantable Lead Connection Status: 753985
Implantable Lead Connection Status: 753985
Implantable Lead Implant Date: 20201008
Implantable Lead Implant Date: 20201008
Implantable Lead Location: 753859
Implantable Lead Location: 753860
Implantable Lead Model: 5076
Implantable Lead Model: 5076
Implantable Pulse Generator Implant Date: 20201008
Lead Channel Impedance Value: 266 Ohm
Lead Channel Impedance Value: 285 Ohm
Lead Channel Impedance Value: 342 Ohm
Lead Channel Impedance Value: 380 Ohm
Lead Channel Pacing Threshold Amplitude: 0.625 V
Lead Channel Pacing Threshold Amplitude: 0.875 V
Lead Channel Pacing Threshold Pulse Width: 0.4 ms
Lead Channel Pacing Threshold Pulse Width: 0.4 ms
Lead Channel Sensing Intrinsic Amplitude: 1 mV
Lead Channel Sensing Intrinsic Amplitude: 1 mV
Lead Channel Sensing Intrinsic Amplitude: 6.875 mV
Lead Channel Sensing Intrinsic Amplitude: 6.875 mV
Lead Channel Setting Pacing Amplitude: 1.5 V
Lead Channel Setting Pacing Amplitude: 2.5 V
Lead Channel Setting Pacing Pulse Width: 0.4 ms
Lead Channel Setting Sensing Sensitivity: 5.6 mV
Zone Setting Status: 755011
Zone Setting Status: 755011

## 2023-05-05 ENCOUNTER — Other Ambulatory Visit: Payer: Self-pay | Admitting: Physician Assistant

## 2023-05-05 DIAGNOSIS — I10 Essential (primary) hypertension: Secondary | ICD-10-CM

## 2023-05-11 ENCOUNTER — Other Ambulatory Visit: Payer: Self-pay | Admitting: Physician Assistant

## 2023-05-11 DIAGNOSIS — M542 Cervicalgia: Secondary | ICD-10-CM

## 2023-05-11 NOTE — Telephone Encounter (Signed)
Last OV: 04/16/23  Next OV: 07/19/23  Last Filled: 04/09/23  Quantity: 120

## 2023-05-20 IMAGING — DX DG CERVICAL SPINE COMPLETE 4+V
5 series · 5 of 5 positions shown · non-contrast
Comparison: X-ray 03/08/2019.

CLINICAL DATA: Chronic neck pain, worsening over the past month.

EXAM:
CERVICAL SPINE - COMPLETE 4+ VIEW

[c-spine lat]
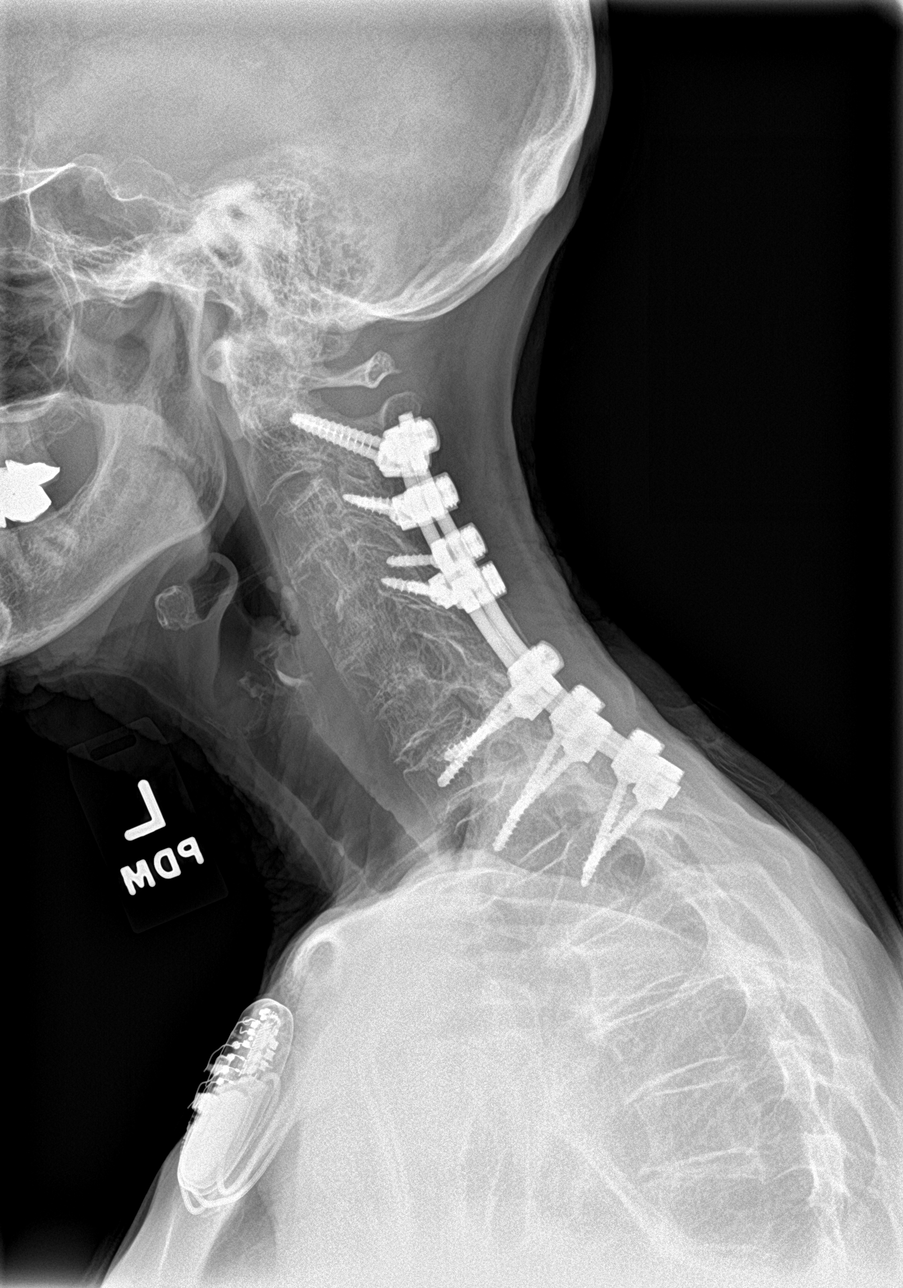

[c-spine obl (1 of 2)]
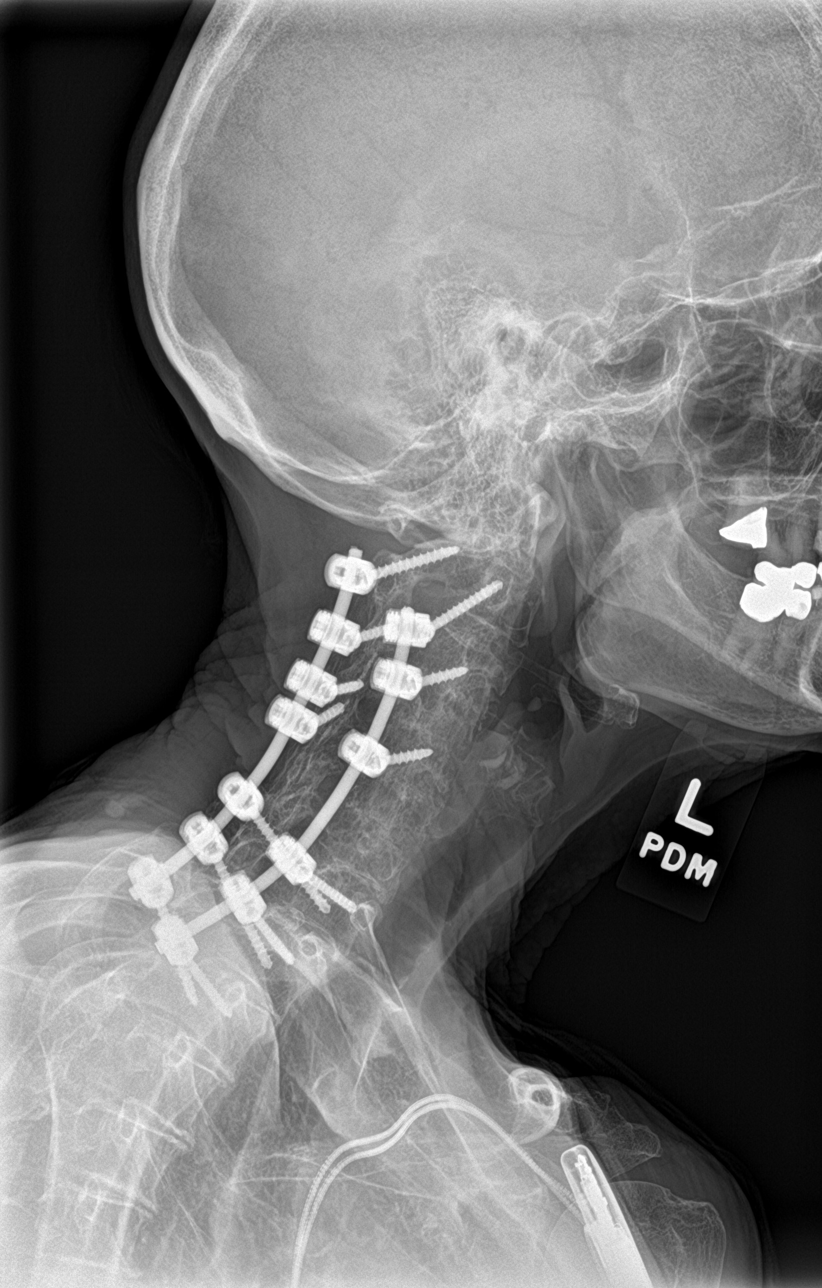

[c-spine obl (2 of 2)]
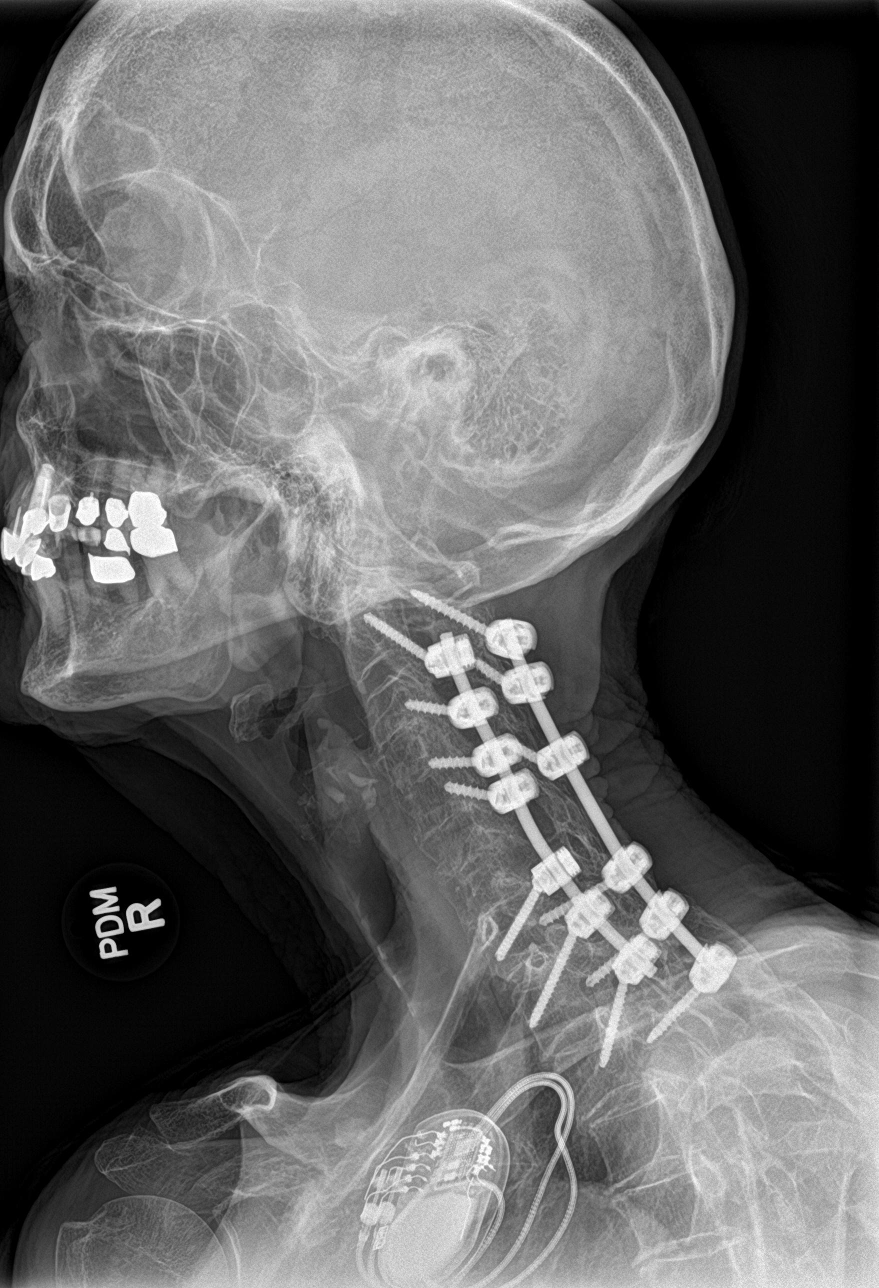

[c-spine ap]
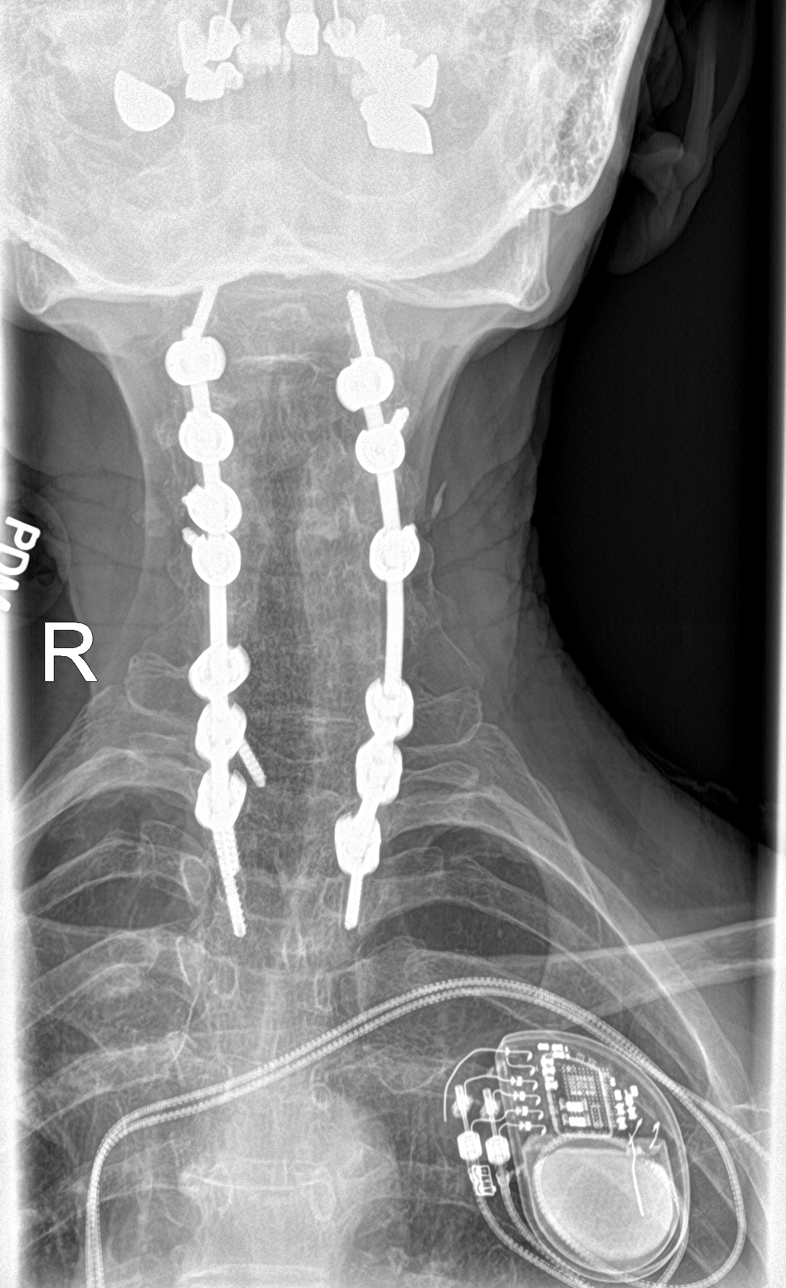

[c-spine open mouth]
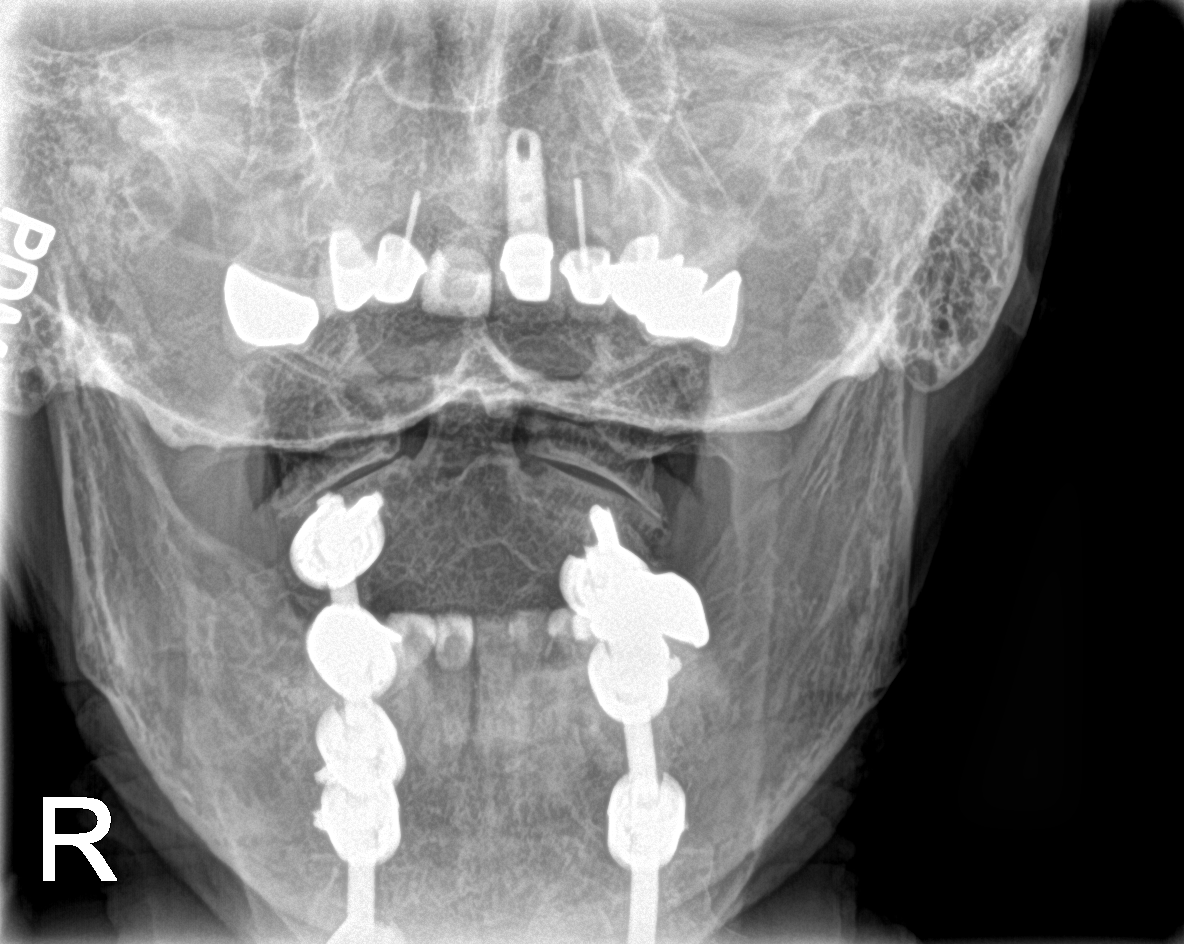

[5 of 5 positions shown; findings below may reference images not displayed]

FINDINGS: No recent fracture is seen. Osteopenia is seen in bony structures.
There is previous posterior surgical fusion from C2-T3 levels.
Slight decrease in height of body of T1 vertebra as not changed.
Prevertebral soft tissues are unremarkable. Evaluation of neural
foramina is limited by surgical hardware. Pacemaker battery is seen
in the left infraclavicular region.
IMPRESSION: No recent fracture is seen. There is previous posterior surgical
fusion from C2-T3 levels. No significant interval changes are noted
since 03/08/2019.

## 2023-05-28 NOTE — Telephone Encounter (Signed)
 Call placed to patient, left detailed message, ok per dpr. Advised f/u on recommended PUS per Dr. Karma Greaser. Please return call to office at [phone], option 4.

## 2023-06-10 ENCOUNTER — Other Ambulatory Visit: Payer: Self-pay | Admitting: Physician Assistant

## 2023-06-10 DIAGNOSIS — M542 Cervicalgia: Secondary | ICD-10-CM

## 2023-06-10 NOTE — Telephone Encounter (Signed)
 Last OV: 04/16/23  Next OV: 07/19/23  Last FIlled: 05/11/23  Quantity: 120

## 2023-06-13 NOTE — Progress Notes (Signed)
 Remote pacemaker transmission.

## 2023-06-23 NOTE — Telephone Encounter (Signed)
 No response from patient.   Dr. Karma Greaser -please review and advise.

## 2023-06-24 NOTE — Telephone Encounter (Signed)
Order canceled.   Encounter closed.

## 2023-07-14 ENCOUNTER — Other Ambulatory Visit: Payer: Self-pay | Admitting: Physician Assistant

## 2023-07-14 DIAGNOSIS — M542 Cervicalgia: Secondary | ICD-10-CM

## 2023-07-14 NOTE — Telephone Encounter (Signed)
 Last OV: 04/16/23  Next OV: 07/19/23  Last Filled: 06/10/23  Quantity: 120

## 2023-07-19 ENCOUNTER — Ambulatory Visit (INDEPENDENT_AMBULATORY_CARE_PROVIDER_SITE_OTHER): Payer: Medicare HMO | Admitting: Physician Assistant

## 2023-07-19 ENCOUNTER — Encounter: Payer: Self-pay | Admitting: Physician Assistant

## 2023-07-19 VITALS — BP 160/70 | HR 93 | Temp 98.2°F | Ht 61.0 in | Wt 114.0 lb

## 2023-07-19 DIAGNOSIS — I10 Essential (primary) hypertension: Secondary | ICD-10-CM

## 2023-07-19 DIAGNOSIS — G8929 Other chronic pain: Secondary | ICD-10-CM

## 2023-07-19 DIAGNOSIS — E538 Deficiency of other specified B group vitamins: Secondary | ICD-10-CM

## 2023-07-19 DIAGNOSIS — M542 Cervicalgia: Secondary | ICD-10-CM | POA: Diagnosis not present

## 2023-07-19 MED ORDER — LISINOPRIL 40 MG PO TABS: 40.0000 mg | ORAL_TABLET | Freq: Every day | ORAL | 2 refills | Status: DC

## 2023-07-19 NOTE — Progress Notes (Signed)
 Patient ID: Jeanene Mena, female    DOB: 1933-05-15, 89 y.o.   MRN: [id number]   Assessment & Plan:  Chronic neck pain  Essential hypertension  B12 deficiency  Other orders -     Lisinopril; Take 1 tablet (40 mg total) by mouth daily.  Dispense: 30 tablet; Refill: 2      Assessment & Plan Hypertension Blood pressure elevated at 160/70 mmHg. Managed with lisinopril 20 mg twice daily and Coreg 12.5 mg twice daily. Plan to adjust lisinopril dosage to achieve systolic readings in the 130s-140s. No issues with lisinopril reported. Discussed potential side effects of dosage adjustment, including dizziness and headaches, and emphasized home blood pressure monitoring. - Increase lisinopril to 40 mg once daily. - Continue Coreg 12.5 mg twice daily. - Monitor blood pressure at home, morning and evening. - Reassess blood pressure control at next visit.  Chronic Neck Pain Chronic neck pain due to arthritis and previous neck fracture. Managed with tramadol 50 mg four times daily and Tylenol. Pain levels stable overall, she has a hard-time pinpointing a pain level number. Declined steroid injection for her neck as we discussed previously. - Continue tramadol 50 mg four times daily. - Continue Tylenol as needed. - Consider steroid injections if pain becomes unmanageable. PDMP reviewed today, no red flags, filling appropriately.    Vitamin B12 Deficiency Vitamin B12 deficiency managed with daily over-the-counter B12 supplements. Declined blood work to recheck B12 levels. Discussed B12's role in nerve health and potential for tingling in extremities if deficient. - Continue daily over-the-counter B12 supplementation. - Consider rechecking B12 levels if symptoms suggest deficiency.  General Health Maintenance Probiotic for colon health and calcium supplements taken. Aware of need for regular cholesterol monitoring and COVID-19 vaccinations. Plans to schedule COVID-19 vaccination as it has  been six months since the last dose. - Plan for cholesterol blood work at next visit, ensuring fasting prior to the test. - Schedule COVID-19 vaccination.      Return in about 3 months (around 10/18/2023) for recheck/follow-up, fasting labs.    Subjective:    Chief Complaint  Patient presents with   Medical Management of Chronic Issues    Pt in the office for 3 mon f/u and BP check; pt has been monitoring BP at home and states it's been going well; pt says pain is still a factor and sees this is normally when readings or more elevated;     HPI Discussed the use of AI scribe software for clinical note transcription with the patient, who gave verbal consent to proceed.  History of Present Illness Katheryne Gorr is an 89 year old female with hypertension who presents with elevated blood pressure, med recheck.  She experiences chronic neck pain, described as constant and fluctuating in intensity. The pain is managed with tramadol 50 mg four times daily and Tylenol, along with the use of a heating pad. Despite the pain impacting her daily activities, she remains active, managing her backyard and engaging in reading.  She has a history of hypertension and is currently on lisinopril 20 mg twice daily and Coreg 12.5 mg twice daily. She does not regularly monitor her blood pressure at home but acknowledges that it may be elevated when she is in pain.  She has a history of B12 deficiency and takes an over-the-counter B12 supplement daily. She also takes a probiotic for colon health and calcium supplements. She experiences some numbness, which she attributes to her neck surgery and arthritis.  Her diet  is described as healthy, though she occasionally consumes frozen meals and enjoys bread and cheese. She does not cook regularly.     Past Medical History:  Diagnosis Date   Allergy    Arthritis    Cancer (HCC)    Skin   Colon polyps    Heart disease    History of chickenpox     Hyperlipidemia    Hypertension    Presence of permanent cardiac pacemaker 01/12/2019    Past Surgical History:  Procedure Laterality Date   ANGIOPLASTY  03/08/2007   BREAST BIOPSY  09/28/2003   CATARACT EXTRACTION  11/25/2005   04/07/2006   CERVICAL SPINE SURGERY  11/24/2012   02/02/2008   INSERT / REPLACE / REMOVE PACEMAKER  01/12/2019   PACEMAKER IMPLANT N/A 01/12/2019   Procedure: PACEMAKER IMPLANT;  Surgeon: Jolly Needle, MD;  Location: [facility];  Service: Cardiovascular;  Laterality: N/A;   TONSILLECTOMY  1952   TUBAL LIGATION  1970    Family History  Problem Relation Age of Onset   Hearing loss Mother    Hypertension Mother    Osteoporosis Mother    Arthritis Mother    Cancer Father        Skin   Hearing loss Father    Arthritis Father    Diabetes Brother    Stroke Brother    Early death Brother    Early death Maternal Grandfather    Early death Paternal Grandfather    Depression Daughter    Early death Son    Breast cancer Neg Hx    Ovarian cancer Neg Hx     Social History   Tobacco Use   Smoking status: Never   Smokeless tobacco: Never  Vaping Use   Vaping status: Never Used  Substance Use Topics   Alcohol use: No   Drug use: No     Allergies  Allergen Reactions   Other Hives and Itching    Fragrances   Nickel    Penicillins Rash    Did it involve swelling of the face/tongue/throat, SOB, or low BP? No Did it involve sudden or severe rash/hives, skin peeling, or any reaction on the inside of your mouth or nose? Yes Did you need to seek medical attention at a hospital or doctor's office? Yes When did it last happen?      years ago  If all above answers are "NO", may proceed with cephalosporin use.    Sulfa Antibiotics Rash    Review of Systems NEGATIVE UNLESS OTHERWISE INDICATED IN HPI      Objective:     BP (!) 160/70   Pulse 93   Temp 98.2 F (36.8 C) (Temporal)   Ht 5\' 1"  (1.549 m)   Wt 114 lb (51.7 kg)   SpO2 98%    BMI 21.54 kg/m   Wt Readings from Last 3 Encounters:  07/19/23 114 lb (51.7 kg)  04/16/23 112 lb 6.4 oz (51 kg)  02/02/23 111 lb 3.2 oz (50.4 kg)    BP Readings from Last 3 Encounters:  07/19/23 (!) 160/70  04/16/23 (!) 164/68  02/25/23 120/70     Physical Exam Vitals and nursing note reviewed.  Constitutional:      General: She is not in acute distress.    Appearance: Normal appearance. She is not ill-appearing.  HENT:     Head: Normocephalic.  Neck:     Vascular: No carotid bruit.  Cardiovascular:     Rate and Rhythm: Normal  rate and regular rhythm.     Comments: pacemaker Pulmonary:     Effort: Pulmonary effort is normal.     Breath sounds: Normal breath sounds.  Musculoskeletal:     Left shoulder: Decreased range of motion (external rotation limited).     Cervical back: Rigidity (chronic pain and spasm) present.     Comments: Arthritis changes noted in fingers   Lymphadenopathy:     Cervical: No cervical adenopathy.  Skin:    General: Skin is warm.  Neurological:     Mental Status: She is alert.  Psychiatric:        Mood and Affect: Mood normal.        Behavior: Behavior normal.             Navjot Pilgrim M Margarito Dehaas, PA-C

## 2023-08-03 ENCOUNTER — Ambulatory Visit (INDEPENDENT_AMBULATORY_CARE_PROVIDER_SITE_OTHER): Payer: Medicare HMO

## 2023-08-03 DIAGNOSIS — I442 Atrioventricular block, complete: Secondary | ICD-10-CM | POA: Diagnosis not present

## 2023-08-03 LAB — CUP PACEART REMOTE DEVICE CHECK
Battery Remaining Longevity: 81 mo
Battery Voltage: 2.98 V
Brady Statistic AP VP Percent: 9.35 %
Brady Statistic AP VS Percent: 0 %
Brady Statistic AS VP Percent: 90.59 %
Brady Statistic AS VS Percent: 0.06 %
Brady Statistic RA Percent Paced: 9.37 %
Brady Statistic RV Percent Paced: 99.94 %
Date Time Interrogation Session: 20250429013401
Implantable Lead Connection Status: 753985
Implantable Lead Connection Status: 753985
Implantable Lead Implant Date: 20201008
Implantable Lead Implant Date: 20201008
Implantable Lead Location: 753859
Implantable Lead Location: 753860
Implantable Lead Model: 5076
Implantable Lead Model: 5076
Implantable Pulse Generator Implant Date: 20201008
Lead Channel Impedance Value: 247 Ohm
Lead Channel Impedance Value: 285 Ohm
Lead Channel Impedance Value: 342 Ohm
Lead Channel Impedance Value: 399 Ohm
Lead Channel Pacing Threshold Amplitude: 0.625 V
Lead Channel Pacing Threshold Amplitude: 0.875 V
Lead Channel Pacing Threshold Pulse Width: 0.4 ms
Lead Channel Pacing Threshold Pulse Width: 0.4 ms
Lead Channel Sensing Intrinsic Amplitude: 1.5 mV
Lead Channel Sensing Intrinsic Amplitude: 1.5 mV
Lead Channel Sensing Intrinsic Amplitude: 6.875 mV
Lead Channel Sensing Intrinsic Amplitude: 6.875 mV
Lead Channel Setting Pacing Amplitude: 1.5 V
Lead Channel Setting Pacing Amplitude: 2.5 V
Lead Channel Setting Pacing Pulse Width: 0.4 ms
Lead Channel Setting Sensing Sensitivity: 5.6 mV
Zone Setting Status: 755011
Zone Setting Status: 755011

## 2023-08-06 ENCOUNTER — Encounter: Payer: Self-pay | Admitting: Cardiology

## 2023-08-13 ENCOUNTER — Other Ambulatory Visit: Payer: Self-pay | Admitting: Physician Assistant

## 2023-08-13 DIAGNOSIS — M542 Cervicalgia: Secondary | ICD-10-CM

## 2023-08-13 NOTE — Telephone Encounter (Signed)
 Last OV: 07/19/23  Next OV: 10/19/23  Last Filled: 07/14/23  Quantity: 120

## 2023-08-14 ENCOUNTER — Other Ambulatory Visit: Payer: Self-pay | Admitting: Physician Assistant

## 2023-08-14 DIAGNOSIS — I10 Essential (primary) hypertension: Secondary | ICD-10-CM

## 2023-08-16 ENCOUNTER — Ambulatory Visit: Payer: Medicare HMO | Admitting: Physician Assistant

## 2023-08-30 ENCOUNTER — Other Ambulatory Visit: Payer: Self-pay | Admitting: Physician Assistant

## 2023-08-30 DIAGNOSIS — E785 Hyperlipidemia, unspecified: Secondary | ICD-10-CM

## 2023-09-16 ENCOUNTER — Encounter: Payer: Self-pay | Admitting: Physician Assistant

## 2023-09-16 ENCOUNTER — Other Ambulatory Visit: Payer: Self-pay | Admitting: Physician Assistant

## 2023-09-16 ENCOUNTER — Other Ambulatory Visit: Payer: Self-pay

## 2023-09-16 DIAGNOSIS — M542 Cervicalgia: Secondary | ICD-10-CM

## 2023-09-16 MED ORDER — LISINOPRIL 10 MG PO TABS: 10.0000 mg | ORAL_TABLET | Freq: Two times a day (BID) | ORAL | 2 refills | Status: DC

## 2023-09-16 NOTE — Telephone Encounter (Signed)
 Last OV: 07/19/23  Next OV: 10/19/23  Last Filled: 08/13/23  Quantity: 120

## 2023-09-17 ENCOUNTER — Telehealth: Payer: Self-pay | Admitting: Obstetrics and Gynecology

## 2023-09-17 NOTE — Telephone Encounter (Signed)
 Bone support follow-up

## 2023-09-17 NOTE — Progress Notes (Signed)
 Remote pacemaker transmission.

## 2023-10-15 ENCOUNTER — Encounter: Payer: Self-pay | Admitting: Physician Assistant

## 2023-10-15 ENCOUNTER — Other Ambulatory Visit: Payer: Self-pay | Admitting: Physician Assistant

## 2023-10-15 DIAGNOSIS — M542 Cervicalgia: Secondary | ICD-10-CM

## 2023-10-15 MED ORDER — TRAMADOL HCL 50 MG PO TABS: 50.0000 mg | ORAL_TABLET | Freq: Four times a day (QID) | ORAL | 0 refills | Status: DC | PRN

## 2023-10-15 NOTE — Telephone Encounter (Signed)
 Please see pt msg and advise on early refill

## 2023-10-19 ENCOUNTER — Ambulatory Visit (INDEPENDENT_AMBULATORY_CARE_PROVIDER_SITE_OTHER): Admitting: Physician Assistant

## 2023-10-19 ENCOUNTER — Encounter: Payer: Self-pay | Admitting: Physician Assistant

## 2023-10-19 VITALS — BP 138/78 | HR 78 | Temp 97.4°F | Wt 113.4 lb

## 2023-10-19 DIAGNOSIS — Z1283 Encounter for screening for malignant neoplasm of skin: Secondary | ICD-10-CM | POA: Diagnosis not present

## 2023-10-19 DIAGNOSIS — D485 Neoplasm of uncertain behavior of skin: Secondary | ICD-10-CM | POA: Diagnosis not present

## 2023-10-19 DIAGNOSIS — E538 Deficiency of other specified B group vitamins: Secondary | ICD-10-CM

## 2023-10-19 DIAGNOSIS — R7309 Other abnormal glucose: Secondary | ICD-10-CM | POA: Diagnosis not present

## 2023-10-19 DIAGNOSIS — G8929 Other chronic pain: Secondary | ICD-10-CM

## 2023-10-19 DIAGNOSIS — D649 Anemia, unspecified: Secondary | ICD-10-CM | POA: Diagnosis not present

## 2023-10-19 DIAGNOSIS — E782 Mixed hyperlipidemia: Secondary | ICD-10-CM

## 2023-10-19 DIAGNOSIS — M542 Cervicalgia: Secondary | ICD-10-CM | POA: Diagnosis not present

## 2023-10-19 LAB — COMPREHENSIVE METABOLIC PANEL WITH GFR
ALT: 26 U/L (ref 0–35)
AST: 34 U/L (ref 0–37)
Albumin: 4.3 g/dL (ref 3.5–5.2)
Alkaline Phosphatase: 57 U/L (ref 39–117)
BUN: 19 mg/dL (ref 6–23)
CO2: 31 meq/L (ref 19–32)
Calcium: 9.4 mg/dL (ref 8.4–10.5)
Chloride: 103 meq/L (ref 96–112)
Creatinine, Ser: 0.72 mg/dL (ref 0.40–1.20)
GFR: 75.07 mL/min (ref >60.00)
Glucose, Bld: 91 mg/dL (ref 70–99)
Potassium: 4.1 meq/L (ref 3.5–5.1)
Sodium: 138 meq/L (ref 135–145)
Total Bilirubin: 0.4 mg/dL (ref 0.2–1.2)
Total Protein: 7.1 g/dL (ref 6.0–8.3)

## 2023-10-19 LAB — CBC WITH DIFFERENTIAL/PLATELET
Basophils Absolute: 0.1 K/uL (ref 0.0–0.1)
Basophils Relative: 1.6 % (ref 0.0–3.0)
Eosinophils Absolute: 0.2 K/uL (ref 0.0–0.7)
Eosinophils Relative: 4.9 % (ref 0.0–5.0)
HCT: 36.2 % (ref 36.0–46.0)
Hemoglobin: 12 g/dL (ref 12.0–15.0)
Lymphocytes Relative: 34 % (ref 12.0–46.0)
Lymphs Abs: 1.3 K/uL (ref 0.7–4.0)
MCHC: 33 g/dL (ref 30.0–36.0)
MCV: 88.1 fl (ref 78.0–100.0)
Monocytes Absolute: 0.4 K/uL (ref 0.1–1.0)
Monocytes Relative: 9.9 % (ref 3.0–12.0)
Neutro Abs: 1.9 K/uL (ref 1.4–7.7)
Neutrophils Relative %: 49.6 % (ref 43.0–77.0)
Platelets: 231 K/uL (ref 150.0–400.0)
RBC: 4.11 Mil/uL (ref 3.87–5.11)
RDW: 14.2 % (ref 11.5–15.5)
WBC: 3.8 K/uL — ABNORMAL LOW (ref 4.0–10.5)

## 2023-10-19 LAB — LIPID PANEL
Cholesterol: 148 mg/dL (ref 0–200)
HDL: 54.4 mg/dL (ref >39.00)
LDL Cholesterol: 73 mg/dL (ref 0–99)
NonHDL: 93.69
Total CHOL/HDL Ratio: 3
Triglycerides: 104 mg/dL (ref 0.0–149.0)
VLDL: 20.8 mg/dL (ref 0.0–40.0)

## 2023-10-19 LAB — HEMOGLOBIN A1C: Hgb A1c MFr Bld: 6.2 % (ref 4.6–6.5)

## 2023-10-19 LAB — VITAMIN B12: Vitamin B-12: 1190 pg/mL — ABNORMAL HIGH (ref 211–911)

## 2023-10-19 NOTE — Patient Instructions (Signed)
  VISIT SUMMARY: Today, we discussed your chronic neck pain, a concerning skin lesion, high cholesterol, and vitamin B12 deficiency. We also reviewed your general health maintenance and planned necessary follow-ups.  YOUR PLAN: CHRONIC NECK PAIN: Your chronic neck pain is currently well-managed with tramadol , but you occasionally experience flare-ups. -Continue taking tramadol  50 mg four times daily. -Consider a referral to sports medicine if your pain worsens or if you decide to explore further evaluation and treatment options.  SKIN LESION: You have a large lesion on your left cheek that is concerning for possible skin cancer, especially given your family history and significant sun exposure. -We will refer you to dermatology for an evaluation of the lesion and a general skin cancer screening.  HIGH CHOLESTEROL: Your high cholesterol is being managed with simvastatin . -Continue taking simvastatin  40 mg daily. -We will order a lipid panel to check your current cholesterol levels.  VITAMIN B12 DEFICIENCY: You are currently taking B12 supplements for your vitamin B12 deficiency. -Continue taking your B12 supplements. -We will order a B12 level test to ensure your supplementation is adequate.  GENERAL HEALTH MAINTENANCE: We discussed the importance of regular blood work to monitor your chronic conditions. -We will order a CBC, CMP, A1c, and hemoglobin levels. -Schedule a follow-up appointment in three months or sooner if needed.                      Contains text generated by [employer]bridge.                                 Contains text generated by [employer]bridge.

## 2023-10-19 NOTE — Telephone Encounter (Signed)
 Patient coming into the office for visit this morning

## 2023-10-19 NOTE — Progress Notes (Signed)
 Patient ID: Ladonne Carpenter, female    DOB: [birth year], 89 y.o.   MRN: [id number]   Assessment & Plan:  Chronic neck pain  Cervicalgia  Neoplasm of uncertain behavior of skin -     Ambulatory referral to Dermatology  Skin cancer screening -     Ambulatory referral to Dermatology  Mixed hyperlipidemia -     Lipid panel  B12 deficiency -     Vitamin B12  Elevated glucose -     Comprehensive metabolic panel with GFR -     Hemoglobin A1c  Low hemoglobin -     CBC with Differential/Platelet      Assessment and Plan Assessment & Plan Cervicalgia Chronic neck pain, well-managed on tramadol  50 mg four times daily. Occasionally experiences exacerbations. Discussed potential referral to sports medicine for further evaluation and possible interventions such as ultrasound-guided steroid injections, which can provide relief for 6 to 12 months. She is hesitant about referral and will consider options. Sports medicine can provide a neutral evaluation and determine if further referral to orthopedics or neurosurgery is needed. - Continue tramadol  50 mg four times daily - Consider referral to sports medicine if pain worsens or she decides to pursue further evaluation  Skin Lesion Large lesion on left cheek with a pink base and scaling, possibly warty appearance. History of skin cancer and significant sun exposure. Lesion is concerning for possible skin cancer. Dermatology referral is necessary due to the long wait time for appointments, which can be 6 to 12 months. - Refer to dermatology for evaluation of the lesion and general skin cancer screening  Hyperlipidemia High cholesterol, managed with simvastatin  40 mg daily. Plan to recheck lipid panel to assess current status. - Continue simvastatin  40 mg daily - Order lipid panel  Vitamin B12 Deficiency Vitamin B12 deficiency, currently taking B12 supplements. Plan to recheck B12 levels to ensure adequacy of supplementation. - Continue  B12 supplementation - Order B12 level  General Health Maintenance Routine follow-up and health maintenance. Blood pressure is well-controlled at 138/78 mmHg. Weight is stable. Discussed the importance of regular blood work to monitor chronic conditions. - Order CBC, CMP, A1c, and hemoglobin levels - Schedule follow-up in three months or sooner if needed      Return in about 3 months (around 01/19/2024) for recheck/follow-up.    Subjective:    Chief Complaint  Patient presents with   Follow-up    Pt in for chronic neck pain     HPI Discussed the use of AI scribe software for clinical note transcription with the patient, who gave verbal consent to proceed.  History of Present Illness Bethany Carpenter is an 89 year old female with chronic neck pain who presents for a regular three-month follow-up.  She manages her chronic neck pain with tramadol  50 mg four times daily. Occasionally, the pain worsens, but she has not pursued additional interventions recently. She uses a heating pad and reading to manage discomfort.  There is a family history of skin cancer, as her father had it. She applies ointment to the lesion, which itches and is sometimes picked at. She recalls a previous lesion in 2017 that was supposed to be removed but was not due to logistical issues, and it eventually disappeared.  Her medication regimen includes simvastatin  for high cholesterol and B12 supplements. She recently stopped taking a colon supplement. She monitors her medication refills closely to avoid issues with the pharmacy.  She experiences frequent nighttime awakenings to use the bathroom and wakes  up before her tramadol  dose is due. Despite this, she feels she gets enough sleep by going to bed early and sometimes falling asleep while reading.  She engages in outdoor activities such as feeding birds and other wildlife, although she limits her time outside due to the heat. She is concerned about the energy  required to care for pets, preferring to care for outdoor animals instead.     Past Medical History:  Diagnosis Date   Allergy    Arthritis    Cancer (HCC)    Skin   Colon polyps    Heart disease    History of chickenpox    Hyperlipidemia    Hypertension    Presence of permanent cardiac pacemaker 01/12/2019    Past Surgical History:  Procedure Laterality Date   ANGIOPLASTY  03/08/2007   BREAST BIOPSY  09/28/2003   CATARACT EXTRACTION  11/25/2005   04/07/2006   CERVICAL SPINE SURGERY  11/24/2012   02/02/2008   INSERT / REPLACE / REMOVE PACEMAKER  01/12/2019   PACEMAKER IMPLANT N/A 01/12/2019   Procedure: PACEMAKER IMPLANT;  Surgeon: Kelsie Agent, MD;  Location: [facility];  Service: Cardiovascular;  Laterality: N/A;   TONSILLECTOMY  1952   TUBAL LIGATION  1970    Family History  Problem Relation Age of Onset   Hearing loss Mother    Hypertension Mother    Osteoporosis Mother    Arthritis Mother    Cancer Father        Skin   Hearing loss Father    Arthritis Father    Diabetes Brother    Stroke Brother    Early death Brother    Early death Maternal Grandfather    Early death Paternal Grandfather    Depression Daughter    Early death Son    Breast cancer Neg Hx    Ovarian cancer Neg Hx     Social History   Tobacco Use   Smoking status: Never   Smokeless tobacco: Never  Vaping Use   Vaping status: Never Used  Substance Use Topics   Alcohol use: No   Drug use: No     Allergies  Allergen Reactions   Other Hives and Itching    Fragrances   Nickel    Penicillins Rash    Did it involve swelling of the face/tongue/throat, SOB, or low BP? No Did it involve sudden or severe rash/hives, skin peeling, or any reaction on the inside of your mouth or nose? Yes Did you need to seek medical attention at a hospital or doctor's office? Yes When did it last happen?      years ago  If all above answers are "NO", may proceed with cephalosporin use.     Sulfa Antibiotics Rash    Review of Systems NEGATIVE UNLESS OTHERWISE INDICATED IN HPI      Objective:     BP 138/78 (BP Location: Left Arm, Patient Position: Sitting, Cuff Size: Normal)   Pulse 78   Temp (!) 97.4 F (36.3 C) (Temporal)   Wt 113 lb 6.4 oz (51.4 kg)   SpO2 98%   BMI 21.43 kg/m   Wt Readings from Last 3 Encounters:  10/19/23 113 lb 6.4 oz (51.4 kg)  07/19/23 114 lb (51.7 kg)  04/16/23 112 lb 6.4 oz (51 kg)    BP Readings from Last 3 Encounters:  10/19/23 138/78  07/19/23 (!) 160/70  04/16/23 (!) 164/68     Physical Exam Vitals and nursing note reviewed.  Constitutional:      General: She is not in acute distress.    Appearance: Normal appearance. She is not ill-appearing.  HENT:     Head: Normocephalic.  Neck:     Vascular: No carotid bruit.  Cardiovascular:     Rate and Rhythm: Normal rate and regular rhythm.     Comments: pacemaker Pulmonary:     Effort: Pulmonary effort is normal.     Breath sounds: Normal breath sounds.  Musculoskeletal:     Left shoulder: Decreased range of motion (external rotation limited).     Cervical back: Rigidity (chronic pain and spasm) present.     Comments: Arthritis changes noted in fingers   Lymphadenopathy:     Cervical: No cervical adenopathy.  Skin:    General: Skin is warm.     Findings: Lesion (left cheek - see photo) present.  Neurological:     Mental Status: She is alert.  Psychiatric:        Mood and Affect: Mood normal.        Behavior: Behavior normal.             Kawhi Diebold M Saida Lonon, PA-C

## 2023-10-21 ENCOUNTER — Ambulatory Visit: Payer: Self-pay | Admitting: Physician Assistant

## 2023-11-02 ENCOUNTER — Ambulatory Visit: Payer: Medicare HMO

## 2023-11-02 DIAGNOSIS — I442 Atrioventricular block, complete: Secondary | ICD-10-CM | POA: Diagnosis not present

## 2023-11-03 LAB — CUP PACEART REMOTE DEVICE CHECK
Battery Remaining Longevity: 76 mo
Battery Voltage: 2.97 V
Brady Statistic AP VP Percent: 5.83 %
Brady Statistic AP VS Percent: 0 %
Brady Statistic AS VP Percent: 94.12 %
Brady Statistic AS VS Percent: 0.04 %
Brady Statistic RA Percent Paced: 5.84 %
Brady Statistic RV Percent Paced: 99.96 %
Date Time Interrogation Session: 20250729023038
Implantable Lead Connection Status: 753985
Implantable Lead Connection Status: 753985
Implantable Lead Implant Date: 20201008
Implantable Lead Implant Date: 20201008
Implantable Lead Location: 753859
Implantable Lead Location: 753860
Implantable Lead Model: 5076
Implantable Lead Model: 5076
Implantable Pulse Generator Implant Date: 20201008
Lead Channel Impedance Value: 247 Ohm
Lead Channel Impedance Value: 285 Ohm
Lead Channel Impedance Value: 380 Ohm
Lead Channel Impedance Value: 380 Ohm
Lead Channel Pacing Threshold Amplitude: 0.625 V
Lead Channel Pacing Threshold Amplitude: 0.875 V
Lead Channel Pacing Threshold Pulse Width: 0.4 ms
Lead Channel Pacing Threshold Pulse Width: 0.4 ms
Lead Channel Sensing Intrinsic Amplitude: 1 mV
Lead Channel Sensing Intrinsic Amplitude: 1 mV
Lead Channel Sensing Intrinsic Amplitude: 17.375 mV
Lead Channel Sensing Intrinsic Amplitude: 17.375 mV
Lead Channel Setting Pacing Amplitude: 1.5 V
Lead Channel Setting Pacing Amplitude: 2.5 V
Lead Channel Setting Pacing Pulse Width: 0.4 ms
Lead Channel Setting Sensing Sensitivity: 5.6 mV
Zone Setting Status: 755011
Zone Setting Status: 755011

## 2023-11-04 ENCOUNTER — Ambulatory Visit: Payer: Self-pay | Admitting: Cardiology

## 2023-11-18 ENCOUNTER — Other Ambulatory Visit: Payer: Self-pay | Admitting: Physician Assistant

## 2023-11-18 DIAGNOSIS — M542 Cervicalgia: Secondary | ICD-10-CM

## 2023-11-18 NOTE — Telephone Encounter (Signed)
 Name of Medication: Tramadol 50mg  Name of Pharmacy: [id number] - [place], [place] - [address] AT [facility] [address] [address]  Last Fill or Written Date and Quantity: 10/17/23 120tab caretha Last Office Visit and Type: 10/19/23 office visit (neck pain) Next Office Visit and Type: 01/19/24 follow up Last Controlled Substance Agreement Date: 05/07/2020 Last UDS: none

## 2023-12-12 ENCOUNTER — Other Ambulatory Visit: Payer: Self-pay | Admitting: Physician Assistant

## 2023-12-20 ENCOUNTER — Other Ambulatory Visit: Payer: Self-pay | Admitting: Physician Assistant

## 2023-12-20 DIAGNOSIS — M542 Cervicalgia: Secondary | ICD-10-CM

## 2023-12-20 NOTE — Telephone Encounter (Signed)
 Last OV: 10/19/23  Next OV: 01/19/24  Last Filled: 11/18/23  Quantity: 120

## 2024-01-03 ENCOUNTER — Encounter: Payer: Self-pay | Admitting: Dermatology

## 2024-01-03 ENCOUNTER — Ambulatory Visit: Admitting: Dermatology

## 2024-01-03 DIAGNOSIS — L570 Actinic keratosis: Secondary | ICD-10-CM

## 2024-01-03 DIAGNOSIS — W908XXA Exposure to other nonionizing radiation, initial encounter: Secondary | ICD-10-CM | POA: Diagnosis not present

## 2024-01-03 DIAGNOSIS — D485 Neoplasm of uncertain behavior of skin: Secondary | ICD-10-CM | POA: Diagnosis not present

## 2024-01-03 DIAGNOSIS — Z85828 Personal history of other malignant neoplasm of skin: Secondary | ICD-10-CM | POA: Diagnosis not present

## 2024-01-03 DIAGNOSIS — C44719 Basal cell carcinoma of skin of left lower limb, including hip: Secondary | ICD-10-CM | POA: Diagnosis not present

## 2024-01-03 DIAGNOSIS — L820 Inflamed seborrheic keratosis: Secondary | ICD-10-CM

## 2024-01-03 DIAGNOSIS — C44311 Basal cell carcinoma of skin of nose: Secondary | ICD-10-CM

## 2024-01-03 NOTE — Progress Notes (Unsigned)
 New Patient Visit   Subjective  Bethany Carpenter is a 89 y.o. female who presents for the following: spot of concern. Lesion on her left lower leg, present for several years, pink scaly and not previously treated.   Lesion on her nose, gritty and tender and not previously treated, present for several months.   Previous hx of [facility]  The following portions of the chart were reviewed this encounter and updated as appropriate: medications, allergies, medical history  Review of Systems:  No other skin or systemic complaints except as noted in HPI or Assessment and Plan.  Objective  Well appearing patient in no apparent distress; mood and affect are within normal limits.  A focused examination was performed of the following areas: Face Lower legs  Relevant exam findings are noted in the Assessment and Plan.  Mid Tip of Nose 5 mm hyperkeratotic papule  Left Lower Leg - Anterior 1.3 cm pink scaly plaque  Left Malar Cheek Inflamed stuck on plaque Dorsum of Nose, Mid Supratip of Nose Erythematous thin papules/macules with gritty scale.   Assessment & Plan   NEOPLASM OF UNCERTAIN BEHAVIOR OF SKIN (2) Mid Tip of Nose Skin / nail biopsy Type of biopsy: tangential   Informed consent: discussed and consent obtained   Timeout: patient name, date of birth, surgical site, and procedure verified   Procedure prep:  Patient was prepped and draped in usual sterile fashion Prep type:  Isopropyl alcohol Anesthesia: the lesion was anesthetized in a standard fashion   Anesthetic:  1% lidocaine  w/ epinephrine 1-100,000 buffered w/ 8.4% NaHCO3 Instrument used: DermaBlade   Hemostasis achieved with: aluminum chloride   Outcome: patient tolerated procedure well   Post-procedure details: sterile dressing applied and wound care instructions given   Dressing type: petrolatum gauze and bandage    Specimen 1 - Surgical pathology Differential Diagnosis: r/o MNSC vs other  Check Margins: No Left Lower  Leg - Anterior Skin / nail biopsy Type of biopsy: tangential   Informed consent: discussed and consent obtained   Timeout: patient name, date of birth, surgical site, and procedure verified   Procedure prep:  Patient was prepped and draped in usual sterile fashion Prep type:  Isopropyl alcohol Anesthesia: the lesion was anesthetized in a standard fashion   Anesthetic:  1% lidocaine  w/ epinephrine 1-100,000 buffered w/ 8.4% NaHCO3 Instrument used: DermaBlade   Hemostasis achieved with: aluminum chloride   Outcome: patient tolerated procedure well   Post-procedure details: sterile dressing applied and wound care instructions given   Dressing type: petrolatum gauze and bandage    Specimen 2 - Surgical pathology Differential Diagnosis: r/o NMSC vs other  Check Margins: No INFLAMED SEBORRHEIC KERATOSIS Left Malar Cheek AK (ACTINIC KERATOSIS) (2) Dorsum of Nose, Mid Supratip of Nose Destruction of lesion - Dorsum of Nose, Mid Supratip of Nose Complexity: simple   Destruction method: cryotherapy   Informed consent: discussed and consent obtained   Timeout:  patient name, date of birth, surgical site, and procedure verified Lesion destroyed using liquid nitrogen: Yes   Region frozen until ice ball extended beyond lesion: Yes   Cryotherapy cycles:  2 Outcome: patient tolerated procedure well with no complications   Post-procedure details: wound care instructions given    HISTORY OF NONMELANOMA SKIN CANCER    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  - Call if any new or changing lesions are noted between office visits  Return for based on biopsy results.  I, Berwyn Lesches, Surg Tech III, am acting as scribe for RUFUS CHRISTELLA HOLY, MD.   Documentation: I have reviewed the above documentation for  accuracy and completeness, and I agree with the above.  RUFUS CHRISTELLA HOLY, MD

## 2024-01-03 NOTE — Patient Instructions (Addendum)
 For areas treated with Liquid Nitrogen:  Keep clean with soap and water.  Apply Vaseline or Aquaphor twice daily.  Patient Handout: Wound Care for Skin Biopsy Site  Taking Care of Your Skin Biopsy Site  Proper care of the biopsy site is essential for promoting healing and minimizing scarring. This handout provides instructions on how to care for your biopsy site to ensure optimal recovery.  1. Cleaning the Wound:  Clean the biopsy site daily with gentle soap and water. Gently pat the area dry with a clean, soft towel. Avoid harsh scrubbing or rubbing the area, as this can irritate the skin and delay healing.  2. Applying Aquaphor and Bandage:  After cleaning the wound, apply a thin layer of Aquaphor ointment to the biopsy site. Cover the area with a sterile bandage to protect it from dirt, bacteria, and friction. Change the bandage daily or as needed if it becomes soiled or wet.  3. Continued Care for One Week:  Repeat the cleaning, Aquaphor application, and bandaging process daily for one week following the biopsy procedure. Keeping the wound clean and moist during this initial healing period will help prevent infection and promote optimal healing.  4. Massaging Aquaphor into the Area:  ---After one week, discontinue the use of bandages but continue to apply Aquaphor to the biopsy site. ----Gently massage the Aquaphor into the area using circular motions. ---Massaging the skin helps to promote circulation and prevent the formation of scar tissue.   Additional Tips:  Avoid exposing the biopsy site to direct sunlight during the healing process, as this can cause hyperpigmentation or worsen scarring. If you experience any signs of infection, such as increased redness, swelling, warmth, or drainage from the wound, contact your healthcare provider immediately. Follow any additional instructions provided by your healthcare provider for caring for the biopsy site and managing any  discomfort. Conclusion:  Taking proper care of your skin biopsy site is crucial for ensuring optimal healing and minimizing scarring. By following these instructions for cleaning, applying Aquaphor, and massaging the area, you can promote a smooth and successful recovery. If you have any questions or concerns about caring for your biopsy site, don't hesitate to contact your healthcare provider for guidance.   Important Information  Due to recent changes in healthcare laws, you may see results of your pathology and/or laboratory studies on MyChart before the doctors have had a chance to review them. We understand that in some cases there may be results that are confusing or concerning to you. Please understand that not all results are received at the same time and often the doctors may need to interpret multiple results in order to provide you with the best plan of care or course of treatment. Therefore, we ask that you please give us  2 business days to thoroughly review all your results before contacting the office for clarification. Should we see a critical lab result, you will be contacted sooner.   If You Need Anything After Your Visit  If you have any questions or concerns for your doctor, please call our main line at [phone] If no one answers, please leave a voicemail as directed and we will return your call as soon as possible. Messages left after 4 pm will be answered the following business day.   You may also send us  a message via MyChart. We typically respond to MyChart messages within 1-2 business days.  For prescription refills, please ask your pharmacy to contact our office. Our fax number is  [phone].  If you have an urgent issue when the clinic is closed that cannot wait until the next business day, you can page your doctor at the number below.    Please note that while we do our best to be available for urgent issues outside of office hours, we are not available 24/7.    If you have an urgent issue and are unable to reach us , you may choose to seek medical care at your doctor's office, retail clinic, urgent care center, or emergency room.  If you have a medical emergency, please immediately call 911 or go to the emergency department. In the event of inclement weather, please call our main line at [phone] for an update on the status of any delays or closures.  Dermatology Medication Tips: Please keep the boxes that topical medications come in in order to help keep track of the instructions about where and how to use these. Pharmacies typically print the medication instructions only on the boxes and not directly on the medication tubes.   If your medication is too expensive, please contact our office at [phone] or send us  a message through MyChart.   We are unable to tell what your co-pay for medications will be in advance as this is different depending on your insurance coverage. However, we may be able to find a substitute medication at lower cost or fill out paperwork to get insurance to cover a needed medication.   If a prior authorization is required to get your medication covered by your insurance company, please allow us  1-2 business days to complete this process.  Drug prices often vary depending on where the prescription is filled and some pharmacies may offer cheaper prices.  The website www.goodrx.com contains coupons for medications through different pharmacies. The prices here do not account for what the cost may be with help from insurance (it may be cheaper with your insurance), but the website can give you the price if you did not use any insurance.  - You can print the associated coupon and take it with your prescription to the pharmacy.  - You may also stop by our office during regular business hours and pick up a GoodRx coupon card.  - If you need your prescription sent electronically to a different pharmacy, notify our office  through [facility] or by phone at [phone]    Skin Education :   I counseled the patient regarding the following: Sun screen (SPF 30 or greater) should be applied during peak UV exposure (between 10am and 2pm) and reapplied after exercise or swimming.  The ABCDEs of melanoma were reviewed with the patient, and the importance of monthly self-examination of moles was emphasized. Should any moles change in shape or color, or itch, bleed or burn, pt will contact our office for evaluation sooner then their interval appointment.  Plan: Sunscreen Recommendations I recommended a broad spectrum sunscreen with a SPF of 30 or higher. I explained that SPF 30 sunscreens block approximately 97 percent of the sun's harmful rays. Sunscreens should be applied at least 15 minutes prior to expected sun exposure and then every 2 hours after that as long as sun exposure continues. If swimming or exercising sunscreen should be reapplied every 45 minutes to an hour after getting wet or sweating. One ounce, or the equivalent of a shot glass full of sunscreen, is adequate to protect the skin not covered by a bathing suit. I also recommended a lip balm with a sunscreen as  well. Sun protective clothing can be used in lieu of sunscreen but must be worn the entire time you are exposed to the sun's rays.

## 2024-01-05 ENCOUNTER — Ambulatory Visit: Payer: Self-pay | Admitting: Dermatology

## 2024-01-05 LAB — SURGICAL PATHOLOGY

## 2024-01-05 NOTE — Progress Notes (Signed)
 Remote PPM Transmission

## 2024-01-19 ENCOUNTER — Encounter: Payer: Self-pay | Admitting: Physician Assistant

## 2024-01-19 ENCOUNTER — Ambulatory Visit: Admitting: Physician Assistant

## 2024-01-19 VITALS — BP 148/80 | HR 73 | Temp 97.4°F | Ht 61.0 in | Wt 111.2 lb

## 2024-01-19 DIAGNOSIS — R7303 Prediabetes: Secondary | ICD-10-CM | POA: Diagnosis not present

## 2024-01-19 DIAGNOSIS — I10 Essential (primary) hypertension: Secondary | ICD-10-CM

## 2024-01-19 DIAGNOSIS — M542 Cervicalgia: Secondary | ICD-10-CM | POA: Diagnosis not present

## 2024-01-19 DIAGNOSIS — C44311 Basal cell carcinoma of skin of nose: Secondary | ICD-10-CM | POA: Diagnosis not present

## 2024-01-19 DIAGNOSIS — G8929 Other chronic pain: Secondary | ICD-10-CM | POA: Diagnosis not present

## 2024-01-19 LAB — POCT GLYCOSYLATED HEMOGLOBIN (HGB A1C): Hemoglobin A1C: 5.7 % — AB (ref 4.0–5.6)

## 2024-01-19 MED ORDER — TRAMADOL HCL 50 MG PO TABS: 50.0000 mg | ORAL_TABLET | Freq: Four times a day (QID) | ORAL | 0 refills | Status: DC | PRN

## 2024-01-19 NOTE — Progress Notes (Signed)
 Pt was given bx results and sched appts

## 2024-01-19 NOTE — Progress Notes (Signed)
 Patient ID: Bethany Carpenter, female    DOB: [birth year], 89 y.o.   MRN: [id number]   Assessment & Plan:  Essential hypertension  Chronic neck pain  Cervicalgia -     traMADol  HCl; Take 1 tablet (50 mg total) by mouth every 6 (six) hours as needed for moderate pain (pain score 4-6).  Dispense: 120 tablet; Refill: 0  Prediabetes -     POCT glycosylated hemoglobin (Hb A1C)  Basal cell carcinoma of nose     Assessment & Plan Chronic neck pain Chronic neck pain, well-managed on tramadol  50 mg four times daily. Occasional exacerbations occur without new neurological symptoms. Numbness is attributed to a previous neck injury. Exercise alleviates symptoms. - Continue tramadol  50 mg four times daily - Discuss potential referral to sports medicine or neurosurgery if symptoms worsen - Encourage regular exercise as tolerated  Basal cell carcinoma of nose and left leg Basal cell carcinoma confirmed on nasal tip and left leg. Dermatology recommends Mohs surgery for both sites. Communication issues with scheduling the procedure persist. - Assist in scheduling Mohs surgery with dermatology - Secure chat with dermatology to facilitate scheduling  Hypertension Blood pressure elevated today, likely due to white coat syndrome. Home blood pressure monitoring is stable. Currently on carvedilol  12.5 mg twice daily and lisinopril  10 mg BID. - Continue current antihypertensive regimen - Monitor blood pressure at home  Prediabetes Previous A1c was 6.2, now improved to 5.7. She manages sugar intake effectively and is not currently diabetic. - Continue monitoring A1c every 6 to 12 months  General Health Maintenance She is up to date with flu vaccination. Plans to receive COVID-19 and tetanus vaccinations together. - Administer COVID-19 and tetanus vaccinations together      Return in about 3 months (around 04/20/2024) for recheck/follow-up.    Subjective:    Chief Complaint  Patient  presents with   Medical Management of Chronic Issues    Pt in office for 3 mon f/u and med refills;     HPI Discussed the use of AI scribe software for clinical note transcription with the patient, who gave verbal consent to proceed.  History of Present Illness Bethany Carpenter is an 89 year old female who presents for a regular three-month follow-up visit.  She experiences chronic neck pain, managed with tramadol  50 mg four times daily. The pain is generally stable on this dose, though she occasionally experiences exacerbations. Exercise, despite causing some discomfort, helps alleviate the pain, and she uses a heating pad for additional relief. She experiences occasional numbness, which she attributes to a neck injury from ten years ago.  She has a history of basal cell carcinoma on her nose. Recently, a dermatologist used liquid nitrogen on her nose, and she is awaiting Mohs surgery for the carcinoma on her nasal tip. She has been experiencing communication issues with the dermatology office regarding scheduling the surgery.  Her blood pressure is typically elevated during office visits, which she attributes to 'white coat syndrome'. At home, she monitors her blood pressure and takes carvedilol  12.5 mg twice daily.  Previously concerned about her blood sugar levels, which were nearing diabetic levels, recent tests show improvement. She manages her sugar intake by reducing sweets and using sweeteners in her coffee. Her A1c has improved from 6.2 to 5.7.  She takes vitamin B12 every other day, having purchased a 1000 mg supplement.  She mentions issues with her vision and plans to visit the eye doctor next week as she feels her  prescription may need updating. She wears glasses for driving.  Socially, [place] street with no sidewalks, which limits her walking. She manages her household and is dealing with a pest issue, specifically mice, which has been a source of stress.     Past  Medical History:  Diagnosis Date   Allergy    Arthritis    Cancer (HCC)    Skin   Colon polyps    Heart disease    History of chickenpox    Hyperlipidemia    Hypertension    Presence of permanent cardiac pacemaker 01/12/2019    Past Surgical History:  Procedure Laterality Date   ANGIOPLASTY  03/08/2007   BREAST BIOPSY  09/28/2003   CATARACT EXTRACTION  11/25/2005   04/07/2006   CERVICAL SPINE SURGERY  11/24/2012   02/02/2008   EYE SURGERY     INSERT / REPLACE / REMOVE PACEMAKER  01/12/2019   PACEMAKER IMPLANT N/A 01/12/2019   Procedure: PACEMAKER IMPLANT;  Surgeon: Kelsie Agent, MD;  Location: [facility];  Service: Cardiovascular;  Laterality: N/A;   SPINE SURGERY     TONSILLECTOMY  1952   TUBAL LIGATION  1970    Family History  Problem Relation Age of Onset   Hearing loss Mother    Hypertension Mother    Osteoporosis Mother    Arthritis Mother    Cancer Father        Skin   Hearing loss Father    Arthritis Father    Diabetes Brother    Stroke Brother    Early death Brother    Early death Maternal Grandfather    Early death Paternal Grandfather    Depression Daughter    Early death Son    Breast cancer Neg Hx    Ovarian cancer Neg Hx     Social History   Tobacco Use   Smoking status: Never   Smokeless tobacco: Never  Vaping Use   Vaping status: Never Used  Substance Use Topics   Alcohol use: No   Drug use: No     Allergies  Allergen Reactions   Other Hives and Itching    Fragrances   Nickel    Penicillins Rash    Did it involve swelling of the face/tongue/throat, SOB, or low BP? No Did it involve sudden or severe rash/hives, skin peeling, or any reaction on the inside of your mouth or nose? Yes Did you need to seek medical attention at a hospital or doctor's office? Yes When did it last happen?      years ago  If all above answers are "NO", may proceed with cephalosporin use.    Sulfa Antibiotics Rash    Review of  Systems NEGATIVE UNLESS OTHERWISE INDICATED IN HPI      Objective:     BP (!) 148/80 (BP Location: Left Arm, Patient Position: Sitting, Cuff Size: Normal)   Pulse 73   Temp (!) 97.4 F (36.3 C) (Temporal)   Ht 5' 1 (1.549 m)   Wt 111 lb 3.2 oz (50.4 kg)   SpO2 97%   BMI 21.01 kg/m   Wt Readings from Last 3 Encounters:  01/19/24 111 lb 3.2 oz (50.4 kg)  10/19/23 113 lb 6.4 oz (51.4 kg)  07/19/23 114 lb (51.7 kg)    BP Readings from Last 3 Encounters:  01/19/24 (!) 148/80  10/19/23 138/78  07/19/23 (!) 160/70     Physical Exam Vitals and nursing note reviewed.  Constitutional:  General: She is not in acute distress.    Appearance: Normal appearance. She is not ill-appearing.  HENT:     Head: Normocephalic.  Neck:     Vascular: No carotid bruit.  Cardiovascular:     Rate and Rhythm: Normal rate and regular rhythm.     Comments: pacemaker Pulmonary:     Effort: Pulmonary effort is normal.     Breath sounds: Normal breath sounds.  Musculoskeletal:     Left shoulder: Decreased range of motion (external rotation limited).     Cervical back: Rigidity (chronic pain and spasm) present.     Comments: Arthritis changes noted in fingers   Lymphadenopathy:     Cervical: No cervical adenopathy.  Skin:    General: Skin is warm.     Findings: No lesion.  Neurological:     Mental Status: She is alert.  Psychiatric:        Mood and Affect: Mood normal.        Behavior: Behavior normal.             Rondey Fallen M Arjun Hard, PA-C

## 2024-01-26 DIAGNOSIS — H5051 Esophoria: Secondary | ICD-10-CM | POA: Diagnosis not present

## 2024-01-26 DIAGNOSIS — Z0100 Encounter for examination of eyes and vision without abnormal findings: Secondary | ICD-10-CM | POA: Diagnosis not present

## 2024-01-26 DIAGNOSIS — D3132 Benign neoplasm of left choroid: Secondary | ICD-10-CM | POA: Diagnosis not present

## 2024-01-26 DIAGNOSIS — D3131 Benign neoplasm of right choroid: Secondary | ICD-10-CM | POA: Diagnosis not present

## 2024-01-26 DIAGNOSIS — H524 Presbyopia: Secondary | ICD-10-CM | POA: Diagnosis not present

## 2024-02-01 ENCOUNTER — Ambulatory Visit (INDEPENDENT_AMBULATORY_CARE_PROVIDER_SITE_OTHER): Payer: Medicare HMO

## 2024-02-01 DIAGNOSIS — I442 Atrioventricular block, complete: Secondary | ICD-10-CM | POA: Diagnosis not present

## 2024-02-02 LAB — CUP PACEART REMOTE DEVICE CHECK
Battery Remaining Longevity: 72 mo
Battery Voltage: 2.97 V
Brady Statistic AP VP Percent: 12.51 %
Brady Statistic AP VS Percent: 0 %
Brady Statistic AS VP Percent: 87.42 %
Brady Statistic AS VS Percent: 0.07 %
Brady Statistic RA Percent Paced: 12.51 %
Brady Statistic RV Percent Paced: 99.93 %
Date Time Interrogation Session: 20251027213544
Implantable Lead Connection Status: 753985
Implantable Lead Connection Status: 753985
Implantable Lead Implant Date: 20201008
Implantable Lead Implant Date: 20201008
Implantable Lead Location: 753859
Implantable Lead Location: 753860
Implantable Lead Model: 5076
Implantable Lead Model: 5076
Implantable Pulse Generator Implant Date: 20201008
Lead Channel Impedance Value: 228 Ohm
Lead Channel Impedance Value: 285 Ohm
Lead Channel Impedance Value: 323 Ohm
Lead Channel Impedance Value: 399 Ohm
Lead Channel Pacing Threshold Amplitude: 0.625 V
Lead Channel Pacing Threshold Amplitude: 0.625 V
Lead Channel Pacing Threshold Pulse Width: 0.4 ms
Lead Channel Pacing Threshold Pulse Width: 0.4 ms
Lead Channel Sensing Intrinsic Amplitude: 1.25 mV
Lead Channel Sensing Intrinsic Amplitude: 1.25 mV
Lead Channel Sensing Intrinsic Amplitude: 14.5 mV
Lead Channel Sensing Intrinsic Amplitude: 14.5 mV
Lead Channel Setting Pacing Amplitude: 1.5 V
Lead Channel Setting Pacing Amplitude: 2.5 V
Lead Channel Setting Pacing Pulse Width: 0.4 ms
Lead Channel Setting Sensing Sensitivity: 5.6 mV
Zone Setting Status: 755011
Zone Setting Status: 755011

## 2024-02-03 ENCOUNTER — Ambulatory Visit: Payer: Self-pay | Admitting: Cardiology

## 2024-02-08 ENCOUNTER — Ambulatory Visit: Payer: Medicare HMO

## 2024-02-08 VITALS — BP 138/78 | HR 78 | Temp 97.3°F | Ht 61.0 in | Wt 110.0 lb

## 2024-02-08 DIAGNOSIS — Z0000 Encounter for general adult medical examination without abnormal findings: Secondary | ICD-10-CM | POA: Diagnosis not present

## 2024-02-08 NOTE — Progress Notes (Signed)
 Remote PPM Transmission

## 2024-02-08 NOTE — Progress Notes (Signed)
 Subjective:   Bethany Carpenter is a 89 y.o. female who presents for a Medicare Annual Wellness Visit.  Allergies (verified) Other, Nickel, Penicillins, and Sulfa antibiotics   History: Past Medical History:  Diagnosis Date   Allergy    Arthritis    Cancer (HCC)    Skin   Colon polyps    Heart disease    History of chickenpox    Hyperlipidemia    Hypertension    Presence of permanent cardiac pacemaker 01/12/2019   Past Surgical History:  Procedure Laterality Date   ANGIOPLASTY  03/08/2007   BREAST BIOPSY  09/28/2003   CATARACT EXTRACTION  11/25/2005   04/07/2006   CERVICAL SPINE SURGERY  11/24/2012   02/02/2008   EYE SURGERY     INSERT / REPLACE / REMOVE PACEMAKER  01/12/2019   PACEMAKER IMPLANT N/A 01/12/2019   Procedure: PACEMAKER IMPLANT;  Surgeon: Kelsie Agent, MD;  Location: [facility];  Service: Cardiovascular;  Laterality: N/A;   SPINE SURGERY     TONSILLECTOMY  1952   TUBAL LIGATION  1970   Family History  Problem Relation Age of Onset   Hearing loss Mother    Hypertension Mother    Osteoporosis Mother    Arthritis Mother    Cancer Father        Skin   Hearing loss Father    Arthritis Father    Diabetes Brother    Stroke Brother    Early death Brother    Early death Maternal Grandfather    Early death Paternal Grandfather    Depression Daughter    Early death Son    Breast cancer Neg Hx    Ovarian cancer Neg Hx    Social History   Occupational History   Occupation: Retired  Tobacco Use   Smoking status: Never   Smokeless tobacco: Never  Vaping Use   Vaping status: Never Used  Substance and Sexual Activity   Alcohol use: No   Drug use: No   Sexual activity: Not Currently    Birth control/protection: Post-menopausal    Comment: less than 5 sexual partner intercourse at age 74   Tobacco Counseling Counseling given: Not Answered  SDOH Screenings   Food Insecurity: No Food Insecurity (02/08/2024)  Housing: Unknown (02/08/2024)   Transportation Needs: No Transportation Needs (02/08/2024)  Utilities: Not At Risk (02/08/2024)  Alcohol Screen: Low Risk  (12/28/2020)  Depression (PHQ2-9): Low Risk  (02/08/2024)  Financial Resource Strain: Low Risk  (01/17/2024)  Physical Activity: Insufficiently Active (02/08/2024)  Social Connections: Socially Isolated (02/08/2024)  Stress: No Stress Concern Present (02/08/2024)  Tobacco Use: Low Risk  (02/08/2024)  Health Literacy: Adequate Health Literacy (02/08/2024)   Depression Screen    02/08/2024    9:40 AM 10/19/2023    9:32 AM 07/19/2023   12:53 PM 02/02/2023    1:36 PM 10/29/2022   12:54 PM 10/14/2022   11:30 AM 01/23/2022   11:42 AM  PHQ 2/9 Scores  PHQ - 2 Score 0 0 0 0 0 0 1  PHQ- 9 Score  0   0 0      Goals Addressed               This Visit's Progress     being able to take care of myself (pt-stated)        Being able to care for myself        Visit info / Clinical Intake: Medicare Wellness Visit Type:: Subsequent Annual Wellness Visit  Medicare Wellness Visit Mode:: In-person (required for WTM) Interpreter Needed?: No Pre-visit prep was completed: yes AWV questionnaire completed by patient prior to visit?: yes Date:: 02/07/24 Living arrangements:: (!) lives alone Patient's Overall Health Status Rating: (!) fair Typical amount of pain: some Does pain affect daily life?: (!) yes Are you currently prescribed opioids?: (!) yes  Dietary Habits and Nutritional Risks How many meals a day?: 5 Eats fruit and vegetables daily?: (!) no (not many fruits) Most meals are obtained by: preparing own meals Diabetic:: no  Functional Status Activities of Daily Living (to include ambulation/medication): (Patient-Rptd) Independent Ambulation: Independent with device- listed below Home Assistive Devices/Equipment: Eyeglasses Medication Administration: Independent Home Management: (Patient-Rptd) Independent Manage your own finances?: yes Primary transportation is:  driving Concerns about vision?: no *vision screening is required for WTM* (wears glasses)  Fall Screening Falls in the past year?: (Patient-Rptd) 0 Number of falls in past year: 0 Was there an injury with Fall?: 0 Fall Risk Category Calculator: 0 Patient Fall Risk Level: Low Fall Risk  Fall Risk Patient at Risk for Falls Due to: No Fall Risks Fall risk Follow up: Falls prevention discussed  Home and Transportation Safety: All rugs have non-skid backing?: yes All stairs or steps have railings?: N/A, no stairs Grab bars in the bathtub or shower?: yes Have non-skid surface in bathtub or shower?: yes Good home lighting?: yes Regular seat belt use?: yes Hospital stays in the last year:: no  Cognitive Assessment Difficulty concentrating, remembering, or making decisions? : no Will 6CIT or Mini Cog be Completed: no 6CIT or Mini Cog Declined: patient alert, oriented, able to answer questions appropriately and recall recent events  Advance Directives (For Healthcare) Does Patient Have a Medical Advance Directive?: Yes Type of Advance Directive: Healthcare Power of Attorney  Reviewed/Updated  Reviewed/Updated: All        Objective:    Today's Vitals   02/08/24 0932  BP: 138/78  Pulse: 78  Temp: (!) 97.3 F (36.3 C)  SpO2: 96%  Weight: 110 lb (49.9 kg)  Height: 5' 1 (1.549 m)  PainSc: 5   PainLoc: Back   Body mass index is 20.78 kg/m.  Current Medications (verified) Outpatient Encounter Medications as of 02/08/2024  Medication Sig   Acetaminophen  (ARTHRITIS PAIN RELIEF PO) Take 500 mg by mouth.   Calcium-Magnesium-Vitamin D  (CITRACAL CALCIUM+D) 600-40-500 MG-MG-UNIT TB24 Take 2 tablets by mouth daily.   carvedilol  (COREG ) 12.5 MG tablet TAKE 1 TABLET BY MOUTH TWICE A DAY WITH FOOD   Cyanocobalamin  (B-12) 1000 MCG CAPS Take 1 capsule by mouth daily.   fluticasone  (FLONASE ) 50 MCG/ACT nasal spray Place 1 spray into both nostrils daily.   ketotifen  (ZADITOR ) 0.035 %  ophthalmic solution 1 drop in the morning and at bedtime.   lisinopril  (ZESTRIL ) 10 MG tablet TAKE 1 TABLET BY MOUTH TWICE A DAY   simvastatin  (ZOCOR ) 40 MG tablet TAKE 1 TABLET BY MOUTH EVERY DAY   traMADol  (ULTRAM ) 50 MG tablet Take 1 tablet (50 mg total) by mouth every 6 (six) hours as needed for moderate pain (pain score 4-6).   No facility-administered encounter medications on file as of 02/08/2024.   Hearing/Vision screen Hearing Screening - Comments:: Wears hearing aids  Vision Screening - Comments:: Wears rx glasses - up to date with routine eye exams with [facility] Dr Raelyn  Immunizations and Health Maintenance Health Maintenance  Topic Date Due   Zoster Vaccines- Shingrix (1 of 2) Never done   COVID-19 Vaccine (8 - 2025-26  season) 12/06/2023   DTaP/Tdap/Td (3 - Td or Tdap) 01/18/2025 (Originally 04/06/2022)   Medicare Annual Wellness (AWV)  02/07/2025   Pneumococcal Vaccine: 50+ Years  Completed   Influenza Vaccine  Completed   DEXA SCAN  Completed   Meningococcal B Vaccine  Aged Out        Assessment/Plan:  This is a routine wellness examination for [facility].  Patient Care Team: Allwardt, Mardy HERO, PA-C as PCP - General (Physician Assistant) Pandora Cadet, [facility] as Pharmacist (Pharmacist) Glennon Almarie POUR, MD as Consulting Physician (Obstetrics and Gynecology)  I have personally reviewed and noted the following in the patient's chart:   Medical and social history Use of alcohol, tobacco or illicit drugs  Current medications and supplements including opioid prescriptions. Functional ability and status Nutritional status Physical activity Advanced directives List of other physicians Hospitalizations, surgeries, and ER visits in previous 12 months Vitals Screenings to include cognitive, depression, and falls Referrals and appointments  No orders of the defined types were placed in this encounter.  In addition, I have reviewed and discussed with patient certain  preventive protocols, quality metrics, and best practice recommendations. A written personalized care plan for preventive services as well as general preventive health recommendations were provided to patient.   Ellouise VEAR Haws, LPN   88/08/7972   Return in 1 year (on 02/07/2025).  After Visit Summary: (In Person-Printed) AVS printed and given to the patient  Nurse Notes: nothing significant at this time

## 2024-02-08 NOTE — Patient Instructions (Signed)
 Ms. Gruenwald,  Thank you for taking the time for your Medicare Wellness Visit. I appreciate your continued commitment to your health goals. Please review the care plan we discussed, and feel free to reach out if I can assist you further.  Please note that Annual Wellness Visits do not include a physical exam. Some assessments may be limited, especially if the visit was conducted virtually. If needed, we may recommend an in-person follow-up with your provider.  Ongoing Care Seeing your primary care provider every 3 to 6 months helps us  monitor your health and provide consistent, personalized care.   Referrals If a referral was made during today's visit and you haven't received any updates within two weeks, please contact the referred provider directly to check on the status.  Recommended Screenings:  Health Maintenance  Topic Date Due   Zoster (Shingles) Vaccine (1 of 2) Never done   COVID-19 Vaccine (8 - 2025-26 season) 12/06/2023   DTaP/Tdap/Td vaccine (3 - Td or Tdap) 01/18/2025*   Medicare Annual Wellness Visit  02/07/2025   Pneumococcal Vaccine for age over 81  Completed   Flu Shot  Completed   DEXA scan (bone density measurement)  Completed   Meningitis B Vaccine  Aged Out  *Topic was postponed. The date shown is not the original due date.       02/02/2023    1:37 PM  Advanced Directives  Does Patient Have a Medical Advance Directive? Yes  Type of Estate Agent of [place];Living will  Does patient want to make changes to medical advance directive? No - Patient declined  Copy of Healthcare Power of Attorney in Chart? Yes - validated most recent copy scanned in chart (See row information)    Vision: Annual vision screenings are recommended for early detection of glaucoma, cataracts, and diabetic retinopathy. These exams can also reveal signs of chronic conditions such as diabetes and high blood pressure.  Dental: Annual dental screenings help detect  early signs of oral cancer, gum disease, and other conditions linked to overall health, including heart disease and diabetes.  Please see the attached documents for additional preventive care recommendations.

## 2024-02-12 ENCOUNTER — Other Ambulatory Visit: Payer: Self-pay | Admitting: Physician Assistant

## 2024-02-12 DIAGNOSIS — I10 Essential (primary) hypertension: Secondary | ICD-10-CM

## 2024-02-19 ENCOUNTER — Other Ambulatory Visit: Payer: Self-pay | Admitting: Physician Assistant

## 2024-02-19 DIAGNOSIS — E785 Hyperlipidemia, unspecified: Secondary | ICD-10-CM

## 2024-02-21 ENCOUNTER — Other Ambulatory Visit: Payer: Self-pay | Admitting: Physician Assistant

## 2024-02-21 DIAGNOSIS — M542 Cervicalgia: Secondary | ICD-10-CM

## 2024-02-21 NOTE — Telephone Encounter (Signed)
 Last OV: 01/19/24  Next OV: 04/21/24  Last filled: 01/19/24  Quantity: 120

## 2024-02-23 ENCOUNTER — Encounter: Admitting: Dermatology

## 2024-02-28 ENCOUNTER — Ambulatory Visit: Admitting: Dermatology

## 2024-03-08 ENCOUNTER — Encounter: Admitting: Dermatology

## 2024-03-22 ENCOUNTER — Other Ambulatory Visit: Payer: Self-pay | Admitting: Physician Assistant

## 2024-03-22 DIAGNOSIS — M542 Cervicalgia: Secondary | ICD-10-CM

## 2024-03-22 NOTE — Telephone Encounter (Signed)
 Last OV: 01/19/24  Next OV: 04/21/24  Last filled: 02/21/24  Quantity: 120

## 2024-04-21 ENCOUNTER — Ambulatory Visit: Admitting: Physician Assistant

## 2024-04-21 ENCOUNTER — Encounter: Payer: Self-pay | Admitting: Physician Assistant

## 2024-04-21 VITALS — BP 158/80 | HR 73 | Temp 97.2°F | Ht 61.0 in | Wt 111.6 lb

## 2024-04-21 DIAGNOSIS — M542 Cervicalgia: Secondary | ICD-10-CM

## 2024-04-21 DIAGNOSIS — I10 Essential (primary) hypertension: Secondary | ICD-10-CM | POA: Diagnosis not present

## 2024-04-21 DIAGNOSIS — I442 Atrioventricular block, complete: Secondary | ICD-10-CM | POA: Diagnosis not present

## 2024-04-21 DIAGNOSIS — C44311 Basal cell carcinoma of skin of nose: Secondary | ICD-10-CM

## 2024-04-21 MED ORDER — TRAMADOL HCL 50 MG PO TABS: 50.0000 mg | ORAL_TABLET | Freq: Four times a day (QID) | ORAL | 0 refills | Status: AC | PRN

## 2024-04-21 NOTE — Progress Notes (Signed)
 "   Patient ID: Bethany Carpenter, female    DOB: [birth year], 89 y.o.   MRN: [id number]   Assessment & Plan:  Essential hypertension  Cervicalgia -     traMADol  HCl; Take 1 tablet (50 mg total) by mouth every 6 (six) hours as needed for moderate pain (pain score 4-6).  Dispense: 120 tablet; Refill: 0 -     traMADol  HCl; Take 1 tablet (50 mg total) by mouth every 6 (six) hours as needed for moderate pain (pain score 4-6).  Dispense: 120 tablet; Refill: 0 -     traMADol  HCl; Take 1 tablet (50 mg total) by mouth every 6 (six) hours as needed for moderate pain (pain score 4-6).  Dispense: 120 tablet; Refill: 0  Basal cell carcinoma of nose  CHB (complete heart block) (HCC)     Assessment & Plan Chronic cervicalgia Managed with tramadol  50 mg four times daily. Pain exacerbated by dampness and cold weather. Uses microwaveable heating pad for relief. - Continue tramadol  50 mg four times daily - Sent three prescriptions for tramadol  to pharmacy for January, February, and March - PDMP reviewed today, no red flags, filling appropriately.   Essential hypertension White coat syndrome. Home blood pressure monitoring is well-managed. Currently on carvedilol  12.5 mg BID and lisinopril  10 mg twice daily. - Continue carvedilol  12.5 mg BID - Continue lisinopril  10 mg twice daily - Checked blood pressure during visit  Basal cell carcinoma of nose Basal cell carcinoma on the nose, present for eight years without growth or complications. She opted against dermatological intervention due to personal preference. She is aware of possible risks of opting out of Moh's.   Complete heart block, status post pacemaker Complete heart block with pacemaker placed in October 2020. Regular remote device checks in January, April, July, and October of last year. No current chest pain or issues reported. - Continue regular remote device checks      Return in about 3 months (around 07/20/2024) for  recheck/follow-up.    Subjective:    Chief Complaint  Patient presents with   Medical Management of Chronic Issues    Pt in office for 3 mon f/u and BP check; pt states needs refill of Tramadol  while in office today. No other concerns to discuss    HPI Discussed the use of AI scribe software for clinical note transcription with the patient, who gave verbal consent to proceed.  History of Present Illness Bethany Carpenter is an 89 year old female with chronic cervicogenic pain and hypertension who presents for a three-month follow-up visit.  She experiences chronic cervicogenic pain, which is managed with tramadol  50 mg four times daily. Her neck pain worsens with dampness and cold weather. She finds relief using a microwaveable heating pad, which she prefers over an electric one.  She has hypertension with white coat syndrome. Her home blood pressure monitoring remains stable. She takes carvedilol  12.5 mg daily and lisinopril  10 mg twice daily.  She has a history of complete heart block and had a pacemaker placed in October 2020. She undergoes regular remote device checks, with the last checks occurring in January, April, July, and October of the previous year. No chest pain or related symptoms are reported.  She has a basal cell carcinoma on her nose, which she has decided not to have treated. She feels it has not changed in size or caused any issues over the past eight years.  Socially, she lives alone and has no close relatives nearby. She  maintains contact with two young friends in [place] and [place] and communicates regularly with a neighbor. She enjoys reading and has had issues with mice in her home, which have caused damage to her internet connection.     Past Medical History:  Diagnosis Date   Allergy    Arthritis    Cancer (HCC)    Skin   Colon polyps    Heart disease    History of chickenpox    Hyperlipidemia    Hypertension    Presence of permanent cardiac pacemaker  01/12/2019    Past Surgical History:  Procedure Laterality Date   ANGIOPLASTY  03/08/2007   BREAST BIOPSY  09/28/2003   CATARACT EXTRACTION  11/25/2005   04/07/2006   CERVICAL SPINE SURGERY  11/24/2012   02/02/2008   EYE SURGERY     INSERT / REPLACE / REMOVE PACEMAKER  01/12/2019   PACEMAKER IMPLANT N/A 01/12/2019   Procedure: PACEMAKER IMPLANT;  Surgeon: Kelsie Agent, MD;  Location: [facility];  Service: Cardiovascular;  Laterality: N/A;   SPINE SURGERY     TONSILLECTOMY  1952   TUBAL LIGATION  1970    Family History  Problem Relation Age of Onset   Hearing loss Mother    Hypertension Mother    Osteoporosis Mother    Arthritis Mother    Cancer Father        Skin   Hearing loss Father    Arthritis Father    Diabetes Brother    Stroke Brother    Early death Brother    Early death Maternal Grandfather    Early death Paternal Grandfather    Depression Daughter    Early death Son    Breast cancer Neg Hx    Ovarian cancer Neg Hx     Social History[1]   Allergies[2]  Review of Systems NEGATIVE UNLESS OTHERWISE INDICATED IN HPI      Objective:     BP (!) 158/80 (BP Location: Left Arm, Patient Position: Sitting, Cuff Size: Normal)   Pulse 73   Temp (!) 97.2 F (36.2 C) (Temporal)   Ht 5' 1 (1.549 m)   Wt 111 lb 9.6 oz (50.6 kg)   SpO2 98%   BMI 21.09 kg/m   Wt Readings from Last 3 Encounters:  04/21/24 111 lb 9.6 oz (50.6 kg)  02/08/24 110 lb (49.9 kg)  01/19/24 111 lb 3.2 oz (50.4 kg)    BP Readings from Last 3 Encounters:  04/21/24 (!) 158/80  02/08/24 138/78  01/19/24 (!) 148/80     Physical Exam Vitals and nursing note reviewed.  Constitutional:      General: She is not in acute distress.    Appearance: Normal appearance. She is not ill-appearing.  HENT:     Head: Normocephalic.  Neck:     Vascular: No carotid bruit.  Cardiovascular:     Rate and Rhythm: Normal rate and regular rhythm.     Comments: pacemaker Pulmonary:      Effort: Pulmonary effort is normal.     Breath sounds: Normal breath sounds.  Musculoskeletal:     Left shoulder: Decreased range of motion (external rotation limited).     Cervical back: Rigidity (chronic pain and spasm) present.     Comments: Arthritis changes noted in fingers   Lymphadenopathy:     Cervical: No cervical adenopathy.  Skin:    General: Skin is warm.     Findings: No lesion.  Neurological:     Mental Status:  She is alert.  Psychiatric:        Mood and Affect: Mood normal.        Behavior: Behavior normal.             Mardy Hoppe M Shritha Bresee, PA-C    [1]  Social History Tobacco Use   Smoking status: Never   Smokeless tobacco: Never  Vaping Use   Vaping status: Never Used  Substance Use Topics   Alcohol use: No   Drug use: No  [2]  Allergies Allergen Reactions   Other Hives and Itching    Fragrances   Nickel    Penicillins Rash    Did it involve swelling of the face/tongue/throat, SOB, or low BP? No Did it involve sudden or severe rash/hives, skin peeling, or any reaction on the inside of your mouth or nose? Yes Did you need to seek medical attention at a hospital or doctor's office? Yes When did it last happen?      years ago  If all above answers are NO, may proceed with cephalosporin use.    Sulfa Antibiotics Rash   "

## 2024-04-21 NOTE — Patient Instructions (Signed)
" °  VISIT SUMMARY: During your visit, we discussed your chronic neck pain, hypertension, basal cell carcinoma, and heart block with a pacemaker. We reviewed your current medications and management strategies for each condition.  YOUR PLAN: CHRONIC CERVICALGIA: You have chronic neck pain that worsens with dampness and cold weather. -Continue taking tramadol  50 mg four times daily. -I have sent three prescriptions for tramadol  to the pharmacy for January, February, and March. -Use a microwaveable heating pad for relief as needed.  ESSENTIAL HYPERTENSION: Your blood pressure is well-managed at home despite white coat syndrome. -Continue taking carvedilol  12.5 mg twice daily. -Continue taking lisinopril  10 mg twice daily. -We checked your blood pressure during the visit.  BASAL CELL CARCINOMA OF NOSE: You have a basal cell carcinoma on your nose that has not changed in size or caused issues over the past eight years. -Continue monitoring the basal cell carcinoma as you have opted against treatment.  COMPLETE HEART BLOCK, STATUS POST PACEMAKER: You have a pacemaker due to complete heart block, with regular remote device checks. -Continue regular remote device checks.                      Contains text generated by [employer].                                 Contains text generated by [employer].   "

## 2024-05-02 ENCOUNTER — Ambulatory Visit: Payer: Medicare HMO

## 2024-05-02 DIAGNOSIS — I442 Atrioventricular block, complete: Secondary | ICD-10-CM

## 2024-05-02 LAB — CUP PACEART REMOTE DEVICE CHECK
Battery Remaining Longevity: 68 mo
Battery Voltage: 2.97 V
Brady Statistic AP VP Percent: 14.97 %
Brady Statistic AP VS Percent: 0 %
Brady Statistic AS VP Percent: 84.98 %
Brady Statistic AS VS Percent: 0.04 %
Brady Statistic RA Percent Paced: 14.96 %
Brady Statistic RV Percent Paced: 99.95 %
Date Time Interrogation Session: 20260126234421
Implantable Lead Connection Status: 753985
Implantable Lead Connection Status: 753985
Implantable Lead Implant Date: 20201008
Implantable Lead Implant Date: 20201008
Implantable Lead Location: 753859
Implantable Lead Location: 753860
Implantable Lead Model: 5076
Implantable Lead Model: 5076
Implantable Pulse Generator Implant Date: 20201008
Lead Channel Impedance Value: 247 Ohm
Lead Channel Impedance Value: 285 Ohm
Lead Channel Impedance Value: 323 Ohm
Lead Channel Impedance Value: 399 Ohm
Lead Channel Pacing Threshold Amplitude: 0.5 V
Lead Channel Pacing Threshold Amplitude: 0.875 V
Lead Channel Pacing Threshold Pulse Width: 0.4 ms
Lead Channel Pacing Threshold Pulse Width: 0.4 ms
Lead Channel Sensing Intrinsic Amplitude: 0.875 mV
Lead Channel Sensing Intrinsic Amplitude: 0.875 mV
Lead Channel Sensing Intrinsic Amplitude: 9.375 mV
Lead Channel Sensing Intrinsic Amplitude: 9.375 mV
Lead Channel Setting Pacing Amplitude: 1.5 V
Lead Channel Setting Pacing Amplitude: 2.5 V
Lead Channel Setting Pacing Pulse Width: 0.4 ms
Lead Channel Setting Sensing Sensitivity: 5.6 mV
Zone Setting Status: 755011
Zone Setting Status: 755011

## 2024-05-07 ENCOUNTER — Ambulatory Visit: Payer: Self-pay | Admitting: Cardiology

## 2024-05-11 NOTE — Progress Notes (Signed)
 Remote PPM Transmission

## 2024-07-21 ENCOUNTER — Ambulatory Visit: Admitting: Physician Assistant

## 2024-08-01 ENCOUNTER — Ambulatory Visit: Payer: Medicare HMO

## 2024-10-31 ENCOUNTER — Ambulatory Visit: Payer: Medicare HMO

## 2025-01-30 ENCOUNTER — Ambulatory Visit: Payer: Medicare HMO

## 2025-02-19 ENCOUNTER — Ambulatory Visit
